# Patient Record
Sex: Male | Born: 1935 | Race: White | Hispanic: No | Marital: Married | Smoking: Never smoker
Health system: Southern US, Community
[De-identification: ages and names within clinical notes are randomized; demographics above are authoritative.]

## PROBLEM LIST (undated history)

## (undated) DIAGNOSIS — I2510 Atherosclerotic heart disease of native coronary artery without angina pectoris: Secondary | ICD-10-CM

## (undated) DIAGNOSIS — N529 Male erectile dysfunction, unspecified: Secondary | ICD-10-CM

## (undated) DIAGNOSIS — M1990 Unspecified osteoarthritis, unspecified site: Secondary | ICD-10-CM

## (undated) DIAGNOSIS — K5790 Diverticulosis of intestine, part unspecified, without perforation or abscess without bleeding: Secondary | ICD-10-CM

## (undated) DIAGNOSIS — E079 Disorder of thyroid, unspecified: Secondary | ICD-10-CM

## (undated) DIAGNOSIS — I10 Essential (primary) hypertension: Secondary | ICD-10-CM

## (undated) DIAGNOSIS — K449 Diaphragmatic hernia without obstruction or gangrene: Secondary | ICD-10-CM

## (undated) DIAGNOSIS — K649 Unspecified hemorrhoids: Secondary | ICD-10-CM

## (undated) DIAGNOSIS — L719 Rosacea, unspecified: Secondary | ICD-10-CM

## (undated) DIAGNOSIS — B351 Tinea unguium: Secondary | ICD-10-CM

## (undated) HISTORY — DX: Essential (primary) hypertension: I10

## (undated) HISTORY — DX: Tinea unguium: B35.1

## (undated) HISTORY — DX: Rosacea, unspecified: L71.9

## (undated) HISTORY — DX: Male erectile dysfunction, unspecified: N52.9

## (undated) HISTORY — DX: Diaphragmatic hernia without obstruction or gangrene: K44.9

## (undated) HISTORY — DX: Disorder of thyroid, unspecified: E07.9

## (undated) HISTORY — DX: Unspecified hemorrhoids: K64.9

## (undated) HISTORY — DX: Unspecified osteoarthritis, unspecified site: M19.90

## (undated) HISTORY — DX: Diverticulosis of intestine, part unspecified, without perforation or abscess without bleeding: K57.90

---

## 2011-08-24 ENCOUNTER — Other Ambulatory Visit (HOSPITAL_COMMUNITY): Payer: Self-pay | Admitting: *Deleted

## 2011-08-24 DIAGNOSIS — R942 Abnormal results of pulmonary function studies: Secondary | ICD-10-CM

## 2011-08-31 ENCOUNTER — Encounter (HOSPITAL_COMMUNITY): Payer: Self-pay

## 2014-05-10 ENCOUNTER — Encounter: Payer: Self-pay | Admitting: *Deleted

## 2014-07-09 DIAGNOSIS — L570 Actinic keratosis: Secondary | ICD-10-CM | POA: Diagnosis not present

## 2014-07-09 DIAGNOSIS — D049 Carcinoma in situ of skin, unspecified: Secondary | ICD-10-CM | POA: Diagnosis not present

## 2014-07-09 DIAGNOSIS — D239 Other benign neoplasm of skin, unspecified: Secondary | ICD-10-CM | POA: Diagnosis not present

## 2014-07-14 DIAGNOSIS — R12 Heartburn: Secondary | ICD-10-CM | POA: Diagnosis not present

## 2014-07-14 DIAGNOSIS — J45909 Unspecified asthma, uncomplicated: Secondary | ICD-10-CM | POA: Diagnosis not present

## 2014-07-14 DIAGNOSIS — R05 Cough: Secondary | ICD-10-CM | POA: Diagnosis not present

## 2014-08-04 DIAGNOSIS — D485 Neoplasm of uncertain behavior of skin: Secondary | ICD-10-CM | POA: Diagnosis not present

## 2014-09-29 DIAGNOSIS — Z0001 Encounter for general adult medical examination with abnormal findings: Secondary | ICD-10-CM | POA: Diagnosis not present

## 2014-09-29 DIAGNOSIS — J310 Chronic rhinitis: Secondary | ICD-10-CM | POA: Diagnosis not present

## 2014-09-29 DIAGNOSIS — R12 Heartburn: Secondary | ICD-10-CM | POA: Diagnosis not present

## 2014-09-29 DIAGNOSIS — R05 Cough: Secondary | ICD-10-CM | POA: Diagnosis not present

## 2014-09-29 DIAGNOSIS — I10 Essential (primary) hypertension: Secondary | ICD-10-CM | POA: Diagnosis not present

## 2014-09-29 DIAGNOSIS — B351 Tinea unguium: Secondary | ICD-10-CM | POA: Diagnosis not present

## 2014-09-29 DIAGNOSIS — E785 Hyperlipidemia, unspecified: Secondary | ICD-10-CM | POA: Diagnosis not present

## 2014-09-29 DIAGNOSIS — J45909 Unspecified asthma, uncomplicated: Secondary | ICD-10-CM | POA: Diagnosis not present

## 2014-10-20 DIAGNOSIS — E291 Testicular hypofunction: Secondary | ICD-10-CM | POA: Diagnosis not present

## 2014-11-24 ENCOUNTER — Other Ambulatory Visit: Payer: Self-pay | Admitting: Internal Medicine

## 2014-11-24 ENCOUNTER — Ambulatory Visit
Admission: RE | Admit: 2014-11-24 | Discharge: 2014-11-24 | Disposition: A | Discharge status: Home / Self Care | Payer: Commercial Managed Care - HMO | Source: Ambulatory Visit | Attending: Internal Medicine | Admitting: Internal Medicine

## 2014-11-24 DIAGNOSIS — R05 Cough: Secondary | ICD-10-CM | POA: Diagnosis not present

## 2014-11-24 DIAGNOSIS — R059 Cough, unspecified: Secondary | ICD-10-CM

## 2014-12-10 DIAGNOSIS — R05 Cough: Secondary | ICD-10-CM | POA: Diagnosis not present

## 2015-01-14 ENCOUNTER — Telehealth: Payer: Self-pay | Admitting: Internal Medicine

## 2015-01-14 NOTE — Telephone Encounter (Signed)
error 

## 2015-01-19 ENCOUNTER — Telehealth: Payer: Self-pay | Admitting: Internal Medicine

## 2015-01-19 ENCOUNTER — Encounter: Payer: Self-pay | Admitting: Internal Medicine

## 2015-01-19 ENCOUNTER — Ambulatory Visit (INDEPENDENT_AMBULATORY_CARE_PROVIDER_SITE_OTHER): Payer: Commercial Managed Care - HMO | Admitting: Internal Medicine

## 2015-01-19 VITALS — BP 122/84 | HR 60 | Ht 73.0 in | Wt 195.4 lb

## 2015-01-19 DIAGNOSIS — R05 Cough: Secondary | ICD-10-CM

## 2015-01-19 DIAGNOSIS — I10 Essential (primary) hypertension: Secondary | ICD-10-CM

## 2015-01-19 DIAGNOSIS — R058 Other specified cough: Secondary | ICD-10-CM

## 2015-01-19 MED ORDER — MOMETASONE FURO-FORMOTEROL FUM 100-5 MCG/ACT IN AERO: INHALATION_SPRAY | RESPIRATORY_TRACT | Status: DC

## 2015-01-19 MED ORDER — RANITIDINE HCL 150 MG PO TABS: ORAL_TABLET | ORAL | Status: DC

## 2015-01-19 NOTE — Progress Notes (Signed)
   Subjective:    Patient ID: Gary Riggs, male    DOB: 10/16/1930,    MRN: [id number]  HPI  7 yowm never smoker with indolent onset daily cough x 2012  referred by Dr Mertha Finders 01/19/2015 for pulmonary eval    01/19/2015 1st [facility] office visit/ Gary Riggs   Chief Complaint  Patient presents with  . Pulmonary Consult    Referred by Dr Inda Merlin. Pt c/o non-productive cough x 4 months. Pt states also having acid reflux and does not know if cough is related. Denies SOB, chest pain, tightness or wheezing    cough is daily worse with certain foods/ better with menthol lozenges  Cough is dry and worse around 3 am pretty much every noct  So far no resp to gerd rx/ zyrtec or symb 160    No obvious other patterns in day to day or daytime variabilty or assoc sob or cp or chest tightness, subjective wheeze overt sinus or hb symptoms. No unusual exp hx or h/o childhood pna/ asthma or knowledge of premature birth.  Sleeping ok without nocturnal  or early am exacerbation  of respiratory  c/o's or need for noct saba. Also denies any obvious fluctuation of symptoms with weather or environmental changes or other aggravating or alleviating factors except as outlined above   Current Medications, Allergies, Complete Past Medical History, Past Surgical History, Family History, and Social History were reviewed in [employer] record.                Review of Systems  Constitutional: Negative for fever, chills, activity change, appetite change and unexpected weight change.  HENT: Negative for congestion, dental problem, postnasal drip, rhinorrhea, sneezing, sore throat, trouble swallowing and voice change.   Eyes: Negative for visual disturbance.  Respiratory: Positive for cough. Negative for choking and shortness of breath.   Cardiovascular: Negative for chest pain and leg swelling.  Gastrointestinal: Negative for nausea, vomiting and abdominal pain.  Genitourinary:  Negative for difficulty urinating.  Musculoskeletal: Negative for arthralgias.  Skin: Negative for rash.  Psychiatric/Behavioral: Negative for behavioral problems and confusion.       Objective:   Physical Exam   amb talkative wm nad - extremely circumferential/ tangential responses to point of never answering the question asked  Wt Readings from Last 3 Encounters:  01/19/15 195 lb 6.4 oz (88.633 kg)    Vital signs reviewed   HEENT: nl dentition, turbinates, and orophanx. Nl external ear canals without cough reflex   NECK :  without JVD/Nodes/TM/ nl carotid upstrokes bilaterally   LUNGS: no acc muscle use, clear to A and P bilaterally without cough on insp or exp maneuvers   CV:  RRR  no s3 or murmur or increase in P2, no edema   ABD:  soft and nontender with nl excursion in the supine position. No bruits or organomegaly, bowel sounds nl  MS:  warm without deformities, calf tenderness, cyanosis or clubbing  SKIN: warm and dry without lesions    NEURO:  alert, approp, no deficits     I personally reviewed images and agree with radiology impression as follows:  CXR:  11/24/14 No active cardiopulmonary disease.           Assessment & Plan:

## 2015-01-19 NOTE — Patient Instructions (Addendum)
Resume pantoprazole 40 mg x 30-60 min before first meal of the day  And zantac 150 mg x 2 at bedtime until return   GERD (REFLUX)  is an extremely common cause of respiratory symptoms just like yours , many times with no obvious heartburn at all.    It can be treated with medication, but also with lifestyle changes including elevation of the head of your bed (ideally with 6 inch  bed blocks),  Smoking cessation, avoidance of late meals, excessive alcohol, and avoid fatty foods, chocolate, peppermint, colas, red wine, and acidic juices such as orange juice.  NO MINT OR MENTHOL PRODUCTS SO NO COUGH DROPS  USE SUGARLESS CANDY INSTEAD (Jolley ranchers or Stover's or Life Savers) or even ice chips will also do - the key is to swallow to prevent all throat clearing. NO OIL BASED VITAMINS - use powdered substitutes.  Stop symbicort and start dulera 100 Take 2 puffs first thing in am and then another 2 puffs about 12 hours later.   If all better > followup with Dr Inda Merlin if not return to see me for step 2

## 2015-01-20 ENCOUNTER — Encounter: Payer: Self-pay | Admitting: Internal Medicine

## 2015-01-20 DIAGNOSIS — R05 Cough: Secondary | ICD-10-CM | POA: Insufficient documentation

## 2015-01-20 DIAGNOSIS — R058 Other specified cough: Secondary | ICD-10-CM | POA: Insufficient documentation

## 2015-01-20 DIAGNOSIS — I10 Essential (primary) hypertension: Secondary | ICD-10-CM | POA: Insufficient documentation

## 2015-01-20 NOTE — Assessment & Plan Note (Signed)
For reasons that may related to vascular permability and nitric oxide pathways but not elevated  bradykinin levels (as seen with  ACEi use) losartan in the generic form has been reported now from mulitple sources  to cause a similar pattern of non-specific  upper airway symptoms as seen with acei.   This has not been reported with exposure to the other ARB's to date, so it seems reasonable if cough not gone on recs made today to try either generic diovan or avapro if ARB needed or use an alternative class altogether.    See:  Lelon Frohlich Allergy Asthma Immunol  2008: 101: p [phone]

## 2015-01-20 NOTE — Assessment & Plan Note (Addendum)
.The most common causes of chronic cough in immunocompetent adults include the following: upper airway cough syndrome (UACS), previously referred to as postnasal drip syndrome (PNDS), which is caused by variety of rhinosinus conditions; (2) asthma; (3) GERD; (4) chronic bronchitis from cigarette smoking or other inhaled environmental irritants; (5) nonasthmatic eosinophilic bronchitis; and (6) bronchiectasis.   These conditions, singly or in combination, have accounted for up to 94% of the causes of chronic cough in prospective studies.   Other conditions have constituted no >6% of the causes in prospective studies These have included bronchogenic carcinoma, chronic interstitial pneumonia, sarcoidosis, left ventricular failure, ACEI-induced cough, and aspiration from a condition associated with pharyngeal dysfunction.    Chronic cough is often simultaneously caused by more than one condition. A single cause has been found from 38 to 82% of the time, multiple causes from 18 to 62%. Multiply caused cough has been the result of three diseases up to 42% of the time.       Based on hx and exam, this is most likely:  Classic Upper airway cough syndrome, so named because it's frequently impossible to sort out how much is  CR/sinusitis with freq throat clearing (which can be related to primary GERD)   vs  causing  secondary (" extra esophageal")  GERD from wide swings in gastric pressure that occur with throat clearing, often  promoting self use of mint and menthol lozenges that reduce the lower esophageal sphincter tone and exacerbate the problem further in a cyclical fashion.   These are the same pts (now being labeled as having "irritable larynx syndrome" by some cough centers) who not infrequently have a history of having failed to tolerate ace inhibitors ( and now reported on losartan, see hbp) ,  dry powder inhalers or biphosphonates or report having atypical reflux symptoms that don't respond to standard  doses of PPI , and are easily confused as having aecopd or asthma flares by even experienced allergists/ pulmonologists.   The first step is to maximize acid suppression/GERD diet (which should exclude all mint and menthol products)  and reducing the ICS to (since I don't have sample of symbicort 80) dulera 1000 2bid if not stopping it altogether eventually as I doubt this is cough variant asthma but very hard to be sure here   I had an extended discussion with the patient reviewing all relevant studies completed to date and  lasting 35 min  1) Explained: The standardized cough guidelines published in Chest by Lissa Morales in 2006 are still the best available and consist of a multiple step process (up to 12!) , not a single office visit,  and are intended  to address this problem logically,  with an alogrithm dependent on response to empiric treatment at  each progressive step  to determine a specific diagnosis with  minimal addtional testing needed. Therefore if adherence is an issue or can't be accurately verified,  it's very unlikely the standard evaluation and treatment will be successful here.    Furthermore, response to therapy (other than acute cough suppression, which should only be used short term with avoidance of narcotic containing cough syrups if possible), can be a gradual process for which the patient may perceive immediate benefit.  Unlike going to an eye doctor where the best perscription is almost always the first one and is immediately effective, this is almost never the case in the management of chronic cough syndromes. Therefore the patient needs to commit up front to consistently adhere  to recommendations  for up to 6 weeks of therapy directed at the likely underlying problem(s) before the response can be reasonably evaluated.    2) The proper method of use, as well as anticipated side effects, of a metered-dose inhaler are discussed and demonstrated to the patient. Improved  effectiveness after extensive coaching during this visit to a level of approximately  75%   3) Each maintenance medication was reviewed in detail including most importantly the difference between maintenance and prns and under what circumstances the prns are to be triggered using an action plan format that is not reflected in the computer generated alphabetically organized AVS.    Please see instructions for details which were reviewed in writing and the patient given a copy highlighting the part that I personally wrote and discussed at today's ov.   See instructions for specific recommendations which were reviewed directly with the patient who was given a copy with highlighter outlining the key components.

## 2015-01-20 NOTE — Telephone Encounter (Signed)
Called and spoke to pt. Pt stated he was needing the protonix rx called into the pharmacy but then realized he had two additional refills on the medication and a new rx is no longer is needed. Pt denied needing any further assistance. Nothing further needed at this time. Will sign off.

## 2015-02-04 DIAGNOSIS — I10 Essential (primary) hypertension: Secondary | ICD-10-CM | POA: Diagnosis not present

## 2015-02-04 DIAGNOSIS — R05 Cough: Secondary | ICD-10-CM | POA: Diagnosis not present

## 2015-07-10 DIAGNOSIS — B351 Tinea unguium: Secondary | ICD-10-CM | POA: Diagnosis not present

## 2015-10-09 DIAGNOSIS — E039 Hypothyroidism, unspecified: Secondary | ICD-10-CM | POA: Diagnosis not present

## 2015-10-09 DIAGNOSIS — I10 Essential (primary) hypertension: Secondary | ICD-10-CM | POA: Diagnosis not present

## 2015-10-09 DIAGNOSIS — Z0001 Encounter for general adult medical examination with abnormal findings: Secondary | ICD-10-CM | POA: Diagnosis not present

## 2015-10-09 DIAGNOSIS — Z79899 Other long term (current) drug therapy: Secondary | ICD-10-CM | POA: Diagnosis not present

## 2015-10-09 DIAGNOSIS — E559 Vitamin D deficiency, unspecified: Secondary | ICD-10-CM | POA: Diagnosis not present

## 2015-10-09 DIAGNOSIS — R12 Heartburn: Secondary | ICD-10-CM | POA: Diagnosis not present

## 2015-10-09 DIAGNOSIS — B351 Tinea unguium: Secondary | ICD-10-CM | POA: Diagnosis not present

## 2015-10-09 DIAGNOSIS — L820 Inflamed seborrheic keratosis: Secondary | ICD-10-CM | POA: Diagnosis not present

## 2015-10-09 DIAGNOSIS — Z1389 Encounter for screening for other disorder: Secondary | ICD-10-CM | POA: Diagnosis not present

## 2015-10-09 DIAGNOSIS — R05 Cough: Secondary | ICD-10-CM | POA: Diagnosis not present

## 2015-10-09 DIAGNOSIS — E785 Hyperlipidemia, unspecified: Secondary | ICD-10-CM | POA: Diagnosis not present

## 2015-12-07 DIAGNOSIS — K219 Gastro-esophageal reflux disease without esophagitis: Secondary | ICD-10-CM | POA: Diagnosis not present

## 2016-02-10 DIAGNOSIS — H2513 Age-related nuclear cataract, bilateral: Secondary | ICD-10-CM | POA: Diagnosis not present

## 2016-02-15 DIAGNOSIS — R21 Rash and other nonspecific skin eruption: Secondary | ICD-10-CM | POA: Diagnosis not present

## 2016-02-15 DIAGNOSIS — J45909 Unspecified asthma, uncomplicated: Secondary | ICD-10-CM | POA: Diagnosis not present

## 2016-02-15 DIAGNOSIS — R05 Cough: Secondary | ICD-10-CM | POA: Diagnosis not present

## 2016-02-15 DIAGNOSIS — R062 Wheezing: Secondary | ICD-10-CM | POA: Diagnosis not present

## 2016-02-16 ENCOUNTER — Other Ambulatory Visit: Payer: Self-pay | Admitting: Internal Medicine

## 2016-02-16 ENCOUNTER — Ambulatory Visit
Admission: RE | Admit: 2016-02-16 | Discharge: 2016-02-16 | Disposition: A | Discharge status: Home / Self Care | Payer: Commercial Managed Care - HMO | Source: Ambulatory Visit | Attending: Internal Medicine | Admitting: Internal Medicine

## 2016-02-16 DIAGNOSIS — R062 Wheezing: Secondary | ICD-10-CM

## 2016-02-16 DIAGNOSIS — J9811 Atelectasis: Secondary | ICD-10-CM | POA: Diagnosis not present

## 2016-02-19 DIAGNOSIS — R05 Cough: Secondary | ICD-10-CM | POA: Diagnosis not present

## 2016-02-19 DIAGNOSIS — J45909 Unspecified asthma, uncomplicated: Secondary | ICD-10-CM | POA: Diagnosis not present

## 2016-02-19 DIAGNOSIS — J9811 Atelectasis: Secondary | ICD-10-CM | POA: Diagnosis not present

## 2016-02-24 DIAGNOSIS — R21 Rash and other nonspecific skin eruption: Secondary | ICD-10-CM | POA: Diagnosis not present

## 2016-02-24 DIAGNOSIS — D225 Melanocytic nevi of trunk: Secondary | ICD-10-CM | POA: Diagnosis not present

## 2016-02-24 DIAGNOSIS — L821 Other seborrheic keratosis: Secondary | ICD-10-CM | POA: Diagnosis not present

## 2016-02-24 DIAGNOSIS — L570 Actinic keratosis: Secondary | ICD-10-CM | POA: Diagnosis not present

## 2016-10-03 DIAGNOSIS — R05 Cough: Secondary | ICD-10-CM | POA: Diagnosis not present

## 2016-11-14 DIAGNOSIS — E039 Hypothyroidism, unspecified: Secondary | ICD-10-CM | POA: Diagnosis not present

## 2016-11-14 DIAGNOSIS — B351 Tinea unguium: Secondary | ICD-10-CM | POA: Diagnosis not present

## 2016-11-14 DIAGNOSIS — I10 Essential (primary) hypertension: Secondary | ICD-10-CM | POA: Diagnosis not present

## 2016-11-14 DIAGNOSIS — E785 Hyperlipidemia, unspecified: Secondary | ICD-10-CM | POA: Diagnosis not present

## 2016-11-14 DIAGNOSIS — Z0001 Encounter for general adult medical examination with abnormal findings: Secondary | ICD-10-CM | POA: Diagnosis not present

## 2016-11-14 DIAGNOSIS — G459 Transient cerebral ischemic attack, unspecified: Secondary | ICD-10-CM | POA: Diagnosis not present

## 2016-11-14 DIAGNOSIS — Z79899 Other long term (current) drug therapy: Secondary | ICD-10-CM | POA: Diagnosis not present

## 2016-11-14 DIAGNOSIS — R12 Heartburn: Secondary | ICD-10-CM | POA: Diagnosis not present

## 2016-11-14 DIAGNOSIS — R21 Rash and other nonspecific skin eruption: Secondary | ICD-10-CM | POA: Diagnosis not present

## 2016-11-14 DIAGNOSIS — J45909 Unspecified asthma, uncomplicated: Secondary | ICD-10-CM | POA: Diagnosis not present

## 2016-11-14 DIAGNOSIS — Z1389 Encounter for screening for other disorder: Secondary | ICD-10-CM | POA: Diagnosis not present

## 2017-03-27 DIAGNOSIS — H524 Presbyopia: Secondary | ICD-10-CM | POA: Diagnosis not present

## 2017-03-27 DIAGNOSIS — H2513 Age-related nuclear cataract, bilateral: Secondary | ICD-10-CM | POA: Diagnosis not present

## 2017-03-27 DIAGNOSIS — H5203 Hypermetropia, bilateral: Secondary | ICD-10-CM | POA: Diagnosis not present

## 2017-04-26 DIAGNOSIS — J209 Acute bronchitis, unspecified: Secondary | ICD-10-CM | POA: Diagnosis not present

## 2017-04-26 DIAGNOSIS — J45909 Unspecified asthma, uncomplicated: Secondary | ICD-10-CM | POA: Diagnosis not present

## 2017-11-13 DIAGNOSIS — R0602 Shortness of breath: Secondary | ICD-10-CM | POA: Diagnosis not present

## 2017-11-22 ENCOUNTER — Other Ambulatory Visit: Payer: Self-pay | Admitting: Internal Medicine

## 2017-11-22 ENCOUNTER — Ambulatory Visit
Admission: RE | Admit: 2017-11-22 | Discharge: 2017-11-22 | Disposition: A | Discharge status: Home / Self Care | Payer: Medicare HMO | Source: Ambulatory Visit | Attending: Internal Medicine | Admitting: Internal Medicine

## 2017-11-22 DIAGNOSIS — R053 Chronic cough: Secondary | ICD-10-CM

## 2017-11-22 DIAGNOSIS — E785 Hyperlipidemia, unspecified: Secondary | ICD-10-CM | POA: Diagnosis not present

## 2017-11-22 DIAGNOSIS — J45909 Unspecified asthma, uncomplicated: Secondary | ICD-10-CM | POA: Diagnosis not present

## 2017-11-22 DIAGNOSIS — Z1389 Encounter for screening for other disorder: Secondary | ICD-10-CM | POA: Diagnosis not present

## 2017-11-22 DIAGNOSIS — R05 Cough: Secondary | ICD-10-CM | POA: Diagnosis not present

## 2017-11-22 DIAGNOSIS — R0602 Shortness of breath: Secondary | ICD-10-CM | POA: Diagnosis not present

## 2017-11-22 DIAGNOSIS — E039 Hypothyroidism, unspecified: Secondary | ICD-10-CM | POA: Diagnosis not present

## 2017-11-22 DIAGNOSIS — Z0000 Encounter for general adult medical examination without abnormal findings: Secondary | ICD-10-CM | POA: Diagnosis not present

## 2017-11-22 DIAGNOSIS — L603 Nail dystrophy: Secondary | ICD-10-CM | POA: Diagnosis not present

## 2017-11-22 DIAGNOSIS — I10 Essential (primary) hypertension: Secondary | ICD-10-CM | POA: Diagnosis not present

## 2017-11-22 DIAGNOSIS — E559 Vitamin D deficiency, unspecified: Secondary | ICD-10-CM | POA: Diagnosis not present

## 2017-12-05 ENCOUNTER — Encounter: Payer: Self-pay | Admitting: Podiatry

## 2017-12-05 ENCOUNTER — Ambulatory Visit (INDEPENDENT_AMBULATORY_CARE_PROVIDER_SITE_OTHER): Payer: Medicare HMO | Admitting: Podiatry

## 2017-12-05 VITALS — BP 155/88 | HR 65 | Resp 16

## 2017-12-05 DIAGNOSIS — L603 Nail dystrophy: Secondary | ICD-10-CM | POA: Diagnosis not present

## 2017-12-05 DIAGNOSIS — B351 Tinea unguium: Secondary | ICD-10-CM | POA: Diagnosis not present

## 2017-12-05 NOTE — Progress Notes (Signed)
  Subjective:  Patient ID: Gary Riggs, male    DOB: 04/23/1931,  MRN: [id number] HPI Chief Complaint  Patient presents with  . Nail Problem    Hallux nails bilateral (R>L) - thick, discolored, brittle nails x years, "anything to do to make them look normal again?"  . New Patient (Initial Visit)    84 y.o. male presents with the above complaint.   ROS: Denies fever chills nausea vomiting muscle aches pains calf pain back pain chest pain shortness of breath.  Past Medical History:  Diagnosis Date  . Diverticulosis   . DJD (degenerative joint disease)   . ED (erectile dysfunction)   . Hemorrhoids   . Hiatal hernia   . Hypertension   . Onychomycosis   . Rosacea   . Thyroid disease    No past surgical history on file.  Current Outpatient Medications:  .  Ascorbic Acid (VITAMIN C) 1000 MG tablet, Take 1,000 mg by mouth daily., Disp: , Rfl:  .  aspirin 81 MG chewable tablet, Chew by mouth daily., Disp: , Rfl:  .  Cholecalciferol (VITAMIN D-3) 1000 UNITS CAPS, Take by mouth., Disp: , Rfl:  .  levothyroxine (SYNTHROID, LEVOTHROID) 150 MCG tablet, Take 150 mcg by mouth daily before breakfast., Disp: , Rfl:  .  losartan-hydrochlorothiazide (HYZAAR) 100-12.5 MG per tablet, Take 1 tablet by mouth daily., Disp: , Rfl:  .  Multiple Vitamin (MULTIVITAMIN) capsule, Take 1 capsule by mouth daily., Disp: , Rfl:  .  simvastatin (ZOCOR) 40 MG tablet, Take 40 mg by mouth daily., Disp: , Rfl:  .  vitamin B-12 (CYANOCOBALAMIN) 100 MCG tablet, Take 100 mcg by mouth daily., Disp: , Rfl:   Allergies  Allergen Reactions  . Aleve [Naproxen Sodium]     angioedema  . Synthroid [Levothyroxine Sodium]     Not tolerated name brand  . Tussionex Pennkinetic Er [Hydrocod Polst-Cpm Polst Er]     swelling   Review of Systems Objective:   Vitals:   12/05/17 0907  BP: (!) 155/88  Pulse: 65  Resp: 16    General: Well developed, nourished, in no acute distress, alert and oriented x3    Dermatological: Skin is warm, dry and supple bilateral. Nails x 10 are well maintained; remaining integument appears unremarkable at this time. There are no open sores, no preulcerative lesions, no rash or signs of infection present.  Toenails appear to be thicker yellow dystrophic brittle and painful.  Vascular: Dorsalis Pedis artery and Posterior Tibial artery pedal pulses are 2/4 bilateral with immedate capillary fill time. Pedal hair growth present. No varicosities and no lower extremity edema present bilateral.   Neruologic: Grossly intact via light touch bilateral. Vibratory intact via tuning fork bilateral. Protective threshold with Semmes Wienstein monofilament intact to all pedal sites bilateral. Patellar and Achilles deep tendon reflexes 2+ bilateral. No Babinski or clonus noted bilateral.   Musculoskeletal: No gross boney pedal deformities bilateral. No pain, crepitus, or limitation noted with foot and ankle range of motion bilateral. Muscular strength 5/5 in all groups tested bilateral.  Gait: Unassisted, Nonantalgic.    Radiographs:  None taken  Assessment & Plan:   Assessment: Nail dystrophy bilateral.  Plan: Samples of his toenails and skin were taken today for pathologic evaluation.  I will follow-up with him in 1 month     Max T. [place], [place]

## 2018-01-02 ENCOUNTER — Encounter: Payer: Self-pay | Admitting: Podiatry

## 2018-01-02 ENCOUNTER — Ambulatory Visit: Payer: Medicare HMO | Admitting: Podiatry

## 2018-01-02 DIAGNOSIS — Z79899 Other long term (current) drug therapy: Secondary | ICD-10-CM

## 2018-01-02 DIAGNOSIS — L603 Nail dystrophy: Secondary | ICD-10-CM | POA: Diagnosis not present

## 2018-01-02 LAB — HEPATIC FUNCTION PANEL
AG RATIO: 1.7 (calc) (ref 1.0–2.5)
ALKALINE PHOSPHATASE (APISO): 62 U/L (ref 40–115)
ALT: 17 U/L (ref 9–46)
AST: 20 U/L (ref 10–35)
Albumin: 4.3 g/dL (ref 3.6–5.1)
BILIRUBIN TOTAL: 0.4 mg/dL (ref 0.2–1.2)
Bilirubin, Direct: 0.1 mg/dL (ref 0.0–0.2)
GLOBULIN: 2.5 g/dL (ref 1.9–3.7)
Indirect Bilirubin: 0.3 mg/dL (calc) (ref 0.2–1.2)
Total Protein: 6.8 g/dL (ref 6.1–8.1)

## 2018-01-02 MED ORDER — TERBINAFINE HCL 250 MG PO TABS: 250.0000 mg | ORAL_TABLET | Freq: Every day | ORAL | 0 refills | Status: DC

## 2018-01-02 NOTE — Patient Instructions (Signed)

## 2018-01-02 NOTE — Progress Notes (Signed)
He presents today for follow-up of his pathology results regarding onychomycosis.  Objective: Pathology report does demonstrate onychomycosis.  Assessment: Onychomycosis.  Plan: At this point he would like to consider oral therapy.  We did discuss laser therapy.  He would like to consider little oral therapy at this time consisting of Lamisil therapy 250 mg tablets 1 p.o. daily.  He has no history of liver problems nor is taking any medication that would disturb liver function.  At this point we did request a liver profile dispensed that to him today.  Also dispensed 30 tablets of Lamisil.  Follow-up with him in 1 month for another set of liver function test.

## 2018-01-02 NOTE — Addendum Note (Signed)
Addended by: Clovis Riley E on: 01/02/2018 01:14 PM   Modules accepted: Orders

## 2018-01-03 ENCOUNTER — Telehealth: Payer: Self-pay | Admitting: *Deleted

## 2018-01-03 MED ORDER — TERBINAFINE HCL 250 MG PO TABS: 250.0000 mg | ORAL_TABLET | Freq: Every day | ORAL | 0 refills | Status: DC

## 2018-01-03 NOTE — Telephone Encounter (Signed)
-----   Message from Garrel Ridgel, [place] sent at 01/03/2018  6:59 AM EDT ----- Blood work looks great may continue medication.

## 2018-01-03 NOTE — Telephone Encounter (Signed)
Left message informing pt of Dr. Hyatt's review of results and orders. 

## 2018-01-05 NOTE — Telephone Encounter (Signed)
Left message informing him I had received his message that he was having problems with [facility], and I had received a pharmacy order for from [facility], if he wanted me to complete the form call and inform.

## 2018-01-05 NOTE — Telephone Encounter (Signed)
Faxed required form to [place] for terbinafine.

## 2018-01-05 NOTE — Telephone Encounter (Signed)
Pt states he is having a situation with  [facility] and his prescription.

## 2018-01-30 ENCOUNTER — Encounter: Payer: Self-pay | Admitting: Podiatry

## 2018-01-30 ENCOUNTER — Ambulatory Visit: Payer: Medicare HMO | Admitting: Podiatry

## 2018-01-30 DIAGNOSIS — L603 Nail dystrophy: Secondary | ICD-10-CM | POA: Diagnosis not present

## 2018-01-30 DIAGNOSIS — Z79899 Other long term (current) drug therapy: Secondary | ICD-10-CM

## 2018-01-30 MED ORDER — TERBINAFINE HCL 250 MG PO TABS: 250.0000 mg | ORAL_TABLET | Freq: Every day | ORAL | 0 refills | Status: DC

## 2018-01-31 NOTE — Progress Notes (Signed)
He presents today for follow-up of his nail fungus and he has completed almost 30 days of Lamisil therapy with no side effects.  He states that he was doing well without complications.  Objective: No change in physical exam.  Assessment onychomycosis long-term therapy with Lamisil.  Plan: Wrote another prescription for Lamisil 250 mg tablets 1 p.o. daily x90.  And also went ahead and wrote a requisition for liver profile should this come back abnormal I will notify him immediately.

## 2018-02-02 DIAGNOSIS — Z79899 Other long term (current) drug therapy: Secondary | ICD-10-CM | POA: Diagnosis not present

## 2018-02-02 LAB — HEPATIC FUNCTION PANEL
AG Ratio: 1.5 (calc) (ref 1.0–2.5)
ALKALINE PHOSPHATASE (APISO): 50 U/L (ref 40–115)
ALT: 18 U/L (ref 9–46)
AST: 24 U/L (ref 10–35)
Albumin: 4.1 g/dL (ref 3.6–5.1)
BILIRUBIN INDIRECT: 0.3 mg/dL (ref 0.2–1.2)
Bilirubin, Direct: 0.1 mg/dL (ref 0.0–0.2)
Globulin: 2.7 g/dL (calc) (ref 1.9–3.7)
TOTAL PROTEIN: 6.8 g/dL (ref 6.1–8.1)
Total Bilirubin: 0.4 mg/dL (ref 0.2–1.2)

## 2018-02-07 ENCOUNTER — Telehealth: Payer: Self-pay | Admitting: *Deleted

## 2018-02-07 NOTE — Telephone Encounter (Signed)
-----   Message from Garrel Ridgel, [place] sent at 02/07/2018  7:28 AM EDT ----- Blood work looks perfect and should continue medication.

## 2018-02-07 NOTE — Telephone Encounter (Signed)
Left message informing pt of Dr. Hyatt's review of results and orders. 

## 2018-03-20 ENCOUNTER — Ambulatory Visit: Payer: Medicare HMO | Admitting: Podiatry

## 2018-03-20 ENCOUNTER — Encounter: Payer: Self-pay | Admitting: Podiatry

## 2018-03-20 DIAGNOSIS — L603 Nail dystrophy: Secondary | ICD-10-CM | POA: Diagnosis not present

## 2018-03-20 MED ORDER — TERBINAFINE HCL 250 MG PO TABS: 250.0000 mg | ORAL_TABLET | Freq: Every day | ORAL | 0 refills | Status: DC

## 2018-03-21 NOTE — Progress Notes (Signed)
He presents today for follow-up of his nail fungus patient has not completed the prescription of 90 days has really only completed 60 days or there about.  He states that I really cannot see any improvement yet of the toenails he does state however his fingernails have started to clear up.  He denies any problems taking the medication.  Objective: Vital signs are stable alert and oriented x3 toenails appear to be clearing minimally proximally however due to his picking up his toenails I imagine these will be very slow to grow out.  Assessment: Onychomycosis.  Plan: Continue long-term therapy another 60 days of Lamisil follow-up with him in about 3 to 4 months

## 2018-06-12 ENCOUNTER — Ambulatory Visit: Payer: Medicare HMO | Admitting: Podiatry

## 2018-06-12 DIAGNOSIS — L603 Nail dystrophy: Secondary | ICD-10-CM

## 2018-06-13 NOTE — Progress Notes (Signed)
He presents today for follow-up of his Lamisil therapy for onychomycosis.  He states that he really does not see any improvement he just must be too damaged to improve.  Objective: Vital signs stable he is alert oriented x3 nails remain thick discolored do not appear to be growing at all and have not improved at all over the past 3 months or so of treatment.  Assessment: Nail dystrophy hallux bilateral.  Plan: Debridement of nails today 1 through 5 bilaterally.  Continue to debris the nails for him on a regular basis.

## 2018-07-16 DIAGNOSIS — G459 Transient cerebral ischemic attack, unspecified: Secondary | ICD-10-CM | POA: Diagnosis not present

## 2018-07-16 DIAGNOSIS — E039 Hypothyroidism, unspecified: Secondary | ICD-10-CM | POA: Diagnosis not present

## 2018-07-16 DIAGNOSIS — E785 Hyperlipidemia, unspecified: Secondary | ICD-10-CM | POA: Diagnosis not present

## 2018-07-16 DIAGNOSIS — J45909 Unspecified asthma, uncomplicated: Secondary | ICD-10-CM | POA: Diagnosis not present

## 2018-07-16 DIAGNOSIS — I10 Essential (primary) hypertension: Secondary | ICD-10-CM | POA: Diagnosis not present

## 2018-07-18 DIAGNOSIS — R140 Abdominal distension (gaseous): Secondary | ICD-10-CM | POA: Diagnosis not present

## 2018-07-18 DIAGNOSIS — J45909 Unspecified asthma, uncomplicated: Secondary | ICD-10-CM | POA: Diagnosis not present

## 2018-07-18 DIAGNOSIS — E785 Hyperlipidemia, unspecified: Secondary | ICD-10-CM | POA: Diagnosis not present

## 2018-07-18 DIAGNOSIS — I10 Essential (primary) hypertension: Secondary | ICD-10-CM | POA: Diagnosis not present

## 2018-07-18 DIAGNOSIS — E559 Vitamin D deficiency, unspecified: Secondary | ICD-10-CM | POA: Diagnosis not present

## 2018-07-18 DIAGNOSIS — K219 Gastro-esophageal reflux disease without esophagitis: Secondary | ICD-10-CM | POA: Diagnosis not present

## 2018-07-18 DIAGNOSIS — E039 Hypothyroidism, unspecified: Secondary | ICD-10-CM | POA: Diagnosis not present

## 2018-10-11 DIAGNOSIS — G459 Transient cerebral ischemic attack, unspecified: Secondary | ICD-10-CM | POA: Diagnosis not present

## 2018-10-11 DIAGNOSIS — J45909 Unspecified asthma, uncomplicated: Secondary | ICD-10-CM | POA: Diagnosis not present

## 2018-10-11 DIAGNOSIS — E785 Hyperlipidemia, unspecified: Secondary | ICD-10-CM | POA: Diagnosis not present

## 2018-10-11 DIAGNOSIS — I10 Essential (primary) hypertension: Secondary | ICD-10-CM | POA: Diagnosis not present

## 2018-10-11 DIAGNOSIS — E039 Hypothyroidism, unspecified: Secondary | ICD-10-CM | POA: Diagnosis not present

## 2018-12-10 DIAGNOSIS — Z0001 Encounter for general adult medical examination with abnormal findings: Secondary | ICD-10-CM | POA: Diagnosis not present

## 2018-12-10 DIAGNOSIS — G459 Transient cerebral ischemic attack, unspecified: Secondary | ICD-10-CM | POA: Diagnosis not present

## 2018-12-10 DIAGNOSIS — E785 Hyperlipidemia, unspecified: Secondary | ICD-10-CM | POA: Diagnosis not present

## 2018-12-10 DIAGNOSIS — J45909 Unspecified asthma, uncomplicated: Secondary | ICD-10-CM | POA: Diagnosis not present

## 2018-12-10 DIAGNOSIS — E039 Hypothyroidism, unspecified: Secondary | ICD-10-CM | POA: Diagnosis not present

## 2018-12-10 DIAGNOSIS — E559 Vitamin D deficiency, unspecified: Secondary | ICD-10-CM | POA: Diagnosis not present

## 2018-12-10 DIAGNOSIS — Z1389 Encounter for screening for other disorder: Secondary | ICD-10-CM | POA: Diagnosis not present

## 2018-12-10 DIAGNOSIS — I10 Essential (primary) hypertension: Secondary | ICD-10-CM | POA: Diagnosis not present

## 2019-01-29 DIAGNOSIS — G459 Transient cerebral ischemic attack, unspecified: Secondary | ICD-10-CM | POA: Diagnosis not present

## 2019-01-29 DIAGNOSIS — J45909 Unspecified asthma, uncomplicated: Secondary | ICD-10-CM | POA: Diagnosis not present

## 2019-01-29 DIAGNOSIS — E785 Hyperlipidemia, unspecified: Secondary | ICD-10-CM | POA: Diagnosis not present

## 2019-01-29 DIAGNOSIS — I10 Essential (primary) hypertension: Secondary | ICD-10-CM | POA: Diagnosis not present

## 2019-01-29 DIAGNOSIS — E039 Hypothyroidism, unspecified: Secondary | ICD-10-CM | POA: Diagnosis not present

## 2019-03-05 DIAGNOSIS — G459 Transient cerebral ischemic attack, unspecified: Secondary | ICD-10-CM | POA: Diagnosis not present

## 2019-03-05 DIAGNOSIS — I10 Essential (primary) hypertension: Secondary | ICD-10-CM | POA: Diagnosis not present

## 2019-03-05 DIAGNOSIS — E039 Hypothyroidism, unspecified: Secondary | ICD-10-CM | POA: Diagnosis not present

## 2019-03-05 DIAGNOSIS — J45909 Unspecified asthma, uncomplicated: Secondary | ICD-10-CM | POA: Diagnosis not present

## 2019-03-05 DIAGNOSIS — E785 Hyperlipidemia, unspecified: Secondary | ICD-10-CM | POA: Diagnosis not present

## 2019-03-26 IMAGING — CR DG CHEST 2V
2 series · 2 of 2 positions shown · non-contrast
Comparison: Chest x-ray of February 16, 2016

CLINICAL DATA: One month of productive cough. History of
hypertension.

EXAM:
CHEST - 2 VIEW

[w chest pa]
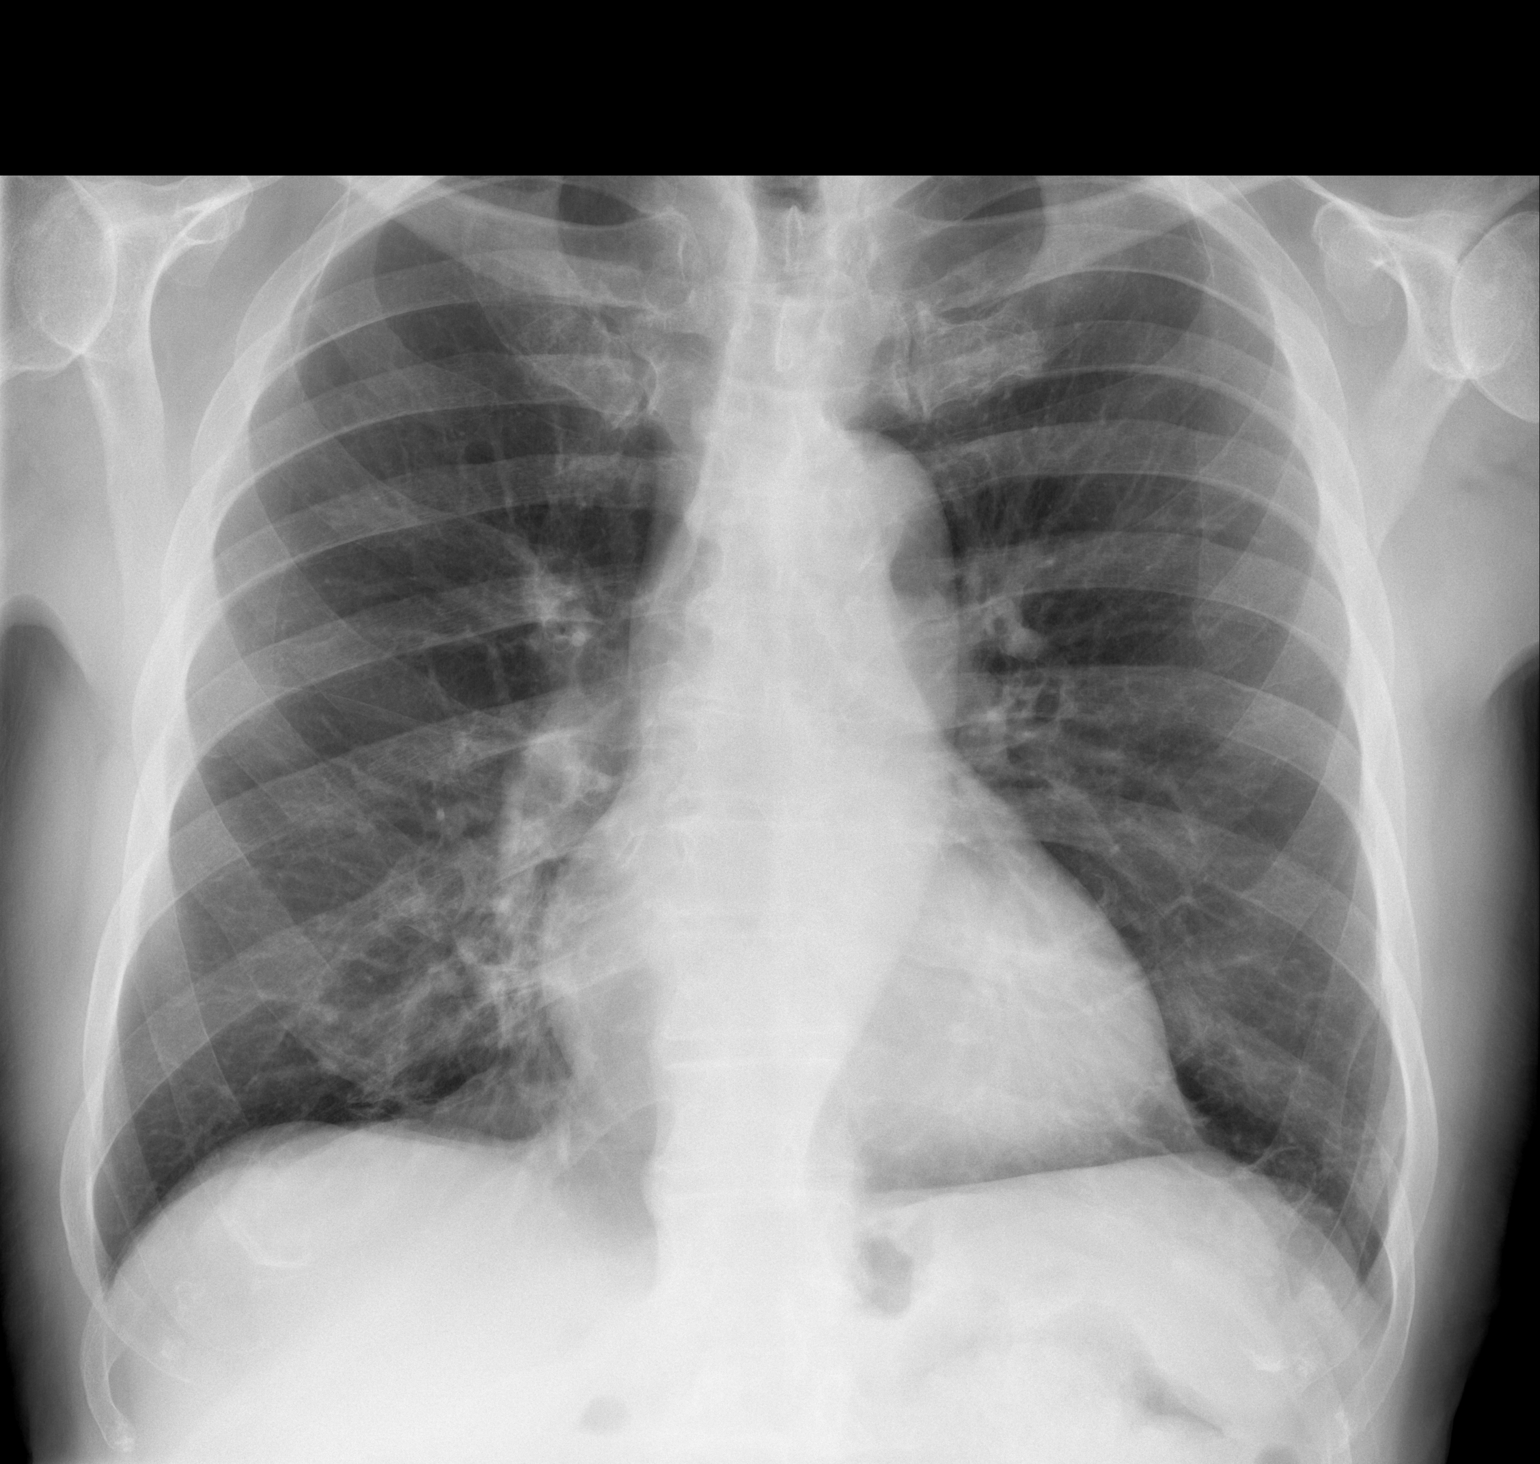

[w chest lat]
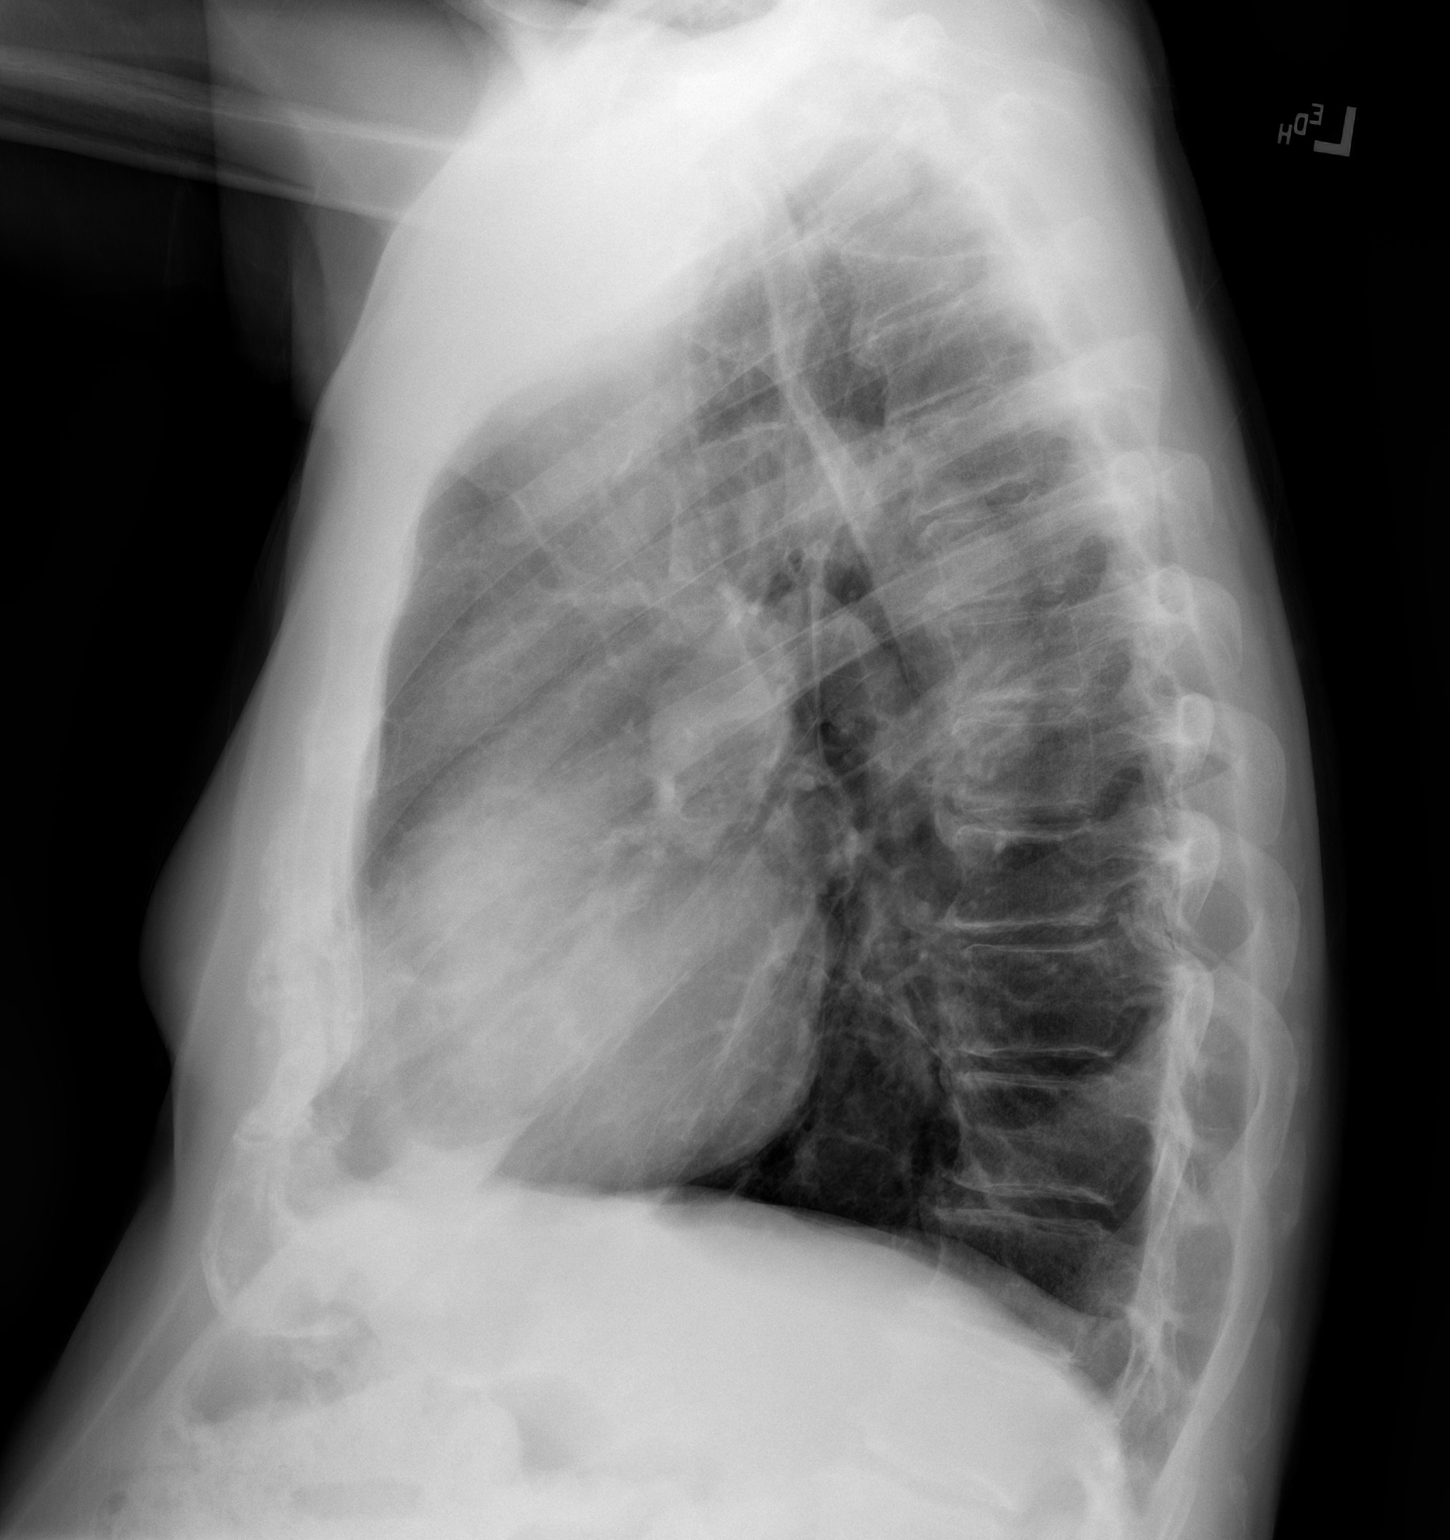

[2 of 2 positions shown; findings below may reference images not displayed]

FINDINGS: The lungs are mildly hyperinflated with hemidiaphragm flattening..
The lung markings are coarse in the right infrahilar region but
appear stable. There is no air bronchogram. There is no pleural
effusion. The heart and pulmonary vascularity are normal. There is
calcification in the wall of the aortic arch. There is tortuosity of
the descending thoracic aorta. The bony thorax exhibits no acute
abnormality.
IMPRESSION: Chronic bronchitic-reactive airway changes. Stable increased
interstitial markings in the right infrahilar region may reflect
underlying bronchiectasis. No alveolar pneumonia nor CHF.

Thoracic aortic atherosclerosis.

## 2019-06-04 DIAGNOSIS — G459 Transient cerebral ischemic attack, unspecified: Secondary | ICD-10-CM | POA: Diagnosis not present

## 2019-06-04 DIAGNOSIS — E785 Hyperlipidemia, unspecified: Secondary | ICD-10-CM | POA: Diagnosis not present

## 2019-06-04 DIAGNOSIS — E039 Hypothyroidism, unspecified: Secondary | ICD-10-CM | POA: Diagnosis not present

## 2019-06-04 DIAGNOSIS — J45909 Unspecified asthma, uncomplicated: Secondary | ICD-10-CM | POA: Diagnosis not present

## 2019-06-04 DIAGNOSIS — I10 Essential (primary) hypertension: Secondary | ICD-10-CM | POA: Diagnosis not present

## 2019-07-01 DIAGNOSIS — J45909 Unspecified asthma, uncomplicated: Secondary | ICD-10-CM | POA: Diagnosis not present

## 2019-07-01 DIAGNOSIS — G459 Transient cerebral ischemic attack, unspecified: Secondary | ICD-10-CM | POA: Diagnosis not present

## 2019-07-01 DIAGNOSIS — E785 Hyperlipidemia, unspecified: Secondary | ICD-10-CM | POA: Diagnosis not present

## 2019-07-01 DIAGNOSIS — E039 Hypothyroidism, unspecified: Secondary | ICD-10-CM | POA: Diagnosis not present

## 2019-07-01 DIAGNOSIS — I10 Essential (primary) hypertension: Secondary | ICD-10-CM | POA: Diagnosis not present

## 2019-07-29 DIAGNOSIS — R0789 Other chest pain: Secondary | ICD-10-CM | POA: Diagnosis not present

## 2019-08-01 DIAGNOSIS — I10 Essential (primary) hypertension: Secondary | ICD-10-CM | POA: Diagnosis not present

## 2019-08-01 DIAGNOSIS — E039 Hypothyroidism, unspecified: Secondary | ICD-10-CM | POA: Diagnosis not present

## 2019-08-01 DIAGNOSIS — G459 Transient cerebral ischemic attack, unspecified: Secondary | ICD-10-CM | POA: Diagnosis not present

## 2019-08-01 DIAGNOSIS — E785 Hyperlipidemia, unspecified: Secondary | ICD-10-CM | POA: Diagnosis not present

## 2019-08-01 DIAGNOSIS — J45909 Unspecified asthma, uncomplicated: Secondary | ICD-10-CM | POA: Diagnosis not present

## 2019-09-11 DIAGNOSIS — I10 Essential (primary) hypertension: Secondary | ICD-10-CM | POA: Diagnosis not present

## 2019-09-11 DIAGNOSIS — E039 Hypothyroidism, unspecified: Secondary | ICD-10-CM | POA: Diagnosis not present

## 2019-09-11 DIAGNOSIS — E785 Hyperlipidemia, unspecified: Secondary | ICD-10-CM | POA: Diagnosis not present

## 2019-09-11 DIAGNOSIS — J45909 Unspecified asthma, uncomplicated: Secondary | ICD-10-CM | POA: Diagnosis not present

## 2019-09-11 DIAGNOSIS — G459 Transient cerebral ischemic attack, unspecified: Secondary | ICD-10-CM | POA: Diagnosis not present

## 2019-10-25 DIAGNOSIS — G459 Transient cerebral ischemic attack, unspecified: Secondary | ICD-10-CM | POA: Diagnosis not present

## 2019-10-25 DIAGNOSIS — E785 Hyperlipidemia, unspecified: Secondary | ICD-10-CM | POA: Diagnosis not present

## 2019-10-25 DIAGNOSIS — E039 Hypothyroidism, unspecified: Secondary | ICD-10-CM | POA: Diagnosis not present

## 2019-10-25 DIAGNOSIS — J45909 Unspecified asthma, uncomplicated: Secondary | ICD-10-CM | POA: Diagnosis not present

## 2019-10-25 DIAGNOSIS — I10 Essential (primary) hypertension: Secondary | ICD-10-CM | POA: Diagnosis not present

## 2019-12-31 DIAGNOSIS — I10 Essential (primary) hypertension: Secondary | ICD-10-CM | POA: Diagnosis not present

## 2019-12-31 DIAGNOSIS — K219 Gastro-esophageal reflux disease without esophagitis: Secondary | ICD-10-CM | POA: Diagnosis not present

## 2019-12-31 DIAGNOSIS — E039 Hypothyroidism, unspecified: Secondary | ICD-10-CM | POA: Diagnosis not present

## 2019-12-31 DIAGNOSIS — Z79899 Other long term (current) drug therapy: Secondary | ICD-10-CM | POA: Diagnosis not present

## 2019-12-31 DIAGNOSIS — E559 Vitamin D deficiency, unspecified: Secondary | ICD-10-CM | POA: Diagnosis not present

## 2019-12-31 DIAGNOSIS — J45909 Unspecified asthma, uncomplicated: Secondary | ICD-10-CM | POA: Diagnosis not present

## 2019-12-31 DIAGNOSIS — L603 Nail dystrophy: Secondary | ICD-10-CM | POA: Diagnosis not present

## 2019-12-31 DIAGNOSIS — G459 Transient cerebral ischemic attack, unspecified: Secondary | ICD-10-CM | POA: Diagnosis not present

## 2019-12-31 DIAGNOSIS — E785 Hyperlipidemia, unspecified: Secondary | ICD-10-CM | POA: Diagnosis not present

## 2020-01-06 ENCOUNTER — Ambulatory Visit: Payer: Medicare HMO | Admitting: Podiatry

## 2020-01-09 ENCOUNTER — Ambulatory Visit: Payer: Medicare HMO | Admitting: Podiatry

## 2020-01-15 ENCOUNTER — Other Ambulatory Visit: Payer: Self-pay

## 2020-01-15 ENCOUNTER — Ambulatory Visit: Payer: Medicare HMO | Admitting: Podiatry

## 2020-01-15 ENCOUNTER — Encounter: Payer: Self-pay | Admitting: Podiatry

## 2020-01-15 DIAGNOSIS — M79674 Pain in right toe(s): Secondary | ICD-10-CM

## 2020-01-15 DIAGNOSIS — B351 Tinea unguium: Secondary | ICD-10-CM

## 2020-01-15 DIAGNOSIS — M79675 Pain in left toe(s): Secondary | ICD-10-CM

## 2020-01-21 NOTE — Progress Notes (Signed)
Subjective:   Patient ID: Gary Riggs, male   DOB: 86 y.o.   MRN: [id number]   HPI Patient presents with nail disease 1-5 both feet that are thick and he cannot take care of and he wants to nose or anything that can fix this   ROS      Objective:  Physical Exam  Neurovascular status intact with patient noted to have thick yellow brittle toenails 1-5 both feet with a history of having take Lamisil which only gave him slight improvement but no significant change     Assessment:  Chronic mycotic nails 1-5 both feet that are painful with history of trying antifungal therapy     Plan:  H&P reviewed the fact he is already tried antifungal therapy and I do not recommend continuing to take this as he did not show improvement when he took it the 1st time.  Today I debrided all nails 1-5 bilateral carefully taking the corners out with no iatrogenic bleeding and recommended that he have this done on a routine basis as a better solution to his long-term problems

## 2020-02-21 DIAGNOSIS — J45909 Unspecified asthma, uncomplicated: Secondary | ICD-10-CM | POA: Diagnosis not present

## 2020-02-21 DIAGNOSIS — E039 Hypothyroidism, unspecified: Secondary | ICD-10-CM | POA: Diagnosis not present

## 2020-02-21 DIAGNOSIS — E785 Hyperlipidemia, unspecified: Secondary | ICD-10-CM | POA: Diagnosis not present

## 2020-02-21 DIAGNOSIS — G459 Transient cerebral ischemic attack, unspecified: Secondary | ICD-10-CM | POA: Diagnosis not present

## 2020-02-21 DIAGNOSIS — I10 Essential (primary) hypertension: Secondary | ICD-10-CM | POA: Diagnosis not present

## 2020-02-21 DIAGNOSIS — N400 Enlarged prostate without lower urinary tract symptoms: Secondary | ICD-10-CM | POA: Diagnosis not present

## 2020-03-02 DIAGNOSIS — R079 Chest pain, unspecified: Secondary | ICD-10-CM | POA: Diagnosis not present

## 2020-03-02 DIAGNOSIS — N400 Enlarged prostate without lower urinary tract symptoms: Secondary | ICD-10-CM | POA: Diagnosis not present

## 2020-03-02 DIAGNOSIS — R55 Syncope and collapse: Secondary | ICD-10-CM | POA: Diagnosis not present

## 2020-03-02 DIAGNOSIS — I10 Essential (primary) hypertension: Secondary | ICD-10-CM | POA: Diagnosis not present

## 2020-03-04 ENCOUNTER — Telehealth: Payer: Self-pay | Admitting: General Practice

## 2020-03-04 ENCOUNTER — Other Ambulatory Visit (HOSPITAL_COMMUNITY): Payer: Self-pay | Admitting: Internal Medicine

## 2020-03-04 DIAGNOSIS — I10 Essential (primary) hypertension: Secondary | ICD-10-CM

## 2020-03-04 NOTE — Telephone Encounter (Signed)
Dora from [facility] internal medicine is calling stating she faxed over a request for the patient to be schedule to have a treadmill stress test yesterday to the fax number [phone]. She is wanting to know if it has been received and states the patient reached out to her advising her he is wanting to have it done today, but she made him aware that is not possible. Sydell Axon states the patient is going out of town and is wanting to know if it can be scheduled for 03/18/20. Please advise.

## 2020-03-13 ENCOUNTER — Telehealth (HOSPITAL_COMMUNITY): Payer: Self-pay | Admitting: *Deleted

## 2020-03-13 NOTE — Telephone Encounter (Signed)
Close encounter 

## 2020-03-16 ENCOUNTER — Other Ambulatory Visit (HOSPITAL_COMMUNITY)
Admission: RE | Admit: 2020-03-16 | Discharge: 2020-03-16 | Disposition: A | Discharge status: Home / Self Care | Payer: Medicare HMO | Source: Ambulatory Visit | Attending: Cardiology | Admitting: Cardiology

## 2020-03-16 DIAGNOSIS — Z20822 Contact with and (suspected) exposure to covid-19: Secondary | ICD-10-CM | POA: Insufficient documentation

## 2020-03-16 DIAGNOSIS — Z01812 Encounter for preprocedural laboratory examination: Secondary | ICD-10-CM | POA: Diagnosis not present

## 2020-03-16 LAB — SARS CORONAVIRUS 2 (TAT 6-24 HRS): SARS Coronavirus 2: NEGATIVE

## 2020-03-17 ENCOUNTER — Telehealth (HOSPITAL_COMMUNITY): Payer: Self-pay | Admitting: *Deleted

## 2020-03-17 NOTE — Telephone Encounter (Signed)
Close encounter 

## 2020-03-18 ENCOUNTER — Ambulatory Visit (HOSPITAL_COMMUNITY)
Admission: RE | Admit: 2020-03-18 | Discharge: 2020-03-18 | Disposition: A | Discharge status: Home / Self Care | Payer: Medicare HMO | Source: Ambulatory Visit | Attending: Cardiology | Admitting: Cardiology

## 2020-03-18 ENCOUNTER — Other Ambulatory Visit: Payer: Self-pay

## 2020-03-18 DIAGNOSIS — I10 Essential (primary) hypertension: Secondary | ICD-10-CM

## 2020-03-18 LAB — EXERCISE TOLERANCE TEST
Estimated workload: 4.6 METS
Exercise duration (min): 3 min
Exercise duration (sec): 0 s
MPHR: 133 {beats}/min
Peak HR: 113 {beats}/min
Percent HR: 84 %
Rest HR: 67 {beats}/min

## 2020-03-18 NOTE — Significant Event (Signed)
Cardiology Doctor of the Day Note:  Patient came in for exercise treadmill stress test today. This showed significant ST depressions and frequent PVCs, occasionally in triplets. He denied any symptoms.  I spoke with him for some time after the visit. He endorses that he has had one syncopal event in church and one pre-syncopal event in the shower. He relates both of these to dehydration. He also endorses one episode of exertional angina while climbing the ramp at the [employer].   He feels that he is in good general health and the vitamins/water he drinks keeps him in good shape. I expressed my concern, especially for the syncope in church without warning, that there may be a cardiac issue underlying this.  He feels well. He has no concerns. I discussed that I am concerned that there may be a cardiac etiology behind his symptoms. I instructed him on red flag warning signs that need immediate medical attention, and I instructed him to do minimal exertion. He again notes that he feels well. I discussed further evaluation and workup, and he was reluctant to pursue this today.   I called Dr. Inda Merlin' office, as Dr. Inda Merlin had ordered the test. He is out of the office, but I spoke to Dr. Lysle Rubens, who was on call today. I reiterated the findings above. I recommended an urgent cardiology appt to discuss further workup. He is in agreement with this.  We will see him urgently for consultation, and I discussed this plan with the patient as well. Instructed when to call 911. Suspect he may need cath and possible monitor given the stress test results.  Gary Dresser, MD, PhD [facility]  [address], [place], [place] [address] [phone]

## 2020-03-19 ENCOUNTER — Telehealth: Payer: Self-pay | Admitting: Cardiology

## 2020-03-19 NOTE — Telephone Encounter (Signed)
Spoke with patient regarding appointment with Dr. Harrell Gave scheduled Tuesday 03/24/20 at 3:40pm--- arrival time is 3:20 pm----patient voiced his understanding

## 2020-03-24 ENCOUNTER — Encounter: Payer: Self-pay | Admitting: Cardiology

## 2020-03-24 ENCOUNTER — Other Ambulatory Visit: Payer: Self-pay

## 2020-03-24 ENCOUNTER — Ambulatory Visit (INDEPENDENT_AMBULATORY_CARE_PROVIDER_SITE_OTHER): Payer: Medicare HMO | Admitting: Cardiology

## 2020-03-24 VITALS — BP 138/80 | HR 59 | Ht 72.0 in | Wt 193.0 lb

## 2020-03-24 DIAGNOSIS — Z01810 Encounter for preprocedural cardiovascular examination: Secondary | ICD-10-CM

## 2020-03-24 DIAGNOSIS — E7800 Pure hypercholesterolemia, unspecified: Secondary | ICD-10-CM | POA: Diagnosis not present

## 2020-03-24 DIAGNOSIS — R55 Syncope and collapse: Secondary | ICD-10-CM

## 2020-03-24 DIAGNOSIS — I10 Essential (primary) hypertension: Secondary | ICD-10-CM

## 2020-03-24 DIAGNOSIS — R079 Chest pain, unspecified: Secondary | ICD-10-CM

## 2020-03-24 DIAGNOSIS — Z7189 Other specified counseling: Secondary | ICD-10-CM | POA: Diagnosis not present

## 2020-03-24 DIAGNOSIS — R9439 Abnormal result of other cardiovascular function study: Secondary | ICD-10-CM | POA: Diagnosis not present

## 2020-03-24 DIAGNOSIS — Z01812 Encounter for preprocedural laboratory examination: Secondary | ICD-10-CM

## 2020-03-24 NOTE — H&P (View-Only) (Signed)
Cardiology Office Note:    Date:  03/24/2020   ID:  Gary Riggs, DOB 26-Aug-1930, MRN [id number]  PCP:  Josetta Huddle, MD  Cardiologist:  Buford Dresser, MD  Referring MD: Josetta Huddle, MD   CC: urgent new patient evaluation for abnormal stress test  History of Present Illness:    Gary Riggs is a 86 y.o. male with a hx of hypertension, hypothyroidism s/p radioactive iodine for Graves disease, BPH who is seen as a new consult at the request of Josetta Huddle, MD for the evaluation and management of abnormal stress test.  Note reviewed from Dr. Inda Merlin from 03/02/20. Noted syncope the day prior in church. Also noted chest tightness the week prior at the [facility] football game. Referred for stress test.  I spoke to Gary Riggs after his stress test 10/13. Stress test was significantly abnormal. He wanted to discuss with Dr. Inda Merlin before committing to next steps for evaluation in cardiology. He has had no symptoms or issues since we spoke.  Has been generally healthy, taking a lot of supplements to stay healthy. For his heart, takes CoQ10 and resveratrol. Has only ever been in the hospital as child for having his tonsils out.   Denies chest pain, shortness of breath at rest or with normal exertion. No PND, orthopnea, LE edema or unexpected weight gain. Has not had syncope since his event at church.   History from my discussion with his from 03/18/20: "He endorses that he has had one syncopal event in church and one pre-syncopal event in the shower. He relates both of these to dehydration. He also endorses one episode of exertional angina while climbing the ramp at the [facility] stadium.   He feels that he is in good general health and the vitamins/water he drinks keeps him in good shape. I expressed my concern, especially for the syncope in church without warning, that there may be a cardiac issue underlying this."  We discussed results of his stress test and recommendations for further  evaluation, see below. Son-in-law is a Tax adviser. Daughter was an Therapist, sports. He would like to share his results and medical records with them.  Past Medical History:  Diagnosis Date  . Diverticulosis   . DJD (degenerative joint disease)   . ED (erectile dysfunction)   . Hemorrhoids   . Hiatal hernia   . Hypertension   . Onychomycosis   . Rosacea   . Thyroid disease     History reviewed. No pertinent surgical history.  Current Medications: Current Outpatient Medications on File Prior to Visit  Medication Sig  . Ascorbic Acid (VITAMIN C) 1000 MG tablet Take 1,000 mg by mouth daily.  Marland Kitchen aspirin 81 MG chewable tablet Chew by mouth daily.  . Cholecalciferol (VITAMIN D-3) 1000 UNITS CAPS Take by mouth.  . levothyroxine (SYNTHROID) 150 MCG tablet   . losartan-hydrochlorothiazide (HYZAAR) 100-12.5 MG per tablet Take 1 tablet by mouth daily.  . Multiple Vitamin (MULTIVITAMIN) capsule Take 1 capsule by mouth daily.  . simvastatin (ZOCOR) 40 MG tablet Take 40 mg by mouth daily.  Marland Kitchen terbinafine (LAMISIL) 250 MG tablet Take 1 tablet (250 mg total) by mouth daily.  . vitamin B-12 (CYANOCOBALAMIN) 100 MCG tablet Take 100 mcg by mouth daily.   No current facility-administered medications on file prior to visit.     Allergies:   Aleve [naproxen sodium], Synthroid [levothyroxine sodium], and Tussionex pennkinetic er [hydrocod polst-cpm polst er]   Social History   Tobacco Use  . Smoking status:  Never Smoker  . Smokeless tobacco: Never Used  Substance Use Topics  . Alcohol use: No  . Drug use: No    Family History: Father had Alzheimer's, mother deceased of unknown causes. Sister with diabetes.  ROS:   Please see the history of present illness.  Additional pertinent ROS: Constitutional: Negative for chills, fever, night sweats, unintentional weight loss  HENT: Negative for ear pain and hearing loss.   Eyes: Negative for loss of vision and eye pain.  Respiratory: Negative for cough, sputum,  wheezing.   Cardiovascular: See HPI. Gastrointestinal: Negative for abdominal pain, melena, and hematochezia.  Genitourinary: Negative for dysuria and hematuria.  Musculoskeletal: Negative for falls and myalgias.  Skin: Negative for itching and rash.  Neurological: Negative for focal weakness, focal sensory changes Endo/Heme/Allergies: Does not bruise/bleed easily.     EKGs/Labs/Other Studies Reviewed:    The following studies were reviewed today: ETT 04/02/2020  Poor exercise capacity, achieved 4.6 METS  Horizontal ST segment depression was noted during stress in the III, aVF, II, V4, V5 and V6 leads.  EKG changes concerning for ischemia. Recommend referral to cardiology for further evaluation  I personally reviewed the strips as DOD that day; they are not all saved in the system. Had significant ventricular ectopy, including PVC triplet.  EKG:  EKG is personally reviewed.  The ekg ordered today demonstrates sinus bradycardia at 59 bpm with LPFB and inferior ST-T wave abnormalities  Recent Labs: No results found for requested labs within last 8760 hours.  Recent Lipid Panel No results found for: CHOL, TRIG, HDL, CHOLHDL, VLDL, LDLCALC, LDLDIRECT  Physical Exam:    VS:  BP 138/80   Pulse (!) 59   Ht 6' (1.829 m)   Wt 193 lb (87.5 kg)   SpO2 98%   BMI 26.18 kg/m     Wt Readings from Last 3 Encounters:  03/24/20 193 lb (87.5 kg)  01/19/15 195 lb 6.4 oz (88.6 kg)    GEN: Well nourished, well developed in no acute distress HEENT: Normal, moist mucous membranes NECK: No JVD CARDIAC: regular rhythm, normal S1 and S2, no rubs or gallops. 1/6 systolic ejection murmur VASCULAR: Radial and DP pulses 2+ bilaterally. No carotid bruits RESPIRATORY:  Clear to auscultation without rales, wheezing or rhonchi  ABDOMEN: Soft, non-tender, non-distended MUSCULOSKELETAL:  Ambulates independently SKIN: Warm and dry, no edema NEUROLOGIC:  Alert and oriented x 3. No focal neuro deficits  noted. PSYCHIATRIC:  Normal affect    ASSESSMENT:    1. Abnormal stress test   2. Syncope and collapse   3. Pre-procedure lab exam   4. Exertional chest pain   5. Hypertension, unspecified type   6. Cardiac risk counseling   7. Counseling on health promotion and disease prevention    PLAN:    Syncope  Exertional chest pain Abnormal stress test -We reviewed his test and symptoms at length today. We discussed options for further evaluation extensively. After shared decision making, he will proceed with cath -Risks and benefits of cardiac catheterization have been discussed with the patient.  These include bleeding, infection, kidney damage, stroke, heart attack, death.  The patient understands these risks and is willing to proceed. -labs per [facility] reviewed:  Hgb 13.3, Cr 1.04, K 4.6 on 03/02/20 -will repeat BMET, CBC today prior to cath -I am very concerned about his symptoms and again expressed this today. He has multiple risk factors, symptoms, and a high risk stress test. Counseled on red flag warning signs that need  immediate medical attention -would get echo at follow up  Hypertension: near goal today -continue losartan-HCTZ in general, but hold for day of cath  Hypercholesterolemia: -labs per [facility] 12/31/19: Tchol 268, HDL 46, LDL 95, TG 147 -unclear if these were on simvastatin -LDL highly concerning for LDL mutation, though he is 73 and to date has not had prior CV issues until recently  Cardiac risk counseling and prevention recommendations: -recommend heart healthy/Mediterranean diet, with whole grains, fruits, vegetable, fish, lean meats, nuts, and olive oil. Limit salt. -recommend moderate walking, 3-5 times/week for 30-50 minutes each session. Aim for at least 150 minutes.week. Goal should be pace of 3 miles/hours, or walking 1.5 miles in 30 minutes -recommend avoidance of tobacco products. Avoid excess alcohol.  Plan for follow up: 2 weeks, post cath  High complexity  visit, with high risk cardiac features that needed extended explanation and counseling. Needs urgent cath, scheduled for within a week.  Buford Dresser, MD, PhD [place]  [facility]    Medication Adjustments/Labs and Tests Ordered: Current medicines are reviewed at length with the patient today.  Concerns regarding medicines are outlined above.  Orders Placed This Encounter  Procedures  . CBC  . Basic metabolic panel  . EKG 12-Lead   No orders of the defined types were placed in this encounter.   Patient Instructions  Medication Instructions:  Your Physician recommend you continue on your current medication as directed.    *If you need a refill on your cardiac medications before your next appointment, please call your pharmacy*   Lab Work: Your physician recommends that you return for lab work on Thursday 10/21 ( CBC, BMP).  If you have labs (blood work) drawn today and your tests are completely normal, you will receive your results only by: Marland Kitchen MyChart Message (if you have MyChart) OR . A paper copy in the mail If you have any lab test that is abnormal or we need to change your treatment, we will call you to review the results.   Testing/Procedures: Your physician has requested that you have a cardiac catheterization. Cardiac catheterization is used to diagnose and/or treat various heart conditions. Doctors may recommend this procedure for a number of different reasons. The most common reason is to evaluate chest pain. Chest pain can be a symptom of coronary artery disease (CAD), and cardiac catheterization can show whether plaque is narrowing or blocking your heart's arteries. This procedure is also used to evaluate the valves, as well as measure the blood flow and oxygen levels in different parts of your heart. For further information please visit [email or web address]. Please follow instruction sheet, as given. [place] will need to have the  coronavirus test completed prior to your procedure. An appointment has been made at 10:20 am on Thursday 10/21//21. This is a Drive Up Visit at [address], [place], [place] [address]. Please tell them that you are there for procedure testing. Stay in your car and someone will be with you shortly. Please make sure to have all other labs completed before this test because you will need to stay quarantined until your procedure.  Follow-Up: At [facility], you and your health needs are our priority.  As part of our continuing mission to provide you with exceptional heart care, we have created designated Provider Care Teams.  These Care Teams include your primary Cardiologist (physician) and Advanced Practice Providers (APPs -  Physician Assistants and Nurse Practitioners) who all work together to provide  you with the care you need, when you need it.  We recommend signing up for the patient portal called "MyChart".  Sign up information is provided on this After Visit Summary.  MyChart is used to connect with patients for Virtual Visits (Telemedicine).  Patients are able to view lab/test results, encounter notes, upcoming appointments, etc.  Non-urgent messages can be sent to your provider as well.   To learn more about what you can do with MyChart, go to [email or web address].    Your next appointment:   2 week(s)  The format for your next appointment:   In Person  Provider:   Buford Dresser, MD     [address] [place] [address] Dept: [phone] Loc: [place]  03/24/2020  You are scheduled for a Cardiac Catheterization on Monday, October 25 with Dr. Harrell Gave End.  1. Please arrive at the [facility] (Main Entrance A) at [facility]: [place], [place] [address] at 5:30 AM (This time is two hours before your procedure to  ensure your preparation). Free valet parking service is available.   Special note: Every effort is made to have your procedure done on time. Please understand that emergencies sometimes delay scheduled procedures.  2. Diet: Do not eat solid foods after midnight.  The patient may have clear liquids until 5am upon the day of the procedure.  3. Labs: You will need to have blood drawn on Thursday, October 21 at [place]  Open: [place] (Lunch 12:30 - 1:30)   Phone: [phone]. You do not need to be fasting.  4. Medication instructions in preparation for your procedure:   Contrast Allergy: No  Hold Losartan-Hydrochlorothiazide morning of procedure  On the morning of your procedure, take your Aspirin and any morning medicines NOT listed above.  You may use sips of water.  5. Plan for one night stay--bring personal belongings. 6. Bring a current list of your medications and current insurance cards. 7. You MUST have a responsible person to drive you home. 8. Someone MUST be with you the first 24 hours after you arrive home or your discharge will be delayed. 9. Please wear clothes that are easy to get on and off and wear slip-on shoes.  Thank you for allowing [place] to care for you!   -- [facility]     Coronary Angiogram With Stent Coronary angiogram with stent placement is a procedure to widen or open a narrow blood vessel of the heart (coronary artery). Arteries may become blocked by cholesterol buildup (plaques) in the lining of the artery wall. When a coronary artery becomes partially blocked, blood flow to that area decreases. This may lead to chest pain or a heart attack (myocardial infarction). A stent is a small piece of metal that looks like mesh or spring. Stent placement may be done as treatment after a heart attack, or to prevent a heart attack if a blocked artery is found by a coronary angiogram. Let your  health care provider know about:  Any allergies you have, including allergies to medicines or contrast dye.  All medicines you are taking, including vitamins, herbs, eye drops, creams, and over-the-counter medicines.  Any problems you or family members have had with anesthetic medicines.  Any blood disorders you have.  Any surgeries you have had.  Any medical conditions you have, including kidney  problems or kidney failure.  Whether you are pregnant or may be pregnant.  Whether you are breastfeeding. What are the risks? Generally, this is a safe procedure. However, serious problems may occur, including:  Damage to nearby structures or organs, such as the heart, blood vessels, or kidneys.  A return of blockage.  Bleeding, infection, or bruising at the insertion site.  A collection of blood under the skin (hematoma) at the insertion site.  A blood clot in another part of the body.  Allergic reaction to medicines or dyes.  Bleeding into the abdomen (retroperitoneal bleeding).  Stroke (rare).  Heart attack (rare). What happens before the procedure? Staying hydrated Follow instructions from your health care provider about hydration, which may include:  Up to 2 hours before the procedure - you may continue to drink clear liquids, such as water, clear fruit juice, black coffee, and plain tea.  Eating and drinking restrictions Follow instructions from your health care provider about eating and drinking, which may include:  8 hours before the procedure - stop eating heavy meals or foods, such as meat, fried foods, or fatty foods.  6 hours before the procedure - stop eating light meals or foods, such as toast or cereal.  2 hours before the procedure - stop drinking clear liquids. Medicines Ask your health care provider about:  Changing or stopping your regular medicines. This is especially important if you are taking diabetes medicines or blood thinners.  Taking  medicines such as aspirin and ibuprofen. These medicines can thin your blood. Do not take these medicines unless your health care provider tells you to take them. ? Generally, aspirin is recommended before a thin tube, called a catheter, is passed through a blood vessel and inserted into the heart (cardiac catheterization).  Taking over-the-counter medicines, vitamins, herbs, and supplements. General instructions  Do not use any products that contain nicotine or tobacco for at least 4 weeks before the procedure. These products include cigarettes, e-cigarettes, and chewing tobacco. If you need help quitting, ask your health care provider.  Plan to have someone take you home from the hospital or clinic.  If you will be going home right after the procedure, plan to have someone with you for 24 hours.  You may have tests and imaging procedures.  Ask your health care provider: ? How your insertion site will be marked. Ask which artery will be used for the procedure. ? What steps will be taken to help prevent infection. These may include:  Removing hair at the insertion site.  Washing skin with a germ-killing soap.  Taking antibiotic medicine. What happens during the procedure?   An IV will be inserted into one of your veins.  Electrodes may be placed on your chest to monitor your heart rate during the procedure.  You will be given one or more of the following: ? A medicine to help you relax (sedative). ? A medicine to numb the area (local anesthetic) for catheter insertion.  A small incision will be made for catheter insertion.  The catheter will be inserted into an artery using a guide wire. The location may be in your groin, your wrist, or the fold of your arm (near your elbow).  An X-ray procedure (fluoroscopy) will be used to help guide the catheter to the opening of the heart arteries.  A dye will be injected into the catheter. X-rays will be taken. The dye helps to show where  any narrowing or blockages are located in the arteries.  Tell your health care provider if you have chest pain or trouble breathing.  A tiny wire will be guided to the blocked spot, and a balloon will be inflated to make the artery wider.  The stent will be expanded to crush the plaques into the wall of the vessel. The stent will hold the area open and improve the blood flow. Most stents have a drug coating to reduce the risk of the stent narrowing over time.  The artery may be made wider using a drill, laser, or other tools that remove plaques.  The catheter will be removed when the blood flow improves. The stent will stay where it was placed, and the lining of the artery will grow over it.  A bandage (dressing) will be placed on the insertion site. Pressure will be applied to stop bleeding.  The IV will be removed. This procedure may vary among health care providers and hospitals. What happens after the procedure?  Your blood pressure, heart rate, breathing rate, and blood oxygen level will be monitored until you leave the hospital or clinic.  If the procedure is done through the leg, you will lie flat in bed for a few hours or for as long as told by your health care provider. You will be instructed not to bend or cross your legs.  The insertion site and the pulse in your foot or wrist will be checked often.  You may have more blood tests, X-rays, and a test that records the electrical activity of your heart (electrocardiogram, or ECG).  Do not drive for 24 hours if you were given a sedative during your procedure. Summary  Coronary angiogram with stent placement is a procedure to widen or open a narrowed coronary artery. This is done to treat heart problems.  Before the procedure, let your health care provider know about all the medical conditions and surgeries you have or have had.  This is a safe procedure. However, some problems may occur, including damage to nearby structures  or organs, bleeding, blood clots, or allergies.  Follow your health care provider's instructions about eating, drinking, medicines, and other lifestyle changes, such as quitting tobacco use before the procedure. This information is not intended to replace advice given to you by your health care provider. Make sure you discuss any questions you have with your health care provider. Document Revised: 12/12/2018 Document Reviewed: 12/12/2018 Elsevier Patient Education  [place].  Coronary Angiogram A coronary angiogram is an X-ray procedure that is used to examine the arteries in the heart. Contrast dye is injected through a long, thin tube (catheter) into these arteries. Then X-rays are taken to show any blockage in these arteries. You may have this procedure if you:  Are having chest pain, or other symptoms of angina, and you are at risk for heart disease.  Have an abnormal stress test or test of your heart's electrical activity (electrocardiogram, or ECG).  Have chest pain and heart failure.  Are having irregular heart rhythms. A coronary angiogram or heart catheterization can show if you have valve disease or a disease of the aorta. This procedure can also be used to check the overall function of your heart muscle. Let your health care provider know about:  Any allergies you have, including allergies to medicines or contrast dye.  All medicines you are taking, including vitamins, herbs, eye drops, creams, and over-the-counter medicines.  Any problems you or family members have had with anesthetic medicines.  Any blood disorders you  have.  Any surgeries you have had.  Any history of kidney problems or kidney failure.  Any medical conditions you have.  Whether you are pregnant or may be pregnant.  Whether you are breastfeeding. What are the risks? Generally, this is a safe procedure. However, problems may occur, including:  Infection.  Allergic reaction to medicines  or dyes that are used.  Bleeding from the insertion site or other places.  Damage to nearby structures, such as blood vessels, or damage to kidneys from contrast dye.  Irregular heart rhythms.  Stroke (rare).  Heart attack (rare). What happens before the procedure? Staying hydrated Follow instructions from your health care provider about hydration, which may include:  Up to 2 hours before the procedure - you may continue to drink clear liquids, such as water, clear fruit juice, black coffee, and plain tea.  Eating and drinking restrictions Follow instructions from your health care provider about eating and drinking, which may include:  8 hours before the procedure - stop eating heavy meals or foods, such as meat, fried foods, or fatty foods.  6 hours before the procedure - stop eating light meals or foods, such as toast or cereal.  6 hours before the procedure - stop drinking milk or drinks that contain milk.  2 hours before the procedure - stop drinking clear liquids. Medicines Ask your health care provider about:  Changing or stopping your regular medicines. This is especially important if you are taking diabetes medicines or blood thinners.  Taking medicines such as aspirin and ibuprofen. These medicines can thin your blood. Do not take these medicines unless your health care provider tells you to take them. Aspirin may be recommended before coronary angiograms even if you do not normally take it.  Taking over-the-counter medicines, vitamins, herbs, and supplements. General instructions  Do not use any products that contain nicotine or tobacco for at least 4 weeks before the procedure. These products include cigarettes, e-cigarettes, and chewing tobacco. If you need help quitting, ask your health care provider.  You may have an exam or testing.  Plan to have someone take you home from the hospital or clinic.  If you will be going home right after the procedure, plan to  have someone with you for 24 hours.  Ask your health care provider: ? How your insertion site will be marked. ? What steps will be taken to help prevent infection. These may include:  Removing hair at the insertion site.  Washing skin with a germ-killing soap.  Taking antibiotic medicine. What happens during the procedure?   You will lie on your back on an X-ray table.  An IV will be inserted into one of your veins.  Electrodes will be placed on your chest.  You will be given one or more of the following: ? A medicine to help you relax (sedative). ? A medicine to numb the catheter insertion area (local anesthetic).  You will be connected to a continuous ECG monitor.  The catheter will be inserted into an artery in one of these areas: ? Your groin area in your upper thigh. ? Your wrist. ? The fold of your arm, near your elbow.  An X-ray procedure (fluoroscopy) will be used to help guide the catheter to the opening of the blood vessel to be used.  A dye will be injected into the catheter and X-rays will be taken. The dye will help to show any narrowing or blockages in the heart arteries.  Tell your health  care provider if you have chest pain or trouble breathing.  If blockages are found, another procedure may be done to open the artery.  The catheter will be removed after the fluoroscopy is complete.  A bandage (dressing) will be placed over the insertion site. Pressure will be applied to stop bleeding.  The IV will be removed. The procedure may vary among health care providers and hospitals. What happens after the procedure?  Your blood pressure, heart rate, breathing rate, and blood oxygen level will be monitored until you leave the hospital or clinic.  You will need to lie still for a few hours, or for as long as told by your health care provider. ? If the procedure is done through the groin, you will be told not to bend or cross your legs.  The insertion site and  the pulse in your foot or wrist will be checked often.  More blood tests, X-rays, and an ECG may be done.  Do not drive for 24 hours if you were given a sedative during your procedure. Summary  A coronary angiogram is an X-ray procedure that is used to examine the arteries in the heart.  Contrast dye is injected through a long, thin tube (catheter) into each artery.  Tell your health care provider about any allergies you have, including allergies to contrast dye.  After the procedure, you will need to lie still for a few hours and drink plenty of fluids. This information is not intended to replace advice given to you by your health care provider. Make sure you discuss any questions you have with your health care provider. Document Revised: 12/13/2018 Document Reviewed: 12/13/2018 Elsevier Patient Education  2020 [employer].    Signed, Buford Dresser, MD PhD 03/24/2020 6:08 PM    [place]

## 2020-03-24 NOTE — Patient Instructions (Addendum)
Medication Instructions:  Your Physician recommend you continue on your current medication as directed.    *If you need a refill on your cardiac medications before your next appointment, please call your pharmacy*   Lab Work: Your physician recommends that you return for lab work on Thursday 10/21 ( CBC, BMP).  If you have labs (blood work) drawn today and your tests are completely normal, you will receive your results only by: Marland Kitchen MyChart Message (if you have MyChart) OR . A paper copy in the mail If you have any lab test that is abnormal or we need to change your treatment, we will call you to review the results.   Testing/Procedures: Your physician has requested that you have a cardiac catheterization. Cardiac catheterization is used to diagnose and/or treat various heart conditions. Doctors may recommend this procedure for a number of different reasons. The most common reason is to evaluate chest pain. Chest pain can be a symptom of coronary artery disease (CAD), and cardiac catheterization can show whether plaque is narrowing or blocking your heart's arteries. This procedure is also used to evaluate the valves, as well as measure the blood flow and oxygen levels in different parts of your heart. For further information please visit [email or web address]. Please follow instruction sheet, as given. [place] will need to have the coronavirus test completed prior to your procedure. An appointment has been made at 10:20 am on Thursday 10/21//21. This is a Drive Up Visit at [address], [place], [place] [address]. Please tell them that you are there for procedure testing. Stay in your car and someone will be with you shortly. Please make sure to have all other labs completed before this test because you will need to stay quarantined until your procedure.  Follow-Up: At [facility], you and your health needs are our priority.  As part of our continuing mission to provide you  with exceptional heart care, we have created designated Provider Care Teams.  These Care Teams include your primary Cardiologist (physician) and Advanced Practice Providers (APPs -  Physician Assistants and Nurse Practitioners) who all work together to provide you with the care you need, when you need it.  We recommend signing up for the patient portal called "MyChart".  Sign up information is provided on this After Visit Summary.  MyChart is used to connect with patients for Virtual Visits (Telemedicine).  Patients are able to view lab/test results, encounter notes, upcoming appointments, etc.  Non-urgent messages can be sent to your provider as well.   To learn more about what you can do with MyChart, go to [email or web address].    Your next appointment:   2 week(s)  The format for your next appointment:   In Person  Provider:   Buford Dresser, MD     [address] [place] [address] Dept: [phone] Loc: [place]  03/24/2020  You are scheduled for a Cardiac Catheterization on Monday, October 25 with Dr. Harrell Gave End.  1. Please arrive at the [facility] (Main Entrance A) at [facility]: [place], [place] [address] at 5:30 AM (This time is two hours before your procedure to ensure your preparation). Free valet parking service is available.   Special note: Every effort is made to have your procedure done on time. Please understand that emergencies sometimes delay scheduled procedures.  2. Diet: Do not eat solid foods  after midnight.  The patient may have clear liquids until 5am upon the day of the procedure.  3. Labs: You will need to have blood drawn on Thursday, October 21 at [facility]  Open: [place] (Lunch 12:30 - 1:30)   Phone: [phone]. You do not need to be fasting.  4.  Medication instructions in preparation for your procedure:   Contrast Allergy: No  Hold Losartan-Hydrochlorothiazide morning of procedure  On the morning of your procedure, take your Aspirin and any morning medicines NOT listed above.  You may use sips of water.  5. Plan for one night stay--bring personal belongings. 6. Bring a current list of your medications and current insurance cards. 7. You MUST have a responsible person to drive you home. 8. Someone MUST be with you the first 24 hours after you arrive home or your discharge will be delayed. 9. Please wear clothes that are easy to get on and off and wear slip-on shoes.  Thank you for allowing [place] to care for you!   -- [facility]     Coronary Angiogram With Stent Coronary angiogram with stent placement is a procedure to widen or open a narrow blood vessel of the heart (coronary artery). Arteries may become blocked by cholesterol buildup (plaques) in the lining of the artery wall. When a coronary artery becomes partially blocked, blood flow to that area decreases. This may lead to chest pain or a heart attack (myocardial infarction). A stent is a small piece of metal that looks like mesh or spring. Stent placement may be done as treatment after a heart attack, or to prevent a heart attack if a blocked artery is found by a coronary angiogram. Let your health care provider know about:  Any allergies you have, including allergies to medicines or contrast dye.  All medicines you are taking, including vitamins, herbs, eye drops, creams, and over-the-counter medicines.  Any problems you or family members have had with anesthetic medicines.  Any blood disorders you have.  Any surgeries you have had.  Any medical conditions you have, including kidney problems or kidney failure.  Whether you are pregnant or may be pregnant.  Whether you are breastfeeding. What are the risks? Generally, this is a  safe procedure. However, serious problems may occur, including:  Damage to nearby structures or organs, such as the heart, blood vessels, or kidneys.  A return of blockage.  Bleeding, infection, or bruising at the insertion site.  A collection of blood under the skin (hematoma) at the insertion site.  A blood clot in another part of the body.  Allergic reaction to medicines or dyes.  Bleeding into the abdomen (retroperitoneal bleeding).  Stroke (rare).  Heart attack (rare). What happens before the procedure? Staying hydrated Follow instructions from your health care provider about hydration, which may include:  Up to 2 hours before the procedure - you may continue to drink clear liquids, such as water, clear fruit juice, black coffee, and plain tea.  Eating and drinking restrictions Follow instructions from your health care provider about eating and drinking, which may include:  8 hours before the procedure - stop eating heavy meals or foods, such as meat, fried foods, or fatty foods.  6 hours before the procedure - stop eating light meals or foods, such as toast or cereal.  2 hours before the procedure - stop drinking clear liquids. Medicines Ask your health care provider about:  Changing or stopping  your regular medicines. This is especially important if you are taking diabetes medicines or blood thinners.  Taking medicines such as aspirin and ibuprofen. These medicines can thin your blood. Do not take these medicines unless your health care provider tells you to take them. ? Generally, aspirin is recommended before a thin tube, called a catheter, is passed through a blood vessel and inserted into the heart (cardiac catheterization).  Taking over-the-counter medicines, vitamins, herbs, and supplements. General instructions  Do not use any products that contain nicotine or tobacco for at least 4 weeks before the procedure. These products include cigarettes, e-cigarettes,  and chewing tobacco. If you need help quitting, ask your health care provider.  Plan to have someone take you home from the hospital or clinic.  If you will be going home right after the procedure, plan to have someone with you for 24 hours.  You may have tests and imaging procedures.  Ask your health care provider: ? How your insertion site will be marked. Ask which artery will be used for the procedure. ? What steps will be taken to help prevent infection. These may include:  Removing hair at the insertion site.  Washing skin with a germ-killing soap.  Taking antibiotic medicine. What happens during the procedure?   An IV will be inserted into one of your veins.  Electrodes may be placed on your chest to monitor your heart rate during the procedure.  You will be given one or more of the following: ? A medicine to help you relax (sedative). ? A medicine to numb the area (local anesthetic) for catheter insertion.  A small incision will be made for catheter insertion.  The catheter will be inserted into an artery using a guide wire. The location may be in your groin, your wrist, or the fold of your arm (near your elbow).  An X-ray procedure (fluoroscopy) will be used to help guide the catheter to the opening of the heart arteries.  A dye will be injected into the catheter. X-rays will be taken. The dye helps to show where any narrowing or blockages are located in the arteries.  Tell your health care provider if you have chest pain or trouble breathing.  A tiny wire will be guided to the blocked spot, and a balloon will be inflated to make the artery wider.  The stent will be expanded to crush the plaques into the wall of the vessel. The stent will hold the area open and improve the blood flow. Most stents have a drug coating to reduce the risk of the stent narrowing over time.  The artery may be made wider using a drill, laser, or other tools that remove plaques.  The  catheter will be removed when the blood flow improves. The stent will stay where it was placed, and the lining of the artery will grow over it.  A bandage (dressing) will be placed on the insertion site. Pressure will be applied to stop bleeding.  The IV will be removed. This procedure may vary among health care providers and hospitals. What happens after the procedure?  Your blood pressure, heart rate, breathing rate, and blood oxygen level will be monitored until you leave the hospital or clinic.  If the procedure is done through the leg, you will lie flat in bed for a few hours or for as long as told by your health care provider. You will be instructed not to bend or cross your legs.  The insertion site and  the pulse in your foot or wrist will be checked often.  You may have more blood tests, X-rays, and a test that records the electrical activity of your heart (electrocardiogram, or ECG).  Do not drive for 24 hours if you were given a sedative during your procedure. Summary  Coronary angiogram with stent placement is a procedure to widen or open a narrowed coronary artery. This is done to treat heart problems.  Before the procedure, let your health care provider know about all the medical conditions and surgeries you have or have had.  This is a safe procedure. However, some problems may occur, including damage to nearby structures or organs, bleeding, blood clots, or allergies.  Follow your health care provider's instructions about eating, drinking, medicines, and other lifestyle changes, such as quitting tobacco use before the procedure. This information is not intended to replace advice given to you by your health care provider. Make sure you discuss any questions you have with your health care provider. Document Revised: 12/12/2018 Document Reviewed: 12/12/2018 Elsevier Patient Education  [place].  Coronary Angiogram A coronary angiogram is an X-ray procedure that is  used to examine the arteries in the heart. Contrast dye is injected through a long, thin tube (catheter) into these arteries. Then X-rays are taken to show any blockage in these arteries. You may have this procedure if you:  Are having chest pain, or other symptoms of angina, and you are at risk for heart disease.  Have an abnormal stress test or test of your heart's electrical activity (electrocardiogram, or ECG).  Have chest pain and heart failure.  Are having irregular heart rhythms. A coronary angiogram or heart catheterization can show if you have valve disease or a disease of the aorta. This procedure can also be used to check the overall function of your heart muscle. Let your health care provider know about:  Any allergies you have, including allergies to medicines or contrast dye.  All medicines you are taking, including vitamins, herbs, eye drops, creams, and over-the-counter medicines.  Any problems you or family members have had with anesthetic medicines.  Any blood disorders you have.  Any surgeries you have had.  Any history of kidney problems or kidney failure.  Any medical conditions you have.  Whether you are pregnant or may be pregnant.  Whether you are breastfeeding. What are the risks? Generally, this is a safe procedure. However, problems may occur, including:  Infection.  Allergic reaction to medicines or dyes that are used.  Bleeding from the insertion site or other places.  Damage to nearby structures, such as blood vessels, or damage to kidneys from contrast dye.  Irregular heart rhythms.  Stroke (rare).  Heart attack (rare). What happens before the procedure? Staying hydrated Follow instructions from your health care provider about hydration, which may include:  Up to 2 hours before the procedure - you may continue to drink clear liquids, such as water, clear fruit juice, black coffee, and plain tea.  Eating and drinking  restrictions Follow instructions from your health care provider about eating and drinking, which may include:  8 hours before the procedure - stop eating heavy meals or foods, such as meat, fried foods, or fatty foods.  6 hours before the procedure - stop eating light meals or foods, such as toast or cereal.  6 hours before the procedure - stop drinking milk or drinks that contain milk.  2 hours before the procedure - stop drinking clear liquids. Medicines Ask  your health care provider about:  Changing or stopping your regular medicines. This is especially important if you are taking diabetes medicines or blood thinners.  Taking medicines such as aspirin and ibuprofen. These medicines can thin your blood. Do not take these medicines unless your health care provider tells you to take them. Aspirin may be recommended before coronary angiograms even if you do not normally take it.  Taking over-the-counter medicines, vitamins, herbs, and supplements. General instructions  Do not use any products that contain nicotine or tobacco for at least 4 weeks before the procedure. These products include cigarettes, e-cigarettes, and chewing tobacco. If you need help quitting, ask your health care provider.  You may have an exam or testing.  Plan to have someone take you home from the hospital or clinic.  If you will be going home right after the procedure, plan to have someone with you for 24 hours.  Ask your health care provider: ? How your insertion site will be marked. ? What steps will be taken to help prevent infection. These may include:  Removing hair at the insertion site.  Washing skin with a germ-killing soap.  Taking antibiotic medicine. What happens during the procedure?   You will lie on your back on an X-ray table.  An IV will be inserted into one of your veins.  Electrodes will be placed on your chest.  You will be given one or more of the following: ? A medicine to  help you relax (sedative). ? A medicine to numb the catheter insertion area (local anesthetic).  You will be connected to a continuous ECG monitor.  The catheter will be inserted into an artery in one of these areas: ? Your groin area in your upper thigh. ? Your wrist. ? The fold of your arm, near your elbow.  An X-ray procedure (fluoroscopy) will be used to help guide the catheter to the opening of the blood vessel to be used.  A dye will be injected into the catheter and X-rays will be taken. The dye will help to show any narrowing or blockages in the heart arteries.  Tell your health care provider if you have chest pain or trouble breathing.  If blockages are found, another procedure may be done to open the artery.  The catheter will be removed after the fluoroscopy is complete.  A bandage (dressing) will be placed over the insertion site. Pressure will be applied to stop bleeding.  The IV will be removed. The procedure may vary among health care providers and hospitals. What happens after the procedure?  Your blood pressure, heart rate, breathing rate, and blood oxygen level will be monitored until you leave the hospital or clinic.  You will need to lie still for a few hours, or for as long as told by your health care provider. ? If the procedure is done through the groin, you will be told not to bend or cross your legs.  The insertion site and the pulse in your foot or wrist will be checked often.  More blood tests, X-rays, and an ECG may be done.  Do not drive for 24 hours if you were given a sedative during your procedure. Summary  A coronary angiogram is an X-ray procedure that is used to examine the arteries in the heart.  Contrast dye is injected through a long, thin tube (catheter) into each artery.  Tell your health care provider about any allergies you have, including allergies to contrast dye.  After  the procedure, you will need to lie still for a few hours  and drink plenty of fluids. This information is not intended to replace advice given to you by your health care provider. Make sure you discuss any questions you have with your health care provider. Document Revised: 12/13/2018 Document Reviewed: 12/13/2018 Elsevier Patient Education  [place].

## 2020-03-24 NOTE — Progress Notes (Signed)
Cardiology Office Note:    Date:  03/24/2020   ID:  Gary Riggs, DOB 06/03/1931, MRN [id number]  PCP:  Josetta Huddle, MD  Cardiologist:  Buford Dresser, MD  Referring MD: Josetta Huddle, MD   CC: urgent new patient evaluation for abnormal stress test  History of Present Illness:    Gary Riggs is a 86 y.o. male with a hx of hypertension, hypothyroidism s/p radioactive iodine for Graves disease, BPH who is seen as a new consult at the request of Josetta Huddle, MD for the evaluation and management of abnormal stress test.  Note reviewed from Dr. Inda Merlin from 03/02/20. Noted syncope the day prior in church. Also noted chest tightness the week prior at the [facility] football game. Referred for stress test.  I spoke to Gary Riggs after his stress test 10/13. Stress test was significantly abnormal. He wanted to discuss with Dr. Inda Merlin before committing to next steps for evaluation in cardiology. He has had no symptoms or issues since we spoke.  Has been generally healthy, taking a lot of supplements to stay healthy. For his heart, takes CoQ10 and resveratrol. Has only ever been in the hospital as child for having his tonsils out.   Denies chest pain, shortness of breath at rest or with normal exertion. No PND, orthopnea, LE edema or unexpected weight gain. Has not had syncope since his event at church.   History from my discussion with his from 03/18/20: "He endorses that he has had one syncopal event in church and one pre-syncopal event in the shower. He relates both of these to dehydration. He also endorses one episode of exertional angina while climbing the ramp at the [facility] stadium.   He feels that he is in good general health and the vitamins/water he drinks keeps him in good shape. I expressed my concern, especially for the syncope in church without warning, that there may be a cardiac issue underlying this."  We discussed results of his stress test and recommendations for further  evaluation, see below. Son-in-law is a Tax adviser. Daughter was an Therapist, sports. He would like to share his results and medical records with them.  Past Medical History:  Diagnosis Date  . Diverticulosis   . DJD (degenerative joint disease)   . ED (erectile dysfunction)   . Hemorrhoids   . Hiatal hernia   . Hypertension   . Onychomycosis   . Rosacea   . Thyroid disease     History reviewed. No pertinent surgical history.  Current Medications: Current Outpatient Medications on File Prior to Visit  Medication Sig  . Ascorbic Acid (VITAMIN C) 1000 MG tablet Take 1,000 mg by mouth daily.  Marland Kitchen aspirin 81 MG chewable tablet Chew by mouth daily.  . Cholecalciferol (VITAMIN D-3) 1000 UNITS CAPS Take by mouth.  . levothyroxine (SYNTHROID) 150 MCG tablet   . losartan-hydrochlorothiazide (HYZAAR) 100-12.5 MG per tablet Take 1 tablet by mouth daily.  . Multiple Vitamin (MULTIVITAMIN) capsule Take 1 capsule by mouth daily.  . simvastatin (ZOCOR) 40 MG tablet Take 40 mg by mouth daily.  Marland Kitchen terbinafine (LAMISIL) 250 MG tablet Take 1 tablet (250 mg total) by mouth daily.  . vitamin B-12 (CYANOCOBALAMIN) 100 MCG tablet Take 100 mcg by mouth daily.   No current facility-administered medications on file prior to visit.     Allergies:   Aleve [naproxen sodium], Synthroid [levothyroxine sodium], and Tussionex pennkinetic er [hydrocod polst-cpm polst er]   Social History   Tobacco Use  . Smoking status:  Never Smoker  . Smokeless tobacco: Never Used  Substance Use Topics  . Alcohol use: No  . Drug use: No    Family History: Father had Alzheimer's, mother deceased of unknown causes. Sister with diabetes.  ROS:   Please see the history of present illness.  Additional pertinent ROS: Constitutional: Negative for chills, fever, night sweats, unintentional weight loss  HENT: Negative for ear pain and hearing loss.   Eyes: Negative for loss of vision and eye pain.  Respiratory: Negative for cough, sputum,  wheezing.   Cardiovascular: See HPI. Gastrointestinal: Negative for abdominal pain, melena, and hematochezia.  Genitourinary: Negative for dysuria and hematuria.  Musculoskeletal: Negative for falls and myalgias.  Skin: Negative for itching and rash.  Neurological: Negative for focal weakness, focal sensory changes Endo/Heme/Allergies: Does not bruise/bleed easily.     EKGs/Labs/Other Studies Reviewed:    The following studies were reviewed today: ETT 03/21/20  Poor exercise capacity, achieved 4.6 METS  Horizontal ST segment depression was noted during stress in the III, aVF, II, V4, V5 and V6 leads.  EKG changes concerning for ischemia. Recommend referral to cardiology for further evaluation  I personally reviewed the strips as DOD that day; they are not all saved in the system. Had significant ventricular ectopy, including PVC triplet.  EKG:  EKG is personally reviewed.  The ekg ordered today demonstrates sinus bradycardia at 59 bpm with LPFB and inferior ST-T wave abnormalities  Recent Labs: No results found for requested labs within last 8760 hours.  Recent Lipid Panel No results found for: CHOL, TRIG, HDL, CHOLHDL, VLDL, LDLCALC, LDLDIRECT  Physical Exam:    VS:  BP 138/80   Pulse (!) 59   Ht 6' (1.829 m)   Wt 193 lb (87.5 kg)   SpO2 98%   BMI 26.18 kg/m     Wt Readings from Last 3 Encounters:  03/24/20 193 lb (87.5 kg)  01/19/15 195 lb 6.4 oz (88.6 kg)    GEN: Well nourished, well developed in no acute distress HEENT: Normal, moist mucous membranes NECK: No JVD CARDIAC: regular rhythm, normal S1 and S2, no rubs or gallops. 1/6 systolic ejection murmur VASCULAR: Radial and DP pulses 2+ bilaterally. No carotid bruits RESPIRATORY:  Clear to auscultation without rales, wheezing or rhonchi  ABDOMEN: Soft, non-tender, non-distended MUSCULOSKELETAL:  Ambulates independently SKIN: Warm and dry, no edema NEUROLOGIC:  Alert and oriented x 3. No focal neuro deficits  noted. PSYCHIATRIC:  Normal affect    ASSESSMENT:    1. Abnormal stress test   2. Syncope and collapse   3. Pre-procedure lab exam   4. Exertional chest pain   5. Hypertension, unspecified type   6. Cardiac risk counseling   7. Counseling on health promotion and disease prevention    PLAN:    Syncope  Exertional chest pain Abnormal stress test -We reviewed his test and symptoms at length today. We discussed options for further evaluation extensively. After shared decision making, he will proceed with cath -Risks and benefits of cardiac catheterization have been discussed with the patient.  These include bleeding, infection, kidney damage, stroke, heart attack, death.  The patient understands these risks and is willing to proceed. -labs per [facility] reviewed:  Hgb 13.3, Cr 1.04, K 4.6 on 03/02/20 -will repeat BMET, CBC today prior to cath -I am very concerned about his symptoms and again expressed this today. He has multiple risk factors, symptoms, and a high risk stress test. Counseled on red flag warning signs that need  immediate medical attention -would get echo at follow up  Hypertension: near goal today -continue losartan-HCTZ in general, but hold for day of cath  Hypercholesterolemia: -labs per [facility] 12/31/19: Tchol 268, HDL 46, LDL 95, TG 147 -unclear if these were on simvastatin -LDL highly concerning for LDL mutation, though he is 21 and to date has not had prior CV issues until recently  Cardiac risk counseling and prevention recommendations: -recommend heart healthy/Mediterranean diet, with whole grains, fruits, vegetable, fish, lean meats, nuts, and olive oil. Limit salt. -recommend moderate walking, 3-5 times/week for 30-50 minutes each session. Aim for at least 150 minutes.week. Goal should be pace of 3 miles/hours, or walking 1.5 miles in 30 minutes -recommend avoidance of tobacco products. Avoid excess alcohol.  Plan for follow up: 2 weeks, post cath  High complexity  visit, with high risk cardiac features that needed extended explanation and counseling. Needs urgent cath, scheduled for within a week.  Buford Dresser, MD, PhD [place]  [facility]    Medication Adjustments/Labs and Tests Ordered: Current medicines are reviewed at length with the patient today.  Concerns regarding medicines are outlined above.  Orders Placed This Encounter  Procedures  . CBC  . Basic metabolic panel  . EKG 12-Lead   No orders of the defined types were placed in this encounter.   Patient Instructions  Medication Instructions:  Your Physician recommend you continue on your current medication as directed.    *If you need a refill on your cardiac medications before your next appointment, please call your pharmacy*   Lab Work: Your physician recommends that you return for lab work on Thursday 10/21 ( CBC, BMP).  If you have labs (blood work) drawn today and your tests are completely normal, you will receive your results only by: Marland Kitchen MyChart Message (if you have MyChart) OR . A paper copy in the mail If you have any lab test that is abnormal or we need to change your treatment, we will call you to review the results.   Testing/Procedures: Your physician has requested that you have a cardiac catheterization. Cardiac catheterization is used to diagnose and/or treat various heart conditions. Doctors may recommend this procedure for a number of different reasons. The most common reason is to evaluate chest pain. Chest pain can be a symptom of coronary artery disease (CAD), and cardiac catheterization can show whether plaque is narrowing or blocking your heart's arteries. This procedure is also used to evaluate the valves, as well as measure the blood flow and oxygen levels in different parts of your heart. For further information please visit [email or web address]. Please follow instruction sheet, as given. [place] will need to have the  coronavirus test completed prior to your procedure. An appointment has been made at 10:20 am on Thursday 10/21//21. This is a Drive Up Visit at [address], [place], [address] [address]. Please tell them that you are there for procedure testing. Stay in your car and someone will be with you shortly. Please make sure to have all other labs completed before this test because you will need to stay quarantined until your procedure.  Follow-Up: At [facility], you and your health needs are our priority.  As part of our continuing mission to provide you with exceptional heart care, we have created designated Provider Care Teams.  These Care Teams include your primary Cardiologist (physician) and Advanced Practice Providers (APPs -  Physician Assistants and Nurse Practitioners) who all work together to provide  you with the care you need, when you need it.  We recommend signing up for the patient portal called "MyChart".  Sign up information is provided on this After Visit Summary.  MyChart is used to connect with patients for Virtual Visits (Telemedicine).  Patients are able to view lab/test results, encounter notes, upcoming appointments, etc.  Non-urgent messages can be sent to your provider as well.   To learn more about what you can do with MyChart, go to [email or web address].    Your next appointment:   2 week(s)  The format for your next appointment:   In Person  Provider:   Buford Dresser, MD     [place] [address] [address] Dept: [phone] Loc: [place]  03/24/2020  You are scheduled for a Cardiac Catheterization on Monday, October 25 with Dr. Harrell Gave End.  1. Please arrive at the [facility] (Main Entrance A) at [facility]: [place], [place] [address] at 5:30 AM (This time is two hours before your procedure to  ensure your preparation). Free valet parking service is available.   Special note: Every effort is made to have your procedure done on time. Please understand that emergencies sometimes delay scheduled procedures.  2. Diet: Do not eat solid foods after midnight.  The patient may have clear liquids until 5am upon the day of the procedure.  3. Labs: You will need to have blood drawn on Thursday, October 21 at [place]  Open: [place] (Lunch 12:30 - 1:30)   Phone: [phone]. You do not need to be fasting.  4. Medication instructions in preparation for your procedure:   Contrast Allergy: No  Hold Losartan-Hydrochlorothiazide morning of procedure  On the morning of your procedure, take your Aspirin and any morning medicines NOT listed above.  You may use sips of water.  5. Plan for one night stay--bring personal belongings. 6. Bring a current list of your medications and current insurance cards. 7. You MUST have a responsible person to drive you home. 8. Someone MUST be with you the first 24 hours after you arrive home or your discharge will be delayed. 9. Please wear clothes that are easy to get on and off and wear slip-on shoes.  Thank you for allowing [place] to care for you!   -- [place] [facility]     Coronary Angiogram With Stent Coronary angiogram with stent placement is a procedure to widen or open a narrow blood vessel of the heart (coronary artery). Arteries may become blocked by cholesterol buildup (plaques) in the lining of the artery wall. When a coronary artery becomes partially blocked, blood flow to that area decreases. This may lead to chest pain or a heart attack (myocardial infarction). A stent is a small piece of metal that looks like mesh or spring. Stent placement may be done as treatment after a heart attack, or to prevent a heart attack if a blocked artery is found by a coronary angiogram. Let your  health care provider know about:  Any allergies you have, including allergies to medicines or contrast dye.  All medicines you are taking, including vitamins, herbs, eye drops, creams, and over-the-counter medicines.  Any problems you or family members have had with anesthetic medicines.  Any blood disorders you have.  Any surgeries you have had.  Any medical conditions you have, including kidney  problems or kidney failure.  Whether you are pregnant or may be pregnant.  Whether you are breastfeeding. What are the risks? Generally, this is a safe procedure. However, serious problems may occur, including:  Damage to nearby structures or organs, such as the heart, blood vessels, or kidneys.  A return of blockage.  Bleeding, infection, or bruising at the insertion site.  A collection of blood under the skin (hematoma) at the insertion site.  A blood clot in another part of the body.  Allergic reaction to medicines or dyes.  Bleeding into the abdomen (retroperitoneal bleeding).  Stroke (rare).  Heart attack (rare). What happens before the procedure? Staying hydrated Follow instructions from your health care provider about hydration, which may include:  Up to 2 hours before the procedure - you may continue to drink clear liquids, such as water, clear fruit juice, black coffee, and plain tea.  Eating and drinking restrictions Follow instructions from your health care provider about eating and drinking, which may include:  8 hours before the procedure - stop eating heavy meals or foods, such as meat, fried foods, or fatty foods.  6 hours before the procedure - stop eating light meals or foods, such as toast or cereal.  2 hours before the procedure - stop drinking clear liquids. Medicines Ask your health care provider about:  Changing or stopping your regular medicines. This is especially important if you are taking diabetes medicines or blood thinners.  Taking  medicines such as aspirin and ibuprofen. These medicines can thin your blood. Do not take these medicines unless your health care provider tells you to take them. ? Generally, aspirin is recommended before a thin tube, called a catheter, is passed through a blood vessel and inserted into the heart (cardiac catheterization).  Taking over-the-counter medicines, vitamins, herbs, and supplements. General instructions  Do not use any products that contain nicotine or tobacco for at least 4 weeks before the procedure. These products include cigarettes, e-cigarettes, and chewing tobacco. If you need help quitting, ask your health care provider.  Plan to have someone take you home from the hospital or clinic.  If you will be going home right after the procedure, plan to have someone with you for 24 hours.  You may have tests and imaging procedures.  Ask your health care provider: ? How your insertion site will be marked. Ask which artery will be used for the procedure. ? What steps will be taken to help prevent infection. These may include:  Removing hair at the insertion site.  Washing skin with a germ-killing soap.  Taking antibiotic medicine. What happens during the procedure?   An IV will be inserted into one of your veins.  Electrodes may be placed on your chest to monitor your heart rate during the procedure.  You will be given one or more of the following: ? A medicine to help you relax (sedative). ? A medicine to numb the area (local anesthetic) for catheter insertion.  A small incision will be made for catheter insertion.  The catheter will be inserted into an artery using a guide wire. The location may be in your groin, your wrist, or the fold of your arm (near your elbow).  An X-ray procedure (fluoroscopy) will be used to help guide the catheter to the opening of the heart arteries.  A dye will be injected into the catheter. X-rays will be taken. The dye helps to show where  any narrowing or blockages are located in the arteries.  Tell your health care provider if you have chest pain or trouble breathing.  A tiny wire will be guided to the blocked spot, and a balloon will be inflated to make the artery wider.  The stent will be expanded to crush the plaques into the wall of the vessel. The stent will hold the area open and improve the blood flow. Most stents have a drug coating to reduce the risk of the stent narrowing over time.  The artery may be made wider using a drill, laser, or other tools that remove plaques.  The catheter will be removed when the blood flow improves. The stent will stay where it was placed, and the lining of the artery will grow over it.  A bandage (dressing) will be placed on the insertion site. Pressure will be applied to stop bleeding.  The IV will be removed. This procedure may vary among health care providers and hospitals. What happens after the procedure?  Your blood pressure, heart rate, breathing rate, and blood oxygen level will be monitored until you leave the hospital or clinic.  If the procedure is done through the leg, you will lie flat in bed for a few hours or for as long as told by your health care provider. You will be instructed not to bend or cross your legs.  The insertion site and the pulse in your foot or wrist will be checked often.  You may have more blood tests, X-rays, and a test that records the electrical activity of your heart (electrocardiogram, or ECG).  Do not drive for 24 hours if you were given a sedative during your procedure. Summary  Coronary angiogram with stent placement is a procedure to widen or open a narrowed coronary artery. This is done to treat heart problems.  Before the procedure, let your health care provider know about all the medical conditions and surgeries you have or have had.  This is a safe procedure. However, some problems may occur, including damage to nearby structures  or organs, bleeding, blood clots, or allergies.  Follow your health care provider's instructions about eating, drinking, medicines, and other lifestyle changes, such as quitting tobacco use before the procedure. This information is not intended to replace advice given to you by your health care provider. Make sure you discuss any questions you have with your health care provider. Document Revised: 12/12/2018 Document Reviewed: 12/12/2018 Elsevier Patient Education  [place].  Coronary Angiogram A coronary angiogram is an X-ray procedure that is used to examine the arteries in the heart. Contrast dye is injected through a long, thin tube (catheter) into these arteries. Then X-rays are taken to show any blockage in these arteries. You may have this procedure if you:  Are having chest pain, or other symptoms of angina, and you are at risk for heart disease.  Have an abnormal stress test or test of your heart's electrical activity (electrocardiogram, or ECG).  Have chest pain and heart failure.  Are having irregular heart rhythms. A coronary angiogram or heart catheterization can show if you have valve disease or a disease of the aorta. This procedure can also be used to check the overall function of your heart muscle. Let your health care provider know about:  Any allergies you have, including allergies to medicines or contrast dye.  All medicines you are taking, including vitamins, herbs, eye drops, creams, and over-the-counter medicines.  Any problems you or family members have had with anesthetic medicines.  Any blood disorders you  have.  Any surgeries you have had.  Any history of kidney problems or kidney failure.  Any medical conditions you have.  Whether you are pregnant or may be pregnant.  Whether you are breastfeeding. What are the risks? Generally, this is a safe procedure. However, problems may occur, including:  Infection.  Allergic reaction to medicines  or dyes that are used.  Bleeding from the insertion site or other places.  Damage to nearby structures, such as blood vessels, or damage to kidneys from contrast dye.  Irregular heart rhythms.  Stroke (rare).  Heart attack (rare). What happens before the procedure? Staying hydrated Follow instructions from your health care provider about hydration, which may include:  Up to 2 hours before the procedure - you may continue to drink clear liquids, such as water, clear fruit juice, black coffee, and plain tea.  Eating and drinking restrictions Follow instructions from your health care provider about eating and drinking, which may include:  8 hours before the procedure - stop eating heavy meals or foods, such as meat, fried foods, or fatty foods.  6 hours before the procedure - stop eating light meals or foods, such as toast or cereal.  6 hours before the procedure - stop drinking milk or drinks that contain milk.  2 hours before the procedure - stop drinking clear liquids. Medicines Ask your health care provider about:  Changing or stopping your regular medicines. This is especially important if you are taking diabetes medicines or blood thinners.  Taking medicines such as aspirin and ibuprofen. These medicines can thin your blood. Do not take these medicines unless your health care provider tells you to take them. Aspirin may be recommended before coronary angiograms even if you do not normally take it.  Taking over-the-counter medicines, vitamins, herbs, and supplements. General instructions  Do not use any products that contain nicotine or tobacco for at least 4 weeks before the procedure. These products include cigarettes, e-cigarettes, and chewing tobacco. If you need help quitting, ask your health care provider.  You may have an exam or testing.  Plan to have someone take you home from the hospital or clinic.  If you will be going home right after the procedure, plan to  have someone with you for 24 hours.  Ask your health care provider: ? How your insertion site will be marked. ? What steps will be taken to help prevent infection. These may include:  Removing hair at the insertion site.  Washing skin with a germ-killing soap.  Taking antibiotic medicine. What happens during the procedure?   You will lie on your back on an X-ray table.  An IV will be inserted into one of your veins.  Electrodes will be placed on your chest.  You will be given one or more of the following: ? A medicine to help you relax (sedative). ? A medicine to numb the catheter insertion area (local anesthetic).  You will be connected to a continuous ECG monitor.  The catheter will be inserted into an artery in one of these areas: ? Your groin area in your upper thigh. ? Your wrist. ? The fold of your arm, near your elbow.  An X-ray procedure (fluoroscopy) will be used to help guide the catheter to the opening of the blood vessel to be used.  A dye will be injected into the catheter and X-rays will be taken. The dye will help to show any narrowing or blockages in the heart arteries.  Tell your health  care provider if you have chest pain or trouble breathing.  If blockages are found, another procedure may be done to open the artery.  The catheter will be removed after the fluoroscopy is complete.  A bandage (dressing) will be placed over the insertion site. Pressure will be applied to stop bleeding.  The IV will be removed. The procedure may vary among health care providers and hospitals. What happens after the procedure?  Your blood pressure, heart rate, breathing rate, and blood oxygen level will be monitored until you leave the hospital or clinic.  You will need to lie still for a few hours, or for as long as told by your health care provider. ? If the procedure is done through the groin, you will be told not to bend or cross your legs.  The insertion site and  the pulse in your foot or wrist will be checked often.  More blood tests, X-rays, and an ECG may be done.  Do not drive for 24 hours if you were given a sedative during your procedure. Summary  A coronary angiogram is an X-ray procedure that is used to examine the arteries in the heart.  Contrast dye is injected through a long, thin tube (catheter) into each artery.  Tell your health care provider about any allergies you have, including allergies to contrast dye.  After the procedure, you will need to lie still for a few hours and drink plenty of fluids. This information is not intended to replace advice given to you by your health care provider. Make sure you discuss any questions you have with your health care provider. Document Revised: 12/13/2018 Document Reviewed: 12/13/2018 Elsevier Patient Education  2020 [employer].    Signed, Buford Dresser, MD PhD 03/24/2020 6:08 PM    [place]

## 2020-03-25 ENCOUNTER — Telehealth: Payer: Medicare HMO | Admitting: Cardiology

## 2020-03-25 ENCOUNTER — Telehealth: Payer: Self-pay | Admitting: Cardiology

## 2020-03-25 NOTE — Telephone Encounter (Signed)
-----   Message from Meryl Crutch, RN sent at 03/24/2020  5:43 PM EDT ----- Please schedule pt for a 2 week f/u with Dr. Harrell Gave.  Thanks

## 2020-03-25 NOTE — Telephone Encounter (Signed)
LMTCB see note below.

## 2020-03-25 NOTE — Telephone Encounter (Signed)
Patient's daughter calling to get clarification on lab work that needs to be done prior to heart cath.

## 2020-03-25 NOTE — Telephone Encounter (Signed)
Patients daughter calling to clarify when she should bring patient in to have blood work drawn prior to heart cath. Advised patients daughter that patient should return to office tomorrow 10/21 for blood work and an appointment is not required. Also advised patients daughter that patient has a Covid test scheduled for 10/21 at 10:20am. Patients daughter stated to let her know if patient needs to have anything else done prior to procedure. Advised patients daughter I would forward message to Dr. Judeth Cornfield nurse. Verbalized understanding.

## 2020-03-25 NOTE — Telephone Encounter (Signed)
LMTCB. See note below.

## 2020-03-26 ENCOUNTER — Other Ambulatory Visit (HOSPITAL_COMMUNITY)
Admission: RE | Admit: 2020-03-26 | Discharge: 2020-03-26 | Disposition: A | Discharge status: Home / Self Care | Payer: Medicare HMO | Source: Ambulatory Visit | Attending: Internal Medicine | Admitting: Internal Medicine

## 2020-03-26 ENCOUNTER — Telehealth: Payer: Self-pay | Admitting: *Deleted

## 2020-03-26 DIAGNOSIS — R9439 Abnormal result of other cardiovascular function study: Secondary | ICD-10-CM | POA: Diagnosis not present

## 2020-03-26 DIAGNOSIS — Z01812 Encounter for preprocedural laboratory examination: Secondary | ICD-10-CM | POA: Insufficient documentation

## 2020-03-26 DIAGNOSIS — Z20822 Contact with and (suspected) exposure to covid-19: Secondary | ICD-10-CM | POA: Diagnosis not present

## 2020-03-26 LAB — BASIC METABOLIC PANEL
BUN/Creatinine Ratio: 19 (ref 10–24)
BUN: 18 mg/dL (ref 8–27)
CO2: 26 mmol/L (ref 20–29)
Calcium: 9.5 mg/dL (ref 8.6–10.2)
Chloride: 97 mmol/L (ref 96–106)
Creatinine, Ser: 0.97 mg/dL (ref 0.76–1.27)
GFR calc Af Amer: 81 mL/min/{1.73_m2} (ref >59)
GFR calc non Af Amer: 70 mL/min/{1.73_m2} (ref >59)
Glucose: 94 mg/dL (ref 65–99)
Potassium: 4.8 mmol/L (ref 3.5–5.2)
Sodium: 132 mmol/L — ABNORMAL LOW (ref 134–144)

## 2020-03-26 LAB — CBC
Hematocrit: 38.6 % (ref 37.5–51.0)
Hemoglobin: 12.8 g/dL — ABNORMAL LOW (ref 13.0–17.7)
MCH: 31.7 pg (ref 26.6–33.0)
MCHC: 33.2 g/dL (ref 31.5–35.7)
MCV: 96 fL (ref 79–97)
Platelets: 291 10*3/uL (ref 150–450)
RBC: 4.04 x10E6/uL — ABNORMAL LOW (ref 4.14–5.80)
RDW: 11.6 % (ref 11.6–15.4)
WBC: 6.3 10*3/uL (ref 3.4–10.8)

## 2020-03-26 LAB — SARS CORONAVIRUS 2 (TAT 6-24 HRS): SARS Coronavirus 2: NEGATIVE

## 2020-03-26 NOTE — Telephone Encounter (Addendum)
Pt contacted pre-catheterization scheduled at [facility] for: Monday March 30, 2020 7:30 AM Verified arrival time and place: [facility]) at: 5:30 AM   No solid food after midnight prior to cath, clear liquids until 5 AM day of procedure.  Hold: Losartan-HCTZ-AM of procedure  Except hold medications AM meds can be  taken pre-cath with sips of water including: ASA 81 mg   Confirmed patient has responsible adult to drive home post procedure and be with patient first 24 hours after arriving home: yes  You are allowed ONE visitor in the waiting room during the time you are at the hospital for your procedure. Both you and your visitor must wear a mask once you enter the hospital.       COVID-19 Pre-Screening Questions:   In the past 14 days have you had a new cough, new headache, new nasal congestion, fever (100.4 or greater) unexplained body aches, new sore throat, or sudden loss of taste or sense of smell? no  In the past 14 days have you been around anyone with known Covid 19? No  Have you been vaccinated for COVID-19? Yes, see immunization history  Reviewed procedure/mask/visitor instructions, COVID-19 questions with patient.

## 2020-03-30 ENCOUNTER — Inpatient Hospital Stay (HOSPITAL_COMMUNITY)
Admission: RE | Admit: 2020-03-30 | Discharge: 2020-04-01 | DRG: 246 | Disposition: A | Discharge status: Home / Self Care | Payer: Medicare HMO | Source: Ambulatory Visit | Attending: Internal Medicine | Admitting: Internal Medicine

## 2020-03-30 ENCOUNTER — Other Ambulatory Visit: Payer: Self-pay

## 2020-03-30 ENCOUNTER — Encounter (HOSPITAL_COMMUNITY)
Admission: RE | Disposition: A | Discharge status: Home / Self Care | Payer: Self-pay | Source: Ambulatory Visit | Attending: Internal Medicine

## 2020-03-30 ENCOUNTER — Inpatient Hospital Stay (HOSPITAL_COMMUNITY): Payer: Medicare HMO

## 2020-03-30 DIAGNOSIS — I2510 Atherosclerotic heart disease of native coronary artery without angina pectoris: Secondary | ICD-10-CM

## 2020-03-30 DIAGNOSIS — Z820 Family history of epilepsy and other diseases of the nervous system: Secondary | ICD-10-CM

## 2020-03-30 DIAGNOSIS — Z7982 Long term (current) use of aspirin: Secondary | ICD-10-CM | POA: Diagnosis not present

## 2020-03-30 DIAGNOSIS — I2584 Coronary atherosclerosis due to calcified coronary lesion: Secondary | ICD-10-CM | POA: Diagnosis not present

## 2020-03-30 DIAGNOSIS — N529 Male erectile dysfunction, unspecified: Secondary | ICD-10-CM | POA: Diagnosis present

## 2020-03-30 DIAGNOSIS — E860 Dehydration: Secondary | ICD-10-CM | POA: Diagnosis present

## 2020-03-30 DIAGNOSIS — I25118 Atherosclerotic heart disease of native coronary artery with other forms of angina pectoris: Principal | ICD-10-CM | POA: Diagnosis present

## 2020-03-30 DIAGNOSIS — I361 Nonrheumatic tricuspid (valve) insufficiency: Secondary | ICD-10-CM | POA: Diagnosis not present

## 2020-03-30 DIAGNOSIS — Z79899 Other long term (current) drug therapy: Secondary | ICD-10-CM | POA: Diagnosis not present

## 2020-03-30 DIAGNOSIS — E785 Hyperlipidemia, unspecified: Secondary | ICD-10-CM | POA: Diagnosis present

## 2020-03-30 DIAGNOSIS — Z79890 Hormone replacement therapy (postmenopausal): Secondary | ICD-10-CM

## 2020-03-30 DIAGNOSIS — I340 Nonrheumatic mitral (valve) insufficiency: Secondary | ICD-10-CM

## 2020-03-30 DIAGNOSIS — R9439 Abnormal result of other cardiovascular function study: Secondary | ICD-10-CM | POA: Diagnosis present

## 2020-03-30 DIAGNOSIS — R079 Chest pain, unspecified: Principal | ICD-10-CM | POA: Diagnosis present

## 2020-03-30 DIAGNOSIS — Z833 Family history of diabetes mellitus: Secondary | ICD-10-CM

## 2020-03-30 DIAGNOSIS — R55 Syncope and collapse: Secondary | ICD-10-CM | POA: Diagnosis present

## 2020-03-30 DIAGNOSIS — I10 Essential (primary) hypertension: Secondary | ICD-10-CM | POA: Diagnosis present

## 2020-03-30 DIAGNOSIS — Z888 Allergy status to other drugs, medicaments and biological substances status: Secondary | ICD-10-CM | POA: Diagnosis not present

## 2020-03-30 DIAGNOSIS — K649 Unspecified hemorrhoids: Secondary | ICD-10-CM | POA: Diagnosis not present

## 2020-03-30 DIAGNOSIS — L719 Rosacea, unspecified: Secondary | ICD-10-CM | POA: Diagnosis present

## 2020-03-30 DIAGNOSIS — N400 Enlarged prostate without lower urinary tract symptoms: Secondary | ICD-10-CM | POA: Diagnosis present

## 2020-03-30 DIAGNOSIS — Z20822 Contact with and (suspected) exposure to covid-19: Secondary | ICD-10-CM | POA: Diagnosis present

## 2020-03-30 DIAGNOSIS — E890 Postprocedural hypothyroidism: Secondary | ICD-10-CM | POA: Diagnosis present

## 2020-03-30 DIAGNOSIS — I25110 Atherosclerotic heart disease of native coronary artery with unstable angina pectoris: Secondary | ICD-10-CM | POA: Diagnosis not present

## 2020-03-30 DIAGNOSIS — Z955 Presence of coronary angioplasty implant and graft: Secondary | ICD-10-CM

## 2020-03-30 DIAGNOSIS — I25119 Atherosclerotic heart disease of native coronary artery with unspecified angina pectoris: Secondary | ICD-10-CM

## 2020-03-30 HISTORY — PX: LEFT HEART CATH AND CORONARY ANGIOGRAPHY: CATH118249

## 2020-03-30 HISTORY — DX: Atherosclerotic heart disease of native coronary artery without angina pectoris: I25.10

## 2020-03-30 LAB — CBC
HCT: 39.3 % (ref 39.0–52.0)
Hemoglobin: 12.8 g/dL — ABNORMAL LOW (ref 13.0–17.0)
MCH: 32.2 pg (ref 26.0–34.0)
MCHC: 32.6 g/dL (ref 30.0–36.0)
MCV: 99 fL (ref 80.0–100.0)
Platelets: 260 10*3/uL (ref 150–400)
RBC: 3.97 MIL/uL — ABNORMAL LOW (ref 4.22–5.81)
RDW: 12.5 % (ref 11.5–15.5)
WBC: 9.9 10*3/uL (ref 4.0–10.5)
nRBC: 0 % (ref 0.0–0.2)

## 2020-03-30 LAB — CREATININE, SERUM
Creatinine, Ser: 0.98 mg/dL (ref 0.61–1.24)
GFR, Estimated: >60 mL/min (ref >60)

## 2020-03-30 LAB — ECHOCARDIOGRAM COMPLETE
AR max vel: 1.85 cm2
AV Area VTI: 1.78 cm2
AV Area mean vel: 1.75 cm2
AV Mean grad: 8.3 mmHg
AV Peak grad: 14.6 mmHg
Ao pk vel: 1.91 m/s
Area-P 1/2: 2.76 cm2
Height: 72 in
S' Lateral: 3.3 cm
Weight: 3040 oz

## 2020-03-30 SURGERY — LEFT HEART CATH AND CORONARY ANGIOGRAPHY
Anesthesia: LOCAL

## 2020-03-30 MED ORDER — HEPARIN SODIUM (PORCINE) 1000 UNIT/ML IJ SOLN
INTRAMUSCULAR | Status: AC
  Filled 2020-03-30: qty 1

## 2020-03-30 MED ORDER — SODIUM CHLORIDE 0.9% FLUSH: 3.0000 mL | INTRAVENOUS | Status: DC | PRN

## 2020-03-30 MED ORDER — MIDAZOLAM HCL 2 MG/2ML IJ SOLN
INTRAMUSCULAR | Status: DC | PRN
  Administered 2020-03-30: 0.5 mg via INTRAVENOUS

## 2020-03-30 MED ORDER — MIDAZOLAM HCL 2 MG/2ML IJ SOLN
INTRAMUSCULAR | Status: AC
  Filled 2020-03-30: qty 2

## 2020-03-30 MED ORDER — VITAMIN D 25 MCG (1000 UNIT) PO TABS
1000.0000 [IU] | ORAL_TABLET | Freq: Every day | ORAL | Status: DC
  Administered 2020-03-30 – 2020-04-01 (×3): 1000 [IU] via ORAL
  Filled 2020-03-30 (×3): qty 1

## 2020-03-30 MED ORDER — NITROGLYCERIN 0.4 MG SL SUBL: 0.4000 mg | SUBLINGUAL_TABLET | SUBLINGUAL | Status: DC | PRN

## 2020-03-30 MED ORDER — ASPIRIN 81 MG PO CHEW: 81.0000 mg | CHEWABLE_TABLET | Freq: Every day | ORAL | Status: DC

## 2020-03-30 MED ORDER — FENTANYL CITRATE (PF) 100 MCG/2ML IJ SOLN
INTRAMUSCULAR | Status: DC | PRN
  Administered 2020-03-30: 12.5 ug via INTRAVENOUS

## 2020-03-30 MED ORDER — LABETALOL HCL 5 MG/ML IV SOLN: 10.0000 mg | INTRAVENOUS | Status: AC | PRN

## 2020-03-30 MED ORDER — HYDROCHLOROTHIAZIDE 12.5 MG PO CAPS
12.5000 mg | ORAL_CAPSULE | Freq: Every day | ORAL | Status: DC
  Administered 2020-03-30 – 2020-04-01 (×3): 12.5 mg via ORAL
  Filled 2020-03-30 (×3): qty 1

## 2020-03-30 MED ORDER — ASPIRIN 81 MG PO CHEW: 81.0000 mg | CHEWABLE_TABLET | ORAL | Status: DC

## 2020-03-30 MED ORDER — SIMVASTATIN 20 MG PO TABS
40.0000 mg | ORAL_TABLET | Freq: Every day | ORAL | Status: DC
  Administered 2020-03-30 – 2020-04-01 (×3): 40 mg via ORAL
  Filled 2020-03-30 (×3): qty 2

## 2020-03-30 MED ORDER — CLOPIDOGREL BISULFATE 75 MG PO TABS
600.0000 mg | ORAL_TABLET | Freq: Once | ORAL | Status: AC
  Administered 2020-03-30: 600 mg via ORAL
  Filled 2020-03-30: qty 8

## 2020-03-30 MED ORDER — VERAPAMIL HCL 2.5 MG/ML IV SOLN
INTRAVENOUS | Status: AC
  Filled 2020-03-30: qty 2

## 2020-03-30 MED ORDER — SODIUM CHLORIDE 0.9% FLUSH: 3.0000 mL | Freq: Two times a day (BID) | INTRAVENOUS | Status: DC

## 2020-03-30 MED ORDER — LOSARTAN POTASSIUM-HCTZ 100-12.5 MG PO TABS: 1.0000 | ORAL_TABLET | Freq: Every day | ORAL | Status: DC

## 2020-03-30 MED ORDER — MELATONIN 3 MG PO TABS
3.0000 mg | ORAL_TABLET | Freq: Every evening | ORAL | Status: DC | PRN
  Administered 2020-03-30 – 2020-03-31 (×2): 3 mg via ORAL
  Filled 2020-03-30 (×2): qty 1

## 2020-03-30 MED ORDER — SODIUM CHLORIDE 0.9 % IV SOLN: INTRAVENOUS | Status: AC

## 2020-03-30 MED ORDER — LOSARTAN POTASSIUM 50 MG PO TABS
100.0000 mg | ORAL_TABLET | Freq: Every day | ORAL | Status: DC
  Administered 2020-03-30 – 2020-04-01 (×3): 100 mg via ORAL
  Filled 2020-03-30 (×3): qty 2

## 2020-03-30 MED ORDER — HEPARIN (PORCINE) IN NACL 1000-0.9 UT/500ML-% IV SOLN
INTRAVENOUS | Status: DC | PRN
  Administered 2020-03-30 (×2): 500 mL

## 2020-03-30 MED ORDER — CLOPIDOGREL BISULFATE 75 MG PO TABS
75.0000 mg | ORAL_TABLET | ORAL | Status: AC
  Administered 2020-03-31: 75 mg via ORAL
  Filled 2020-03-30: qty 1

## 2020-03-30 MED ORDER — FENTANYL CITRATE (PF) 100 MCG/2ML IJ SOLN
INTRAMUSCULAR | Status: AC
  Filled 2020-03-30: qty 2

## 2020-03-30 MED ORDER — SODIUM CHLORIDE 0.9 % WEIGHT BASED INFUSION
1.0000 mL/kg/h | INTRAVENOUS | Status: DC
  Administered 2020-03-30: 1 mL/kg/h via INTRAVENOUS

## 2020-03-30 MED ORDER — TAMSULOSIN HCL 0.4 MG PO CAPS
0.4000 mg | ORAL_CAPSULE | Freq: Every day | ORAL | Status: DC
  Administered 2020-03-30 – 2020-03-31 (×2): 0.4 mg via ORAL
  Filled 2020-03-30 (×2): qty 1

## 2020-03-30 MED ORDER — SODIUM CHLORIDE 0.9 % IV SOLN: 250.0000 mL | INTRAVENOUS | Status: DC | PRN

## 2020-03-30 MED ORDER — ACETAMINOPHEN 325 MG PO TABS: 650.0000 mg | ORAL_TABLET | ORAL | Status: DC | PRN

## 2020-03-30 MED ORDER — LIDOCAINE HCL (PF) 1 % IJ SOLN
INTRAMUSCULAR | Status: AC
  Filled 2020-03-30: qty 30

## 2020-03-30 MED ORDER — HYDRALAZINE HCL 20 MG/ML IJ SOLN
10.0000 mg | INTRAMUSCULAR | Status: AC | PRN
  Administered 2020-03-30: 10 mg via INTRAVENOUS

## 2020-03-30 MED ORDER — HEPARIN (PORCINE) IN NACL 1000-0.9 UT/500ML-% IV SOLN
INTRAVENOUS | Status: AC
  Filled 2020-03-30: qty 1000

## 2020-03-30 MED ORDER — HYDRALAZINE HCL 20 MG/ML IJ SOLN
INTRAMUSCULAR | Status: AC
  Filled 2020-03-30: qty 1

## 2020-03-30 MED ORDER — ENOXAPARIN SODIUM 40 MG/0.4ML ~~LOC~~ SOLN
40.0000 mg | SUBCUTANEOUS | Status: DC
  Administered 2020-04-01: 40 mg via SUBCUTANEOUS
  Filled 2020-03-30 (×2): qty 0.4

## 2020-03-30 MED ORDER — VERAPAMIL HCL 2.5 MG/ML IV SOLN
INTRAVENOUS | Status: DC | PRN
  Administered 2020-03-30: 10 mL via INTRA_ARTERIAL

## 2020-03-30 MED ORDER — ASPIRIN 81 MG PO CHEW
81.0000 mg | CHEWABLE_TABLET | ORAL | Status: AC
  Administered 2020-03-31: 81 mg via ORAL
  Filled 2020-03-30: qty 1

## 2020-03-30 MED ORDER — LEVOTHYROXINE SODIUM 25 MCG PO TABS
137.0000 ug | ORAL_TABLET | Freq: Every day | ORAL | Status: DC
  Administered 2020-03-31 – 2020-04-01 (×2): 137 ug via ORAL
  Filled 2020-03-30 (×2): qty 1

## 2020-03-30 MED ORDER — SODIUM CHLORIDE 0.9 % WEIGHT BASED INFUSION
3.0000 mL/kg/h | INTRAVENOUS | Status: DC
  Administered 2020-03-30: 3 mL/kg/h via INTRAVENOUS

## 2020-03-30 MED ORDER — IOHEXOL 350 MG/ML SOLN
INTRAVENOUS | Status: DC | PRN
  Administered 2020-03-30: 65 mL

## 2020-03-30 MED ORDER — ONDANSETRON HCL 4 MG/2ML IJ SOLN: 4.0000 mg | Freq: Four times a day (QID) | INTRAMUSCULAR | Status: DC | PRN

## 2020-03-30 MED ORDER — LIDOCAINE HCL (PF) 1 % IJ SOLN
INTRAMUSCULAR | Status: DC | PRN
  Administered 2020-03-30: 2 mL via INTRADERMAL

## 2020-03-30 MED ORDER — SODIUM CHLORIDE 0.9% FLUSH
3.0000 mL | Freq: Two times a day (BID) | INTRAVENOUS | Status: DC
  Administered 2020-03-30 – 2020-04-01 (×3): 3 mL via INTRAVENOUS

## 2020-03-30 MED ORDER — HEPARIN SODIUM (PORCINE) 1000 UNIT/ML IJ SOLN
INTRAMUSCULAR | Status: DC | PRN
  Administered 2020-03-30: 4500 [IU] via INTRAVENOUS

## 2020-03-30 MED ORDER — SODIUM CHLORIDE 0.9% FLUSH
3.0000 mL | Freq: Two times a day (BID) | INTRAVENOUS | Status: DC
  Administered 2020-03-30 – 2020-03-31 (×2): 3 mL via INTRAVENOUS

## 2020-03-30 SURGICAL SUPPLY — 11 items
CATH 5FR JL3.5 JR4 ANG PIG MP (CATHETERS) ×1 IMPLANT
CATH INFINITI 5FR AL1 (CATHETERS) ×1 IMPLANT
DEVICE RAD COMP TR BAND LRG (VASCULAR PRODUCTS) ×1 IMPLANT
GLIDESHEATH SLEND SS 6F .021 (SHEATH) ×1 IMPLANT
GUIDEWIRE INQWIRE 1.5J.035X260 (WIRE) IMPLANT
INQWIRE 1.5J .035X260CM (WIRE) ×2
KIT HEART LEFT (KITS) ×2 IMPLANT
PACK CARDIAC CATHETERIZATION (CUSTOM PROCEDURE TRAY) ×2 IMPLANT
TRANSDUCER W/STOPCOCK (MISCELLANEOUS) ×2 IMPLANT
TUBING CIL FLEX 10 FLL-RA (TUBING) ×2 IMPLANT
WIRE HI TORQ VERSACORE-J 145CM (WIRE) ×1 IMPLANT

## 2020-03-30 NOTE — Progress Notes (Signed)
  Echocardiogram 2D Echocardiogram has been performed.  Gary Riggs 03/30/2020, 5:20 PM

## 2020-03-30 NOTE — Progress Notes (Signed)
Progress Note  Patient Name: Gary Riggs Date of Encounter: 03/30/2020  [facility] Cardiologist: Buford Dresser, MD   Subjective   Patient feels well and is eager to have coronary revascularization.  He spoke with cardiac surgery today and is adamant that he would not like to undergo open heart surgery if at all possible.  He feels well at this time without chest pain or shortness of breath.  Inpatient Medications    Scheduled Meds: . [START ON 03/31/2020] aspirin  81 mg Oral Daily  . cholecalciferol  1,000 Units Oral Daily  . [START ON 03/31/2020] enoxaparin (LOVENOX) injection  40 mg Subcutaneous Q24H  . losartan  100 mg Oral Daily   And  . hydrochlorothiazide  12.5 mg Oral Daily  . [START ON 03/31/2020] levothyroxine  137 mcg Oral QAC breakfast  . simvastatin  40 mg Oral Daily  . sodium chloride flush  3 mL Intravenous Q12H  . sodium chloride flush  3 mL Intravenous Q12H  . tamsulosin  0.4 mg Oral QHS   Continuous Infusions: . sodium chloride     PRN Meds: sodium chloride, acetaminophen, nitroGLYCERIN, ondansetron (ZOFRAN) IV, sodium chloride flush   Vital Signs    Vitals:   03/30/20 1130 03/30/20 1230 03/30/20 1330 03/30/20 1452  BP: (!) 109/40 (!) 157/62 (!) 151/56 (!) 142/73  Pulse: (!) 59 71 66 64  Resp: 17 12 18 17   Temp:    98.3 F (36.8 C)  TempSrc:    Oral  SpO2: 99% 96% 96% 99%  Weight:      Height:        Intake/Output Summary (Last 24 hours) at 03/30/2020 1947 Last data filed at 03/30/2020 1500 Gross per 24 hour  Intake 723.75 ml  Output 800 ml  Net -76.25 ml   Last 3 Weights 03/30/2020 03/24/2020 01/19/2015  Weight (lbs) 190 lb 193 lb 195 lb 6.4 oz  Weight (kg) 86.183 kg 87.544 kg 88.633 kg       Physical Exam   GEN: No acute distress.    Labs    High Sensitivity Troponin:  No results for input(s): TROPONINIHS in the last 720 hours.    Chemistry Recent Labs  Lab 03/26/20 1136 03/30/20 1459  NA 132*  --   K 4.8   --   CL 97  --   CO2 26  --   GLUCOSE 94  --   BUN 18  --   CREATININE 0.97 0.98  CALCIUM 9.5  --   GFRNONAA 70 >60  GFRAA 81  --      Hematology Recent Labs  Lab 03/26/20 1136 03/30/20 1459  WBC 6.3 9.9  RBC 4.04* 3.97*  HGB 12.8* 12.8*  HCT 38.6 39.3  MCV 96 99.0  MCH 31.7 32.2  MCHC 33.2 32.6  RDW 11.6 12.5  PLT 291 260    BNPNo results for input(s): BNP, PROBNP in the last 168 hours.   DDimer No results for input(s): DDIMER in the last 168 hours.   Radiology    CARDIAC CATHETERIZATION  Result Date: 03/30/2020 Conclusions: 1. Severe three-vessel coronary artery disease, including 80-95% proximal/mid LAD, ostial and mid LCx, and mid RCA stenoses.  The proximal/mid LAD and ostial LCx lesions are heavily calcified. 2. Normal left ventricular systolic function and filling pressure. 3. Possible mild aortic stenosis. Recommendations: 1. Given unexplained syncope and recent high-risk stress test, we will admit Mr. Legrand for cardiac surgery consultation for CABG.  If he is not a  reasonable candidate for surgery or Mr. Ybarra declines CABG, multivessel PCI could be considered before discharge (this would likely require atherectomy of the LAD and ostial LCx). 2. Obtain echocardiogram. 3. Aggressive secondary prevention. Nelva Bush, MD [facility]  ECHOCARDIOGRAM COMPLETE  Result Date: 03/30/2020    ECHOCARDIOGRAM REPORT   Patient Name:   Gary Riggs Date of Exam: 03/30/2020 Medical Rec #:  [id number]        Height:       72.0 in Accession #:    [id number]       Weight:       190.0 lb Date of Birth:  06/13/1930        BSA:          2.085 m Patient Age:    86 years         BP:           142/73 mmHg Patient Gender: M                HR:           64 bpm. Exam Location:  Inpatient Procedure: 2D Echo, Cardiac Doppler and Color Doppler Indications:    Syncope  History:        Patient has no prior history of Echocardiogram examinations.                 Risk  Factors:Hypertension and Dyslipidemia.  Sonographer:    Clayton Lefort RDCS (AE) Referring Phys: [phone] Eugenie Harewood IMPRESSIONS  1. Left ventricular ejection fraction, by estimation, is 55 to 60%. The left ventricle has normal function. The left ventricle has no regional wall motion abnormalities. There is severe concentric left ventricular hypertrophy. Left ventricular diastolic  parameters are consistent with Grade II diastolic dysfunction (pseudonormalization).  2. Right ventricular systolic function is normal. The right ventricular size is normal. There is mildly elevated pulmonary artery systolic pressure.  3. Left atrial size was moderately dilated.  4. Right atrial size was mildly dilated.  5. Eccentric MR with horizontal splay; likely underestimation of regurgitation. The mitral valve is grossly normal. Mild mitral valve regurgitation.  6. The aortic valve is tricuspid. There is moderate calcification of the aortic valve. Aortic valve regurgitation is not visualized. Mild aortic valve sclerosis is present, with no evidence of aortic valve stenosis. Aortic valve area, by VTI measures 1.78 cm. Aortic valve mean gradient measures 8.3 mmHg. Aortic valve Vmax measures 1.91 m/s.  7. There is borderline dilatation of the ascending aorta, measuring 39 mm.  8. The inferior vena cava is normal in size with greater than 50% respiratory variability, suggesting right atrial pressure of 3 mmHg. Comparison(s): No prior Echocardiogram. FINDINGS  Left Ventricle: LVMI 154 g/m2; RWT 0.6. Left ventricular ejection fraction, by estimation, is 55 to 60%. The left ventricle has normal function. The left ventricle has no regional wall motion abnormalities. The left ventricular internal cavity size was normal in size. There is severe concentric left ventricular hypertrophy. Left ventricular diastolic parameters are consistent with Grade II diastolic dysfunction (pseudonormalization). Right Ventricle: The right ventricular size is  normal. No increase in right ventricular wall thickness. Right ventricular systolic function is normal. There is mildly elevated pulmonary artery systolic pressure. The tricuspid regurgitant velocity is 2.92  m/s, and with an assumed right atrial pressure of 3 mmHg, the estimated right ventricular systolic pressure is 59.5 mmHg. Left Atrium: Left atrial size was moderately dilated. Right Atrium: Right atrial size was mildly dilated. Pericardium: There is no  evidence of pericardial effusion. Mitral Valve: Eccentric MR with horizontal splay; likely underestimation of regurgitation. The mitral valve is grossly normal. There is moderate thickening of the mitral valve leaflet(s). Mild mitral valve regurgitation. MV peak gradient, 4.5 mmHg. The mean mitral valve gradient is 2.0 mmHg. Tricuspid Valve: The tricuspid valve is grossly normal. Tricuspid valve regurgitation is mild. Aortic Valve: The aortic valve is tricuspid. There is moderate calcification of the aortic valve. Aortic valve regurgitation is not visualized. Mild aortic valve sclerosis is present, with no evidence of aortic valve stenosis. Aortic valve mean gradient measures 8.3 mmHg. Aortic valve peak gradient measures 14.6 mmHg. Aortic valve area, by VTI measures 1.78 cm. Pulmonic Valve: The pulmonic valve was not well visualized. Pulmonic valve regurgitation is not visualized. Aorta: The aortic root is normal in size and structure. There is borderline dilatation of the ascending aorta, measuring 39 mm. Venous: A systolic blunting flow pattern is recorded from the right lower pulmonary vein. The inferior vena cava is normal in size with greater than 50% respiratory variability, suggesting right atrial pressure of 3 mmHg. IAS/Shunts: The atrial septum is grossly normal.  LEFT VENTRICLE PLAX 2D LVIDd:         5.00 cm  Diastology LVIDs:         3.30 cm  LV e' medial:    5.00 cm/s LV PW:         1.50 cm  LV E/e' medial:  14.2 LV IVS:        1.50 cm  LV e'  lateral:   7.29 cm/s LVOT diam:     2.40 cm  LV E/e' lateral: 9.7 LV SV:         81 LV SV Index:   39 LVOT Area:     4.52 cm  RIGHT VENTRICLE             IVC RV Basal diam:  3.10 cm     IVC diam: 1.00 cm RV S prime:     11.30 cm/s TAPSE (M-mode): 2.5 cm LEFT ATRIUM             Index       RIGHT ATRIUM           Index LA diam:        4.50 cm 2.16 cm/m  RA Area:     21.20 cm LA Vol (A2C):   95.8 ml 45.96 ml/m RA Volume:   59.00 ml  28.30 ml/m LA Vol (A4C):   71.1 ml 34.11 ml/m LA Biplane Vol: 83.2 ml 39.91 ml/m  AORTIC VALVE AV Area (Vmax):    1.85 cm AV Area (Vmean):   1.75 cm AV Area (VTI):     1.78 cm AV Vmax:           191.33 cm/s AV Vmean:          134.000 cm/s AV VTI:            0.452 m AV Peak Grad:      14.6 mmHg AV Mean Grad:      8.3 mmHg LVOT Vmax:         78.30 cm/s LVOT Vmean:        51.700 cm/s LVOT VTI:          0.178 m LVOT/AV VTI ratio: 0.39  AORTA Ao Root diam: 3.60 cm Ao Asc diam:  3.90 cm MITRAL VALVE  TRICUSPID VALVE MV Area (PHT): 2.76 cm     TR Peak grad:   34.1 mmHg MV Peak grad:  4.5 mmHg     TR Vmax:        292.00 cm/s MV Mean grad:  2.0 mmHg MV Vmax:       1.06 m/s     SHUNTS MV Vmean:      59.5 cm/s    Systemic VTI:  0.18 m MV Decel Time: 275 msec     Systemic Diam: 2.40 cm MV E velocity: 71.00 cm/s MV A velocity: 102.00 cm/s MV E/A ratio:  0.70 Rudean Haskell MD Electronically signed by Rudean Haskell MD Signature Date/Time: 03/30/2020/7:34:34 PM    Final     Patient Profile     86 y.o. male with history of hypertension, hyperlipidemia, and hypothyroidism, admitted after cardiac catheterization showed severe three-vessel coronary artery disease in the setting of recent syncope, exertional chest pain, and high risk stress test.  Assessment & Plan    Coronary artery disease: Patient is anatomy reviewed again with interventional cardiology team and is felt to be most suitable for surgical revascularization by CABG.  However, Mr. Santosuosso remains  opposed to surgery if at all possible.  We have discussed percutaneous revascularization options, which would be complex given his three-vessel CAD and widespread coronary artery calcification.  Despite understanding that PCI may offer incomplete revascularization, Mr. Graffam would like to move forward with this as soon as possible.  In reviewing the films with Dr. Irish Lack, we agree that PCI with atherectomy to the LAD and RCA would be the best initial treatment strategy.  We favor medical management of the LCx disease due to its complex nature involving the ostium as well as several side branches.  I have reviewed the risks, indications, and alternatives to cardiac catheterization with angioplasty, atherectomy, and stent placement with the patient and his daughter. Risks include but are not limited to bleeding, infection, vascular injury, stroke, myocardial infection, arrhythmia, kidney injury, radiation-related injury in the case of prolonged fluoroscopy use, emergency cardiac surgery, and death. The patient understands the risks of serious complication is 1-2 in 1610 with diagnostic cardiac cath and 1-2% with angioplasty/stenting.  Mr. Rueda would like to proceed.  I will load him with clopidogrel 600 mg x 1 this evening, to be followed by 75 mg daily thereafter.  I will switch him to clear liquid diet after midnight and n.p.o. after 10 AM tomorrow in anticipation of PCI with Dr. Irish Lack in the early afternoon.  For questions or updates, please contact [place] Please consult [email or web address] for contact info under     Signed, Nelva Bush, MD  03/30/2020, 7:47 PM

## 2020-03-30 NOTE — Interval H&P Note (Signed)
History and Physical Interval Note:  03/30/2020 7:28 AM  Nicholas Lose  has presented today for surgery, with the diagnosis of chest pain, syncope, and abnormal stress test.  The various methods of treatment have been discussed with the patient and family. After consideration of risks, benefits and other options for treatment, the patient has consented to  Procedure(s): LEFT HEART CATH AND CORONARY ANGIOGRAPHY (N/A) as a surgical intervention.  The patient's history has been reviewed, patient examined, no change in status, stable for surgery.  I have reviewed the patient's chart and labs.  Questions were answered to the patient's satisfaction.    Cath Lab Visit (complete for each Cath Lab visit)  Clinical Evaluation Leading to the Procedure:   ACS: No.  Non-ACS:    Anginal Classification: CCS III  Anti-ischemic medical therapy: No Therapy  Non-Invasive Test Results: High-risk stress test findings: cardiac mortality >3%/year  Prior CABG: No previous CABG  Ioannis Schuh

## 2020-03-30 NOTE — Progress Notes (Signed)
TR BAND REMOVAL  LOCATION:    Radial rt radial arterial site  DEFLATED PER PROTOCOL:   yes  TIME BAND OFF / DRESSING APPLIED:    1220/gauze and tegaderm  SITE UPON ARRIVAL:    Level 0  SITE AFTER BAND REMOVAL:    Level 0  CIRCULATION SENSATION AND MOVEMENT:    Within Normal Limits :  Rt hand and fingers warm and pink, palpable rt radial, sensation present  COMMENTS:   Instructions reviewed w/patient

## 2020-03-30 NOTE — H&P (View-Only) (Signed)
Progress Note  Patient Name: Gary Riggs Date of Encounter: 03/30/2020  [facility] Cardiologist: Buford Dresser, MD   Subjective   Patient feels well and is eager to have coronary revascularization.  He spoke with cardiac surgery today and is adamant that he would not like to undergo open heart surgery if at all possible.  He feels well at this time without chest pain or shortness of breath.  Inpatient Medications    Scheduled Meds: . [START ON 03/31/2020] aspirin  81 mg Oral Daily  . cholecalciferol  1,000 Units Oral Daily  . [START ON 03/31/2020] enoxaparin (LOVENOX) injection  40 mg Subcutaneous Q24H  . losartan  100 mg Oral Daily   And  . hydrochlorothiazide  12.5 mg Oral Daily  . [START ON 03/31/2020] levothyroxine  137 mcg Oral QAC breakfast  . simvastatin  40 mg Oral Daily  . sodium chloride flush  3 mL Intravenous Q12H  . sodium chloride flush  3 mL Intravenous Q12H  . tamsulosin  0.4 mg Oral QHS   Continuous Infusions: . sodium chloride     PRN Meds: sodium chloride, acetaminophen, nitroGLYCERIN, ondansetron (ZOFRAN) IV, sodium chloride flush   Vital Signs    Vitals:   03/30/20 1130 03/30/20 1230 03/30/20 1330 03/30/20 1452  BP: (!) 109/40 (!) 157/62 (!) 151/56 (!) 142/73  Pulse: (!) 59 71 66 64  Resp: 17 12 18 17   Temp:    98.3 F (36.8 C)  TempSrc:    Oral  SpO2: 99% 96% 96% 99%  Weight:      Height:        Intake/Output Summary (Last 24 hours) at 03/30/2020 1947 Last data filed at 03/30/2020 1500 Gross per 24 hour  Intake 723.75 ml  Output 800 ml  Net -76.25 ml   Last 3 Weights 03/30/2020 03/24/2020 01/19/2015  Weight (lbs) 190 lb 193 lb 195 lb 6.4 oz  Weight (kg) 86.183 kg 87.544 kg 88.633 kg       Physical Exam   GEN: No acute distress.    Labs    High Sensitivity Troponin:  No results for input(s): TROPONINIHS in the last 720 hours.    Chemistry Recent Labs  Lab 03/26/20 1136 03/30/20 1459  NA 132*  --   K 4.8   --   CL 97  --   CO2 26  --   GLUCOSE 94  --   BUN 18  --   CREATININE 0.97 0.98  CALCIUM 9.5  --   GFRNONAA 70 >60  GFRAA 81  --      Hematology Recent Labs  Lab 03/26/20 1136 03/30/20 1459  WBC 6.3 9.9  RBC 4.04* 3.97*  HGB 12.8* 12.8*  HCT 38.6 39.3  MCV 96 99.0  MCH 31.7 32.2  MCHC 33.2 32.6  RDW 11.6 12.5  PLT 291 260    BNPNo results for input(s): BNP, PROBNP in the last 168 hours.   DDimer No results for input(s): DDIMER in the last 168 hours.   Radiology    CARDIAC CATHETERIZATION  Result Date: 03/30/2020 Conclusions: 1. Severe three-vessel coronary artery disease, including 80-95% proximal/mid LAD, ostial and mid LCx, and mid RCA stenoses.  The proximal/mid LAD and ostial LCx lesions are heavily calcified. 2. Normal left ventricular systolic function and filling pressure. 3. Possible mild aortic stenosis. Recommendations: 1. Given unexplained syncope and recent high-risk stress test, we will admit Gary Riggs for cardiac surgery consultation for CABG.  If he is not a  reasonable candidate for surgery or Gary Riggs declines CABG, multivessel PCI could be considered before discharge (this would likely require atherectomy of the LAD and ostial LCx). 2. Obtain echocardiogram. 3. Aggressive secondary prevention. Nelva Bush, MD [facility]  ECHOCARDIOGRAM COMPLETE  Result Date: 03/30/2020    ECHOCARDIOGRAM REPORT   Patient Name:   Gary Riggs Date of Exam: 03/30/2020 Medical Rec #:  [id number]        Height:       72.0 in Accession #:    [id number]       Weight:       190.0 lb Date of Birth:  1930-10-20        BSA:          2.085 m Patient Age:    86 years         BP:           142/73 mmHg Patient Gender: M                HR:           64 bpm. Exam Location:  Inpatient Procedure: 2D Echo, Cardiac Doppler and Color Doppler Indications:    Syncope  History:        Patient has no prior history of Echocardiogram examinations.                 Risk  Factors:Hypertension and Dyslipidemia.  Sonographer:    Clayton Lefort RDCS (AE) Referring Phys: [phone] Arla Boutwell IMPRESSIONS  1. Left ventricular ejection fraction, by estimation, is 55 to 60%. The left ventricle has normal function. The left ventricle has no regional wall motion abnormalities. There is severe concentric left ventricular hypertrophy. Left ventricular diastolic  parameters are consistent with Grade II diastolic dysfunction (pseudonormalization).  2. Right ventricular systolic function is normal. The right ventricular size is normal. There is mildly elevated pulmonary artery systolic pressure.  3. Left atrial size was moderately dilated.  4. Right atrial size was mildly dilated.  5. Eccentric MR with horizontal splay; likely underestimation of regurgitation. The mitral valve is grossly normal. Mild mitral valve regurgitation.  6. The aortic valve is tricuspid. There is moderate calcification of the aortic valve. Aortic valve regurgitation is not visualized. Mild aortic valve sclerosis is present, with no evidence of aortic valve stenosis. Aortic valve area, by VTI measures 1.78 cm. Aortic valve mean gradient measures 8.3 mmHg. Aortic valve Vmax measures 1.91 m/s.  7. There is borderline dilatation of the ascending aorta, measuring 39 mm.  8. The inferior vena cava is normal in size with greater than 50% respiratory variability, suggesting right atrial pressure of 3 mmHg. Comparison(s): No prior Echocardiogram. FINDINGS  Left Ventricle: LVMI 154 g/m2; RWT 0.6. Left ventricular ejection fraction, by estimation, is 55 to 60%. The left ventricle has normal function. The left ventricle has no regional wall motion abnormalities. The left ventricular internal cavity size was normal in size. There is severe concentric left ventricular hypertrophy. Left ventricular diastolic parameters are consistent with Grade II diastolic dysfunction (pseudonormalization). Right Ventricle: The right ventricular size is  normal. No increase in right ventricular wall thickness. Right ventricular systolic function is normal. There is mildly elevated pulmonary artery systolic pressure. The tricuspid regurgitant velocity is 2.92  m/s, and with an assumed right atrial pressure of 3 mmHg, the estimated right ventricular systolic pressure is 61.9 mmHg. Left Atrium: Left atrial size was moderately dilated. Right Atrium: Right atrial size was mildly dilated. Pericardium: There is no  evidence of pericardial effusion. Mitral Valve: Eccentric MR with horizontal splay; likely underestimation of regurgitation. The mitral valve is grossly normal. There is moderate thickening of the mitral valve leaflet(s). Mild mitral valve regurgitation. MV peak gradient, 4.5 mmHg. The mean mitral valve gradient is 2.0 mmHg. Tricuspid Valve: The tricuspid valve is grossly normal. Tricuspid valve regurgitation is mild. Aortic Valve: The aortic valve is tricuspid. There is moderate calcification of the aortic valve. Aortic valve regurgitation is not visualized. Mild aortic valve sclerosis is present, with no evidence of aortic valve stenosis. Aortic valve mean gradient measures 8.3 mmHg. Aortic valve peak gradient measures 14.6 mmHg. Aortic valve area, by VTI measures 1.78 cm. Pulmonic Valve: The pulmonic valve was not well visualized. Pulmonic valve regurgitation is not visualized. Aorta: The aortic root is normal in size and structure. There is borderline dilatation of the ascending aorta, measuring 39 mm. Venous: A systolic blunting flow pattern is recorded from the right lower pulmonary vein. The inferior vena cava is normal in size with greater than 50% respiratory variability, suggesting right atrial pressure of 3 mmHg. IAS/Shunts: The atrial septum is grossly normal.  LEFT VENTRICLE PLAX 2D LVIDd:         5.00 cm  Diastology LVIDs:         3.30 cm  LV e' medial:    5.00 cm/s LV PW:         1.50 cm  LV E/e' medial:  14.2 LV IVS:        1.50 cm  LV e'  lateral:   7.29 cm/s LVOT diam:     2.40 cm  LV E/e' lateral: 9.7 LV SV:         81 LV SV Index:   39 LVOT Area:     4.52 cm  RIGHT VENTRICLE             IVC RV Basal diam:  3.10 cm     IVC diam: 1.00 cm RV S prime:     11.30 cm/s TAPSE (M-mode): 2.5 cm LEFT ATRIUM             Index       RIGHT ATRIUM           Index LA diam:        4.50 cm 2.16 cm/m  RA Area:     21.20 cm LA Vol (A2C):   95.8 ml 45.96 ml/m RA Volume:   59.00 ml  28.30 ml/m LA Vol (A4C):   71.1 ml 34.11 ml/m LA Biplane Vol: 83.2 ml 39.91 ml/m  AORTIC VALVE AV Area (Vmax):    1.85 cm AV Area (Vmean):   1.75 cm AV Area (VTI):     1.78 cm AV Vmax:           191.33 cm/s AV Vmean:          134.000 cm/s AV VTI:            0.452 m AV Peak Grad:      14.6 mmHg AV Mean Grad:      8.3 mmHg LVOT Vmax:         78.30 cm/s LVOT Vmean:        51.700 cm/s LVOT VTI:          0.178 m LVOT/AV VTI ratio: 0.39  AORTA Ao Root diam: 3.60 cm Ao Asc diam:  3.90 cm MITRAL VALVE  TRICUSPID VALVE MV Area (PHT): 2.76 cm     TR Peak grad:   34.1 mmHg MV Peak grad:  4.5 mmHg     TR Vmax:        292.00 cm/s MV Mean grad:  2.0 mmHg MV Vmax:       1.06 m/s     SHUNTS MV Vmean:      59.5 cm/s    Systemic VTI:  0.18 m MV Decel Time: 275 msec     Systemic Diam: 2.40 cm MV E velocity: 71.00 cm/s MV A velocity: 102.00 cm/s MV E/A ratio:  0.70 Rudean Haskell MD Electronically signed by Rudean Haskell MD Signature Date/Time: 03/30/2020/7:34:34 PM    Final     Patient Profile     86 y.o. male with history of hypertension, hyperlipidemia, and hypothyroidism, admitted after cardiac catheterization showed severe three-vessel coronary artery disease in the setting of recent syncope, exertional chest pain, and high risk stress test.  Assessment & Plan    Coronary artery disease: Patient is anatomy reviewed again with interventional cardiology team and is felt to be most suitable for surgical revascularization by CABG.  However, Mr. Sage remains  opposed to surgery if at all possible.  We have discussed percutaneous revascularization options, which would be complex given his three-vessel CAD and widespread coronary artery calcification.  Despite understanding that PCI may offer incomplete revascularization, Mr. Ferris would like to move forward with this as soon as possible.  In reviewing the films with Dr. Irish Lack, we agree that PCI with atherectomy to the LAD and RCA would be the best initial treatment strategy.  We favor medical management of the LCx disease due to its complex nature involving the ostium as well as several side branches.  I have reviewed the risks, indications, and alternatives to cardiac catheterization with angioplasty, atherectomy, and stent placement with the patient and his daughter. Risks include but are not limited to bleeding, infection, vascular injury, stroke, myocardial infection, arrhythmia, kidney injury, radiation-related injury in the case of prolonged fluoroscopy use, emergency cardiac surgery, and death. The patient understands the risks of serious complication is 1-2 in 4680 with diagnostic cardiac cath and 1-2% with angioplasty/stenting.  Mr. Mckillop would like to proceed.  I will load him with clopidogrel 600 mg x 1 this evening, to be followed by 75 mg daily thereafter.  I will switch him to clear liquid diet after midnight and n.p.o. after 10 AM tomorrow in anticipation of PCI with Dr. Irish Lack in the early afternoon.  For questions or updates, please contact [place] Please consult [email or web address] for contact info under     Signed, Nelva Bush, MD  03/30/2020, 7:47 PM

## 2020-03-30 NOTE — Consult Note (Addendum)
[address]       [place] [address]             [phone]        Avraj F Schroeter Harriman Medical Record #[id number] Date of Birth: 01/14/1931  Referring: No ref. provider found Primary Care: Josetta Huddle, MD Primary Cardiologist:Bridgette Harrell Gave, MD  Chief Complaint:   Syncope, positive stress test.  History of Present Illness:   Mr. Cansler is a very pleasant 86 year old gentleman with a past medical history significant for dyslipidemia, hypertension, hypothyroidism, status post radioactive iodine for Graves' disease, and benign prostatic hyperplasia.  He had a syncopal episode while in church about 3 weeks ago where he lost consciousness for about 5 minutes.  He also recently developed a brief episode of mid sternal chest pain while walking up an incline ramp at [employer].  These symptoms prompted visit to his primary care physician, Dr. Josetta Huddle.  A cardiac stress test was performed and was significantly positive with ST depressions in leads II, III, aVF, and the leads 4 through 6.  He was referred to cardiology for further evaluation.  Elective left heart catheterization was performed earlier today and demonstrates severe three-vessel coronary artery disease.  He has a 90% stenosis in the proximal LAD.  There is a 90% stenosis of the ostial circumflex with a 95% stenosis distal to that.  The mid right coronary has an 80% stenosis.  Left ventricular function is well-preserved.  We have been asked to evaluate Mr. Marta Lamas for to assist in determining most appropriate pathway for revascularization. Basic labs obtained a few days ago including a white count of 6300, hemoglobin 12.8 g hematocrit 38.6%,  platelet count 291,000, electrolytes were within normal limits.  Creatinine was 0.97. Mr. Flo Shanks is otherwise healthy and has remained quite active.  He continues to work part-time in [employer].  He frequently rides a bicycle for 1 to 2 hours at a time. He  clearly stated on several occasions during this interview, with his daughter at the bedside, that he is much more interested in quality of life than length of life at this stage.    Current Activity/ Functional Status:   Zubrod Score: At the time of surgery this patient's most appropriate activity status/level should be described as: []     0    Normal activity, no symptoms [x]     1    Restricted in physical strenuous activity but ambulatory, able to do out light work []     2    Ambulatory and capable of self care, unable to do work activities, up and about                 more than 50%  Of the time                            []     3    Only limited self care, in bed greater than 50% of waking hours []     4    Completely disabled, no self care, confined to bed or chair []     5    Moribund  Past Medical History:  Diagnosis Date  . Diverticulosis   . DJD (degenerative joint disease)   . ED (erectile dysfunction)   . Hemorrhoids   . Hiatal hernia   . Hypertension   . Onychomycosis   . Rosacea   . Thyroid disease  No past surgical history on file.  Social History   Tobacco Use  Smoking Status Never Smoker  Smokeless Tobacco Never Used    Social History   Substance and Sexual Activity  Alcohol Use No     Allergies  Allergen Reactions  . Aleve [Naproxen Sodium]     angioedema  . Synthroid [Levothyroxine Sodium]     Not tolerated name brand  . Tussionex Pennkinetic Er [Hydrocod Polst-Cpm Polst Er] Other (See Comments)    Face swelling    Current Facility-Administered Medications  Medication Dose Route Frequency Provider Last Rate Last Admin  . 0.9 %  sodium chloride infusion  250 mL Intravenous PRN Buford Dresser, MD      . 0.9 %  sodium chloride infusion  250 mL Intravenous PRN End, Harrell Gave, MD      . 0.9% sodium chloride infusion  1 mL/kg/hr Intravenous Continuous Buford Dresser, MD 87.5 mL/hr at 03/30/20 0718 1 mL/kg/hr at 03/30/20 0718  .  aspirin chewable tablet 81 mg  81 mg Oral Berniece Andreas, Shawna Orleans, MD      . hydrALAZINE (APRESOLINE) injection 10 mg  10 mg Intravenous Q20 Min PRN End, Harrell Gave, MD   10 mg at 03/30/20 0841  . labetalol (NORMODYNE) injection 10 mg  10 mg Intravenous Q10 min PRN End, Harrell Gave, MD      . sodium chloride flush (NS) 0.9 % injection 3 mL  3 mL Intravenous Q12H Buford Dresser, MD      . sodium chloride flush (NS) 0.9 % injection 3 mL  3 mL Intravenous PRN Buford Dresser, MD      . sodium chloride flush (NS) 0.9 % injection 3 mL  3 mL Intravenous Q12H End, Christopher, MD      . sodium chloride flush (NS) 0.9 % injection 3 mL  3 mL Intravenous PRN End, Christopher, MD        Medications Prior to Admission  Medication Sig Dispense Refill Last Dose  . Ascorbic Acid (VITAMIN C) 1000 MG tablet Take 1,000 mg by mouth daily.   03/29/2020 at Unknown time  . aspirin 81 MG chewable tablet Chew 81 mg by mouth daily.    03/30/2020 at 0430  . Cholecalciferol (VITAMIN D-3) 1000 UNITS CAPS Take 1,000 Units by mouth daily.    03/29/2020 at Unknown time  . Cyanocobalamin (VITAMIN B-12 PO) Take 2,500 mcg by mouth daily.    03/29/2020 at Unknown time  . GLUCOS-CHONDROIT-MSM-C-HYAL PO Take 1 tablet by mouth daily. 1500/1200   03/29/2020 at Unknown time  . levothyroxine (SYNTHROID) 137 MCG tablet Take 137 mcg by mouth daily before breakfast.    03/29/2020 at Unknown time  . losartan-hydrochlorothiazide (HYZAAR) 100-12.5 MG per tablet Take 1 tablet by mouth daily.   03/29/2020 at Unknown time  . Multiple Minerals-Vitamins (CALCIUM CITRATE-MAG-MINERALS PO) Take 1 tablet by mouth daily. Vit D3/ Zinc   03/29/2020 at Unknown time  . Multiple Vitamin (MULTIVITAMIN) capsule Take 1 capsule by mouth daily.   03/29/2020 at Unknown time  . nitroGLYCERIN (NITROSTAT) 0.4 MG SL tablet Place 0.4 mg under the tongue every 5 (five) minutes as needed for chest pain.     Marland Kitchen OVER THE COUNTER MEDICATION Take 1  capsule by mouth daily. Brain Logic   03/29/2020 at Unknown time  . RESVERATROL PO Take 100 mg by mouth daily. Omega Q plus   03/29/2020 at Unknown time  . simvastatin (ZOCOR) 40 MG tablet Take 40 mg by mouth daily.   03/29/2020 at Unknown  time  . tamsulosin (FLOMAX) 0.4 MG CAPS capsule Take 0.4 mg by mouth at bedtime.    03/29/2020 at Unknown time    Family History  Family history unknown: Yes     Review of Systems:   ROS     Cardiac Review of Systems: Y or  [    ]= no  Chest Pain [ x   ]  Resting SOB [   ] Exertional SOB  [x  ]  Orthopnea [  ]   Pedal Edema [   ]    Palpitations [  ] Syncope  [x  ]   Presyncope [   ]  General Review of Systems: [Y] = yes [  ]=no Constitional: recent weight change [  ]; anorexia [  ]; fatigue [  ]; nausea [  ]; night sweats [  ]; fever [  ]; or chills [  ]                                                               Dental: Last Dentist visit: within past 6 month Eye : blurred vision [  ]; diplopia [   ]; vision changes [  ];  Amaurosis fugax[  ]; Resp: cough [  ];  wheezing[  ];  hemoptysis[  ]; shortness of breath[  ]; paroxysmal nocturnal dyspnea[  ]; dyspnea on exertion[  ]; or orthopnea[  ];  GI:  gallstones[  ], vomiting[  ];  dysphagia[  ]; melena[  ];  hematochezia [  ]; heartburn[  ];   Hx of  Colonoscopy[  ]; GU: kidney stones [  ]; hematuria[  ];   dysuria [  ];  nocturia[  ];  history of     obstruction [  ]; urinary frequency [  ]             Skin: rash, swelling[  ];, hair loss[  ];  peripheral edema[  ];  or itching[  ]; Musculosketetal: myalgias[  ];  joint swelling[  ];  joint erythema[  ];  joint pain[  ];  back pain[  ];  Heme/Lymph: bruising[  ];  bleeding[  ];  anemia[  ];  Neuro: TIA[  ];  headaches[  ];  stroke[  ];  vertigo[  ];  seizures[  ];   paresthesias[  ];  difficulty walking[  ];  Psych:depression[  ]; anxiety[  ];  Endocrine: diabetes[  ];  thyroid dysfunction[  ];          Physical Exam: BP (!) 151/56   Pulse  66   Temp 97.9 F (36.6 C) (Oral)   Resp 18   Ht 6' (1.829 m)   Wt 86.2 kg   SpO2 96%   BMI 25.77 kg/m     General appearance: alert, cooperative and no distress Head: Normocephalic, without obvious abnormality, atraumatic Neck: no adenopathy, no carotid bruit, no JVD, supple, symmetrical, trachea midline and thyroid not enlarged, symmetric, no tenderness/mass/nodules Lymph nodes: Cervical, supraclavicular, and axillary nodes normal. Resp: clear to auscultation bilaterally Cardio: regular rate and rhythm and There is a systolic murmur heard throughout the precordium. GI: Soft and nontender.  Active bowel sounds. Extremities: All warm and well-perfused.  All peripheral pulses are easily palpable.  There is a pressure dressing over the right radial artery following removal of the TR band.  He has no peripheral edema. Neurologic: Alert and oriented X 3, normal strength and tone. Normal symmetric reflexes. Normal coordination and gait  Diagnostic Studies & Laboratory data:     Recent Radiology Findings:   CARDIAC CATHETERIZATION  Result Date: 03/30/2020 Conclusions: 1. Severe three-vessel coronary artery disease, including 80-95% proximal/mid LAD, ostial and mid LCx, and mid RCA stenoses.  The proximal/mid LAD and ostial LCx lesions are heavily calcified. 2. Normal left ventricular systolic function and filling pressure. 3. Possible mild aortic stenosis. Recommendations: 1. Given unexplained syncope and recent high-risk stress test, we will admit Mr. Retz for cardiac surgery consultation for CABG.  If he is not a reasonable candidate for surgery or Mr. Mossberg declines CABG, multivessel PCI could be considered before discharge (this would likely require atherectomy of the LAD and ostial LCx). 2. Obtain echocardiogram. 3. Aggressive secondary prevention. Nelva Bush, MD [facility]    I have independently reviewed the above radiologic studies and discussed with the patient   Recent  Lab Findings: Lab Results  Component Value Date   WBC 6.3 03/26/2020   HGB 12.8 (L) 03/26/2020   HCT 38.6 03/26/2020   PLT 291 03/26/2020   GLUCOSE 94 03/26/2020   ALT 18 02/02/2018   AST 24 02/02/2018   NA 132 (L) 03/26/2020   K 4.8 03/26/2020   CL 97 03/26/2020   CREATININE 0.97 03/26/2020   BUN 18 03/26/2020   CO2 26 03/26/2020      Assessment / Plan:    Mr. Ludington is a very pleasant and active 86 year old male with severe multivessel coronary artery disease discovered after left heart catheterization that was prompted by a recent syncopal episode and a single episode of exertional chest pain. ( I do not see evidence of a formal work-up for his syncope that would include CT brain scan and cardiac rhythm monitoring.)  He has well-preserved LV function and the target anatomy demonstrated on angiography appears appropriate for bypass grafting.  He does not have any significant comorbidities, other than advanced age, that would preclude his having coronary bypass surgery.  He does have a systolic murmur on physical exam that has not been worked up yet.  An echo has been ordered and is pending at this time.  Mr. Burkey and his daughter both seem very eager to consider the least invasive approach to managing his coronary disease and this is certainly appropriate at age 89.    We will follow up with the work-up including echocardiogram and CXR and discuss further. Dr. Orvan Seen will see Mr. Dipiero later today.    I  spent 25 minutes counseling the patient face to face.   Antony Odea, PA-C  03/30/2020 2:15 PM   Pt seen and examined; chart and films reviewed. Agree with documentation. He vehemently opposes CABG; suggest proceeding with other therapeutic options. Deandrea Rion Z. Orvan Seen, [place]

## 2020-03-31 ENCOUNTER — Encounter (HOSPITAL_COMMUNITY)
Admission: RE | Disposition: A | Discharge status: Home / Self Care | Payer: Self-pay | Source: Ambulatory Visit | Attending: Internal Medicine

## 2020-03-31 ENCOUNTER — Encounter (HOSPITAL_COMMUNITY): Payer: Self-pay | Admitting: Internal Medicine

## 2020-03-31 DIAGNOSIS — I2510 Atherosclerotic heart disease of native coronary artery without angina pectoris: Secondary | ICD-10-CM

## 2020-03-31 DIAGNOSIS — I25118 Atherosclerotic heart disease of native coronary artery with other forms of angina pectoris: Secondary | ICD-10-CM | POA: Diagnosis not present

## 2020-03-31 HISTORY — PX: LEFT HEART CATH: CATH118248

## 2020-03-31 HISTORY — PX: INTRAVASCULAR ULTRASOUND/IVUS: CATH118244

## 2020-03-31 HISTORY — PX: CORONARY STENT INTERVENTION: CATH118234

## 2020-03-31 LAB — CBC
HCT: 35.8 % — ABNORMAL LOW (ref 39.0–52.0)
Hemoglobin: 11.9 g/dL — ABNORMAL LOW (ref 13.0–17.0)
MCH: 32.6 pg (ref 26.0–34.0)
MCHC: 33.2 g/dL (ref 30.0–36.0)
MCV: 98.1 fL (ref 80.0–100.0)
Platelets: 263 10*3/uL (ref 150–400)
RBC: 3.65 MIL/uL — ABNORMAL LOW (ref 4.22–5.81)
RDW: 12.5 % (ref 11.5–15.5)
WBC: 8.7 10*3/uL (ref 4.0–10.5)
nRBC: 0 % (ref 0.0–0.2)

## 2020-03-31 LAB — LIPID PANEL
Cholesterol: 165 mg/dL (ref 0–200)
HDL: 45 mg/dL (ref >40)
LDL Cholesterol: 104 mg/dL — ABNORMAL HIGH (ref 0–99)
Total CHOL/HDL Ratio: 3.7 RATIO
Triglycerides: 82 mg/dL (ref <150)
VLDL: 16 mg/dL (ref 0–40)

## 2020-03-31 LAB — BASIC METABOLIC PANEL
Anion gap: 11 (ref 5–15)
BUN: 16 mg/dL (ref 8–23)
CO2: 24 mmol/L (ref 22–32)
Calcium: 9 mg/dL (ref 8.9–10.3)
Chloride: 99 mmol/L (ref 98–111)
Creatinine, Ser: 0.97 mg/dL (ref 0.61–1.24)
GFR, Estimated: >60 mL/min (ref >60)
Glucose, Bld: 101 mg/dL — ABNORMAL HIGH (ref 70–99)
Potassium: 4.4 mmol/L (ref 3.5–5.1)
Sodium: 134 mmol/L — ABNORMAL LOW (ref 135–145)

## 2020-03-31 LAB — POCT ACTIVATED CLOTTING TIME
Activated Clotting Time: 224 seconds
Activated Clotting Time: 257 seconds
Activated Clotting Time: 329 seconds
Activated Clotting Time: 246 seconds

## 2020-03-31 SURGERY — CORONARY STENT INTERVENTION
Anesthesia: LOCAL

## 2020-03-31 MED ORDER — HEPARIN (PORCINE) IN NACL 1000-0.9 UT/500ML-% IV SOLN
INTRAVENOUS | Status: AC
  Filled 2020-03-31: qty 1000

## 2020-03-31 MED ORDER — ONDANSETRON HCL 4 MG/2ML IJ SOLN: 4.0000 mg | Freq: Four times a day (QID) | INTRAMUSCULAR | Status: DC | PRN

## 2020-03-31 MED ORDER — LABETALOL HCL 5 MG/ML IV SOLN: 10.0000 mg | INTRAVENOUS | Status: AC | PRN

## 2020-03-31 MED ORDER — SODIUM CHLORIDE 0.9 % IV SOLN: 250.0000 mL | INTRAVENOUS | Status: DC | PRN

## 2020-03-31 MED ORDER — CLOPIDOGREL BISULFATE 75 MG PO TABS
75.0000 mg | ORAL_TABLET | Freq: Every day | ORAL | Status: DC
  Administered 2020-04-01: 75 mg via ORAL
  Filled 2020-03-31: qty 1

## 2020-03-31 MED ORDER — VERAPAMIL HCL 2.5 MG/ML IV SOLN
INTRAVENOUS | Status: DC | PRN
  Administered 2020-03-31: 10 mL via INTRA_ARTERIAL

## 2020-03-31 MED ORDER — FENTANYL CITRATE (PF) 100 MCG/2ML IJ SOLN
INTRAMUSCULAR | Status: AC
  Filled 2020-03-31: qty 2

## 2020-03-31 MED ORDER — IOHEXOL 350 MG/ML SOLN
INTRAVENOUS | Status: AC
  Filled 2020-03-31: qty 1

## 2020-03-31 MED ORDER — ASPIRIN 81 MG PO CHEW
81.0000 mg | CHEWABLE_TABLET | Freq: Every day | ORAL | Status: DC
  Administered 2020-04-01: 81 mg via ORAL
  Filled 2020-03-31: qty 1

## 2020-03-31 MED ORDER — NITROGLYCERIN 1 MG/10 ML FOR IR/CATH LAB
INTRA_ARTERIAL | Status: DC | PRN
  Administered 2020-03-31: 200 ug via INTRA_ARTERIAL
  Administered 2020-03-31 (×3): 200 ug via INTRACORONARY

## 2020-03-31 MED ORDER — HEPARIN SODIUM (PORCINE) 1000 UNIT/ML IJ SOLN
INTRAMUSCULAR | Status: AC
  Filled 2020-03-31: qty 1

## 2020-03-31 MED ORDER — FENTANYL CITRATE (PF) 100 MCG/2ML IJ SOLN
INTRAMUSCULAR | Status: DC | PRN
Start: 2020-03-31 — End: 2020-03-31
  Administered 2020-03-31 (×2): 25 ug via INTRAVENOUS

## 2020-03-31 MED ORDER — SODIUM CHLORIDE 0.9% FLUSH: 3.0000 mL | INTRAVENOUS | Status: DC | PRN

## 2020-03-31 MED ORDER — NITROGLYCERIN 1 MG/10 ML FOR IR/CATH LAB
INTRA_ARTERIAL | Status: AC
  Filled 2020-03-31: qty 10

## 2020-03-31 MED ORDER — LIDOCAINE HCL (PF) 1 % IJ SOLN
INTRAMUSCULAR | Status: AC
  Filled 2020-03-31: qty 30

## 2020-03-31 MED ORDER — VERAPAMIL HCL 2.5 MG/ML IV SOLN
INTRAVENOUS | Status: AC
  Filled 2020-03-31: qty 2

## 2020-03-31 MED ORDER — HEPARIN (PORCINE) IN NACL 1000-0.9 UT/500ML-% IV SOLN
INTRAVENOUS | Status: DC | PRN
  Administered 2020-03-31 (×2): 500 mL

## 2020-03-31 MED ORDER — IOHEXOL 350 MG/ML SOLN
INTRAVENOUS | Status: DC | PRN
  Administered 2020-03-31: 160 mL

## 2020-03-31 MED ORDER — ACETAMINOPHEN 325 MG PO TABS: 650.0000 mg | ORAL_TABLET | ORAL | Status: DC | PRN

## 2020-03-31 MED ORDER — HEPARIN SODIUM (PORCINE) 1000 UNIT/ML IJ SOLN
INTRAMUSCULAR | Status: DC | PRN
  Administered 2020-03-31: 4000 [IU] via INTRAVENOUS
  Administered 2020-03-31: 9000 [IU] via INTRAVENOUS
  Administered 2020-03-31: 4000 [IU] via INTRAVENOUS

## 2020-03-31 MED ORDER — MIDAZOLAM HCL 2 MG/2ML IJ SOLN
INTRAMUSCULAR | Status: DC | PRN
  Administered 2020-03-31 (×2): 1 mg via INTRAVENOUS

## 2020-03-31 MED ORDER — SODIUM CHLORIDE 0.9 % IV SOLN: INTRAVENOUS | Status: AC

## 2020-03-31 MED ORDER — SODIUM CHLORIDE 0.9% FLUSH
3.0000 mL | Freq: Two times a day (BID) | INTRAVENOUS | Status: DC
  Administered 2020-03-31 – 2020-04-01 (×3): 3 mL via INTRAVENOUS

## 2020-03-31 MED ORDER — HYDRALAZINE HCL 20 MG/ML IJ SOLN: 10.0000 mg | INTRAMUSCULAR | Status: AC | PRN

## 2020-03-31 MED ORDER — LIDOCAINE HCL (PF) 1 % IJ SOLN
INTRAMUSCULAR | Status: DC | PRN
  Administered 2020-03-31: 2 mL

## 2020-03-31 MED ORDER — VIPERSLIDE LUBRICANT OPTIME
TOPICAL | Status: DC | PRN
  Administered 2020-03-31: 20 mL via SURGICAL_CAVITY

## 2020-03-31 MED ORDER — MIDAZOLAM HCL 2 MG/2ML IJ SOLN
INTRAMUSCULAR | Status: AC
  Filled 2020-03-31: qty 2

## 2020-03-31 SURGICAL SUPPLY — 32 items
BALLN SAPPHIRE 2.0X15 (BALLOONS) ×2
BALLN SAPPHIRE 3.0X15 (BALLOONS) ×2
BALLN SAPPHIRE ~~LOC~~ 3.25X15 (BALLOONS) ×1 IMPLANT
BALLN SAPPHIRE ~~LOC~~ 3.75X12 (BALLOONS) ×1 IMPLANT
BALLOON SAPPHIRE 2.0X15 (BALLOONS) IMPLANT
BALLOON SAPPHIRE 3.0X15 (BALLOONS) IMPLANT
CATH INFINITI JR4 5F (CATHETERS) ×1 IMPLANT
CATH LAUNCHER 6FR AL1 (CATHETERS) IMPLANT
CATH LAUNCHER 6FR AL1 SH (CATHETERS) IMPLANT
CATH LAUNCHER 6FR EBU 3.75 (CATHETERS) ×1 IMPLANT
CATH OPTICROSS HD (CATHETERS) ×1 IMPLANT
CATHETER LAUNCHER 6FR AL1 (CATHETERS) ×2
CATHETER LAUNCHER 6FR AL1 SH (CATHETERS) ×2
CROWN DIAMONDBACK CLASSIC 1.25 (BURR) ×1 IMPLANT
DEVICE RAD COMP TR BAND LRG (VASCULAR PRODUCTS) ×1 IMPLANT
ELECT DEFIB PAD ADLT CADENCE (PAD) ×2 IMPLANT
GLIDESHEATH SLEND SS 6F .021 (SHEATH) ×1 IMPLANT
GUIDEWIRE INQWIRE 1.5J.035X260 (WIRE) IMPLANT
INQWIRE 1.5J .035X260CM (WIRE) ×2
KIT ENCORE 26 ADVANTAGE (KITS) ×1 IMPLANT
KIT HEART LEFT (KITS) ×2 IMPLANT
KIT HEMO VALVE WATCHDOG (MISCELLANEOUS) ×1 IMPLANT
LUBRICANT VIPERSLIDE CORONARY (MISCELLANEOUS) ×1 IMPLANT
PACK CARDIAC CATHETERIZATION (CUSTOM PROCEDURE TRAY) ×2 IMPLANT
SLED PULL BACK IVUS (MISCELLANEOUS) ×1 IMPLANT
STENT RESOLUTE ONYX 2.75X34 (Permanent Stent) ×1 IMPLANT
STENT RESOLUTE ONYX 3.5X15 (Permanent Stent) ×1 IMPLANT
TRANSDUCER W/STOPCOCK (MISCELLANEOUS) ×2 IMPLANT
TUBING ART PRESS 72  MALE/MALE (TUBING) ×1 IMPLANT
TUBING CIL FLEX 10 FLL-RA (TUBING) ×2 IMPLANT
WIRE ASAHI GRAND SLAM 180CM (WIRE) ×1 IMPLANT
WIRE VIPERWIRE COR FLEX .012 (WIRE) ×1 IMPLANT

## 2020-03-31 NOTE — Discharge Summary (Signed)
Discharge Summary    Patient ID: RANVIR RENOVATO MRN: [id number]; DOB: January 11, 1931  Admit date: 03/30/2020 Discharge date: 04/01/2020  Primary Care Provider: Josetta Huddle, MD  Primary Cardiologist: Buford Dresser, MD  Primary Electrophysiologist:  None   Discharge Diagnoses    Principal Problem:   Chest pain of uncertain etiology Active Problems:   Syncope and collapse   Abnormal stress test   Coronary artery disease    Diagnostic Studies/Procedures    Cath: 03/30/20  Conclusions: 1. Severe three-vessel coronary artery disease, including 80-95% proximal/mid LAD, ostial and mid LCx, and mid RCA stenoses.  The proximal/mid LAD and ostial LCx lesions are heavily calcified. 2. Normal left ventricular systolic function and filling pressure. 3. Possible mild aortic stenosis.  Recommendations: 1. Given unexplained syncope and recent high-risk stress test, we will admit Mr. Rhames for cardiac surgery consultation for CABG.  If he is not a reasonable candidate for surgery or Mr. Cansler declines CABG, multivessel PCI could be considered before discharge (this would likely require atherectomy of the LAD and ostial LCx). 2. Obtain echocardiogram. 3. Aggressive secondary prevention.  Nelva Bush, MD [facility]  Diagnostic Dominance: Right   Cath: 03/31/20   Ost Cx to Prox Cx lesion is 90% stenosed.  Mid Cx lesion is 95% stenosed.  Mid RCA lesion is 80% stenosed.  A drug-eluting stent was successfully placed using a STENT RESOLUTE ONYX 3.5X15, postdilated to 3.9 mm and optimized with IVUS.  Post intervention, there is a 0% residual stenosis.  Prox LAD to Mid LAD lesion is 90% stenosed.  A drug-eluting stent was successfully placed using a [place] [id number], postdilated to 3.3 mm and optimized with IVUS.  Post intervention, there is a 0% residual stenosis.  LV end diastolic pressure is normal.  There is mild aortic valve stenosis.     Continue dual antiplatelet therapy along with aggressive secondary prevention.  Consider clopidogrel monotherapy after DAPT.    Medical management of complex circumflex disease.   Diagnostic Dominance: Right  Intervention    Echo: 03/30/20  IMPRESSIONS    1. Left ventricular ejection fraction, by estimation, is 55 to 60%. The  left ventricle has normal function. The left ventricle has no regional  wall motion abnormalities. There is severe concentric left ventricular  hypertrophy. Left ventricular diastolic  parameters are consistent with Grade II diastolic dysfunction  (pseudonormalization).  2. Right ventricular systolic function is normal. The right ventricular  size is normal. There is mildly elevated pulmonary artery systolic  pressure.  3. Left atrial size was moderately dilated.  4. Right atrial size was mildly dilated.  5. Eccentric MR with horizontal splay; likely underestimation of  regurgitation. The mitral valve is grossly normal. Mild mitral valve  regurgitation.  6. The aortic valve is tricuspid. There is moderate calcification of the  aortic valve. Aortic valve regurgitation is not visualized. Mild aortic  valve sclerosis is present, with no evidence of aortic valve stenosis.  Aortic valve area, by VTI measures  1.78 cm. Aortic valve mean gradient measures 8.3 mmHg. Aortic valve Vmax  measures 1.91 m/s.  7. There is borderline dilatation of the ascending aorta, measuring 39  mm.  8. The inferior vena cava is normal in size with greater than 50%  respiratory variability, suggesting right atrial pressure of 3 mmHg.   Comparison(s): No prior Echocardiogram.  _____________   History of Present Illness     Gary Riggs is a 86 y.o. male with with a hx of hypertension,  hypothyroidism s/p radioactive iodine for Graves disease, BPH who was seen as a new consult by Dr. Harrell Gave at the request of Josetta Huddle, MD for the evaluation and  management of abnormal stress test.  Note reviewed from Dr. Inda Merlin from 03/02/20. Noted syncope the day prior in church. Also noted chest tightness the week prior at the [employer] football game. Referred for stress test.  Dr. Harrell Gave spoke to Mr. Siemon after his stress test 10/13. Stress test was significantly abnormal. He wanted to discuss with Dr. Inda Merlin before committing to next steps for evaluation in cardiology. He has had no symptoms or issues since we spoke.  Has been generally healthy, taking a lot of supplements to stay healthy. For his heart, takes CoQ10 and resveratrol. Has only ever been in the hospital as child for having his tonsils out.   Denied any chest pain, shortness of breath at rest or with normal exertion. No PND, orthopnea, LE edema or unexpected weight gain. Had not had syncope since his event at church.   With abnormal stress testing he was set up for outpatient cardiac cath.   Hospital Course     Consultants: [facility]  Underwent cardiac cath noted above on 10/25 with Dr. Saunders Revel noting 3v CAD. He was referred to [facility] for consideration of CABG. Seen by Dr. Orvan Seen but adamantly denied CABG wanting less invasive treatment. He was then loaded with plavix and set up for high risk PCI. Observed overnight and brought back for intervention the following morning with Dr. Irish Lack. Successful PCI/DESx1 to mRCA, and DESx1 to p/mLAD using IVUS. Plan for DAPT with ASA/plavix for at least one year. Plan to treat Lcx disease medically. No chest pain overnight. Discussed with him regarding switching from Zocor to high intensity statin but he wants to remain on Zocor at this time. Blood pressures were stable overnight. No room to add BB therapy at this time. Worked well with cardiac rehab without recurrent chest pain.   General: Well developed, well nourished, male appearing in no acute distress. Head: Normocephalic, atraumatic.  Neck: Supple without bruits, JVD. Lungs:  Resp regular and  unlabored, CTA. Heart: RRR, S1, S2, + systolic murmur; no rub. Abdomen: Soft, non-tender, non-distended with normoactive bowel sounds. No hepatomegaly. No rebound/guarding. No obvious abdominal masses. Extremities: No clubbing, cyanosis, edema. Distal pedal pulses are 2+ bilaterally. Right radial cath site stable without bruising or hematoma Neuro: Alert and oriented X 3. Moves all extremities spontaneously. Psych: Normal affect.   Did the patient have an acute coronary syndrome (MI, NSTEMI, STEMI, etc) this admission?:  No                               Did the patient have a percutaneous coronary intervention (stent / angioplasty)?:  Yes.    Cath/PCI Registry Performance & Quality Measures: 1. Aspirin prescribed? - Yes 2. ADP Receptor Inhibitor (Plavix/Clopidogrel, Brilinta/Ticagrelor or Effient/Prasugrel) prescribed (includes medically managed patients)? - Yes 3. High Intensity Statin (Lipitor 40-80mg  or Crestor 20-40mg ) prescribed? - No - on Zocor, prefers to remain on this.  4. For EF <40%, was ACEI/ARB prescribed? - Not Applicable (EF >/= 01%) 5. For EF <40%, Aldosterone Antagonist (Spironolactone or Eplerenone) prescribed? - Not Applicable (EF >/= 02%) 6. Cardiac Rehab Phase II ordered? - Yes   _____________  Discharge Vitals Blood pressure 130/82, pulse 66, temperature 98.1 F (36.7 C), temperature source Oral, resp. rate 18, height 6' (1.829 m), weight 83.6 kg,  SpO2 98 %.  Filed Weights   03/30/20 0555 03/31/20 0536 04/01/20 0507  Weight: 86.2 kg 84.7 kg 83.6 kg    Labs & Radiologic Studies    CBC Recent Labs    03/31/20 0138 04/01/20 0159  WBC 8.7 8.2  HGB 11.9* 11.6*  HCT 35.8* 35.1*  MCV 98.1 97.0  PLT 263 366   Basic Metabolic Panel Recent Labs    03/30/20 1459 03/31/20 0138  NA  --  134*  K  --  4.4  CL  --  99  CO2  --  24  GLUCOSE  --  101*  BUN  --  16  CREATININE 0.98 0.97  CALCIUM  --  9.0   Liver Function Tests No results for input(s): AST,  ALT, ALKPHOS, BILITOT, PROT, ALBUMIN in the last 72 hours. No results for input(s): LIPASE, AMYLASE in the last 72 hours. High Sensitivity Troponin:   No results for input(s): TROPONINIHS in the last 720 hours.  BNP Invalid input(s): POCBNP D-Dimer No results for input(s): DDIMER in the last 72 hours. Hemoglobin A1C No results for input(s): HGBA1C in the last 72 hours. Fasting Lipid Panel Recent Labs    03/31/20 0138  CHOL 165  HDL 45  LDLCALC 104*  TRIG 82  CHOLHDL 3.7   Thyroid Function Tests No results for input(s): TSH, T4TOTAL, T3FREE, THYROIDAB in the last 72 hours.  Invalid input(s): FREET3 _____________  CARDIAC CATHETERIZATION  Result Date: 03/31/2020  Ost Cx to Prox Cx lesion is 90% stenosed.  Mid Cx lesion is 95% stenosed.  Mid RCA lesion is 80% stenosed.  A drug-eluting stent was successfully placed using a STENT RESOLUTE ONYX 3.5X15, postdilated to 3.9 mm and optimized with IVUS.  Post intervention, there is a 0% residual stenosis.  Prox LAD to Mid LAD lesion is 90% stenosed.  A drug-eluting stent was successfully placed using a [place] 2.94T65, postdilated to 3.3 mm and optimized with IVUS.  Post intervention, there is a 0% residual stenosis.  LV end diastolic pressure is normal.  There is mild aortic valve stenosis.  Continue dual antiplatelet therapy along with aggressive secondary prevention.  Consider clopidogrel monotherapy after DAPT.  Medical management of complex circumflex disease.   CARDIAC CATHETERIZATION  Result Date: 03/30/2020 Conclusions: 1. Severe three-vessel coronary artery disease, including 80-95% proximal/mid LAD, ostial and mid LCx, and mid RCA stenoses.  The proximal/mid LAD and ostial LCx lesions are heavily calcified. 2. Normal left ventricular systolic function and filling pressure. 3. Possible mild aortic stenosis. Recommendations: 1. Given unexplained syncope and recent high-risk stress test, we will admit Mr. Gutridge for  cardiac surgery consultation for CABG.  If he is not a reasonable candidate for surgery or Mr. Dorvil declines CABG, multivessel PCI could be considered before discharge (this would likely require atherectomy of the LAD and ostial LCx). 2. Obtain echocardiogram. 3. Aggressive secondary prevention. Nelva Bush, MD [facility]  ECHOCARDIOGRAM COMPLETE  Result Date: 03/30/2020    ECHOCARDIOGRAM REPORT   Patient Name:   FINAS DELONE Date of Exam: 03/30/2020 Medical Rec #:  [id number]        Height:       72.0 in Accession #:    [id number]       Weight:       190.0 lb Date of Birth:  [birth year]        BSA:          2.085 m Patient Age:    37 years  BP:           142/73 mmHg Patient Gender: M                HR:           64 bpm. Exam Location:  Inpatient Procedure: 2D Echo, Cardiac Doppler and Color Doppler Indications:    Syncope  History:        Patient has no prior history of Echocardiogram examinations.                 Risk Factors:Hypertension and Dyslipidemia.  Sonographer:    Clayton Lefort RDCS (AE) Referring Phys: [phone] CHRISTOPHER END IMPRESSIONS  1. Left ventricular ejection fraction, by estimation, is 55 to 60%. The left ventricle has normal function. The left ventricle has no regional wall motion abnormalities. There is severe concentric left ventricular hypertrophy. Left ventricular diastolic  parameters are consistent with Grade II diastolic dysfunction (pseudonormalization).  2. Right ventricular systolic function is normal. The right ventricular size is normal. There is mildly elevated pulmonary artery systolic pressure.  3. Left atrial size was moderately dilated.  4. Right atrial size was mildly dilated.  5. Eccentric MR with horizontal splay; likely underestimation of regurgitation. The mitral valve is grossly normal. Mild mitral valve regurgitation.  6. The aortic valve is tricuspid. There is moderate calcification of the aortic valve. Aortic valve regurgitation is not visualized.  Mild aortic valve sclerosis is present, with no evidence of aortic valve stenosis. Aortic valve area, by VTI measures 1.78 cm. Aortic valve mean gradient measures 8.3 mmHg. Aortic valve Vmax measures 1.91 m/s.  7. There is borderline dilatation of the ascending aorta, measuring 39 mm.  8. The inferior vena cava is normal in size with greater than 50% respiratory variability, suggesting right atrial pressure of 3 mmHg. Comparison(s): No prior Echocardiogram. FINDINGS  Left Ventricle: LVMI 154 g/m2; RWT 0.6. Left ventricular ejection fraction, by estimation, is 55 to 60%. The left ventricle has normal function. The left ventricle has no regional wall motion abnormalities. The left ventricular internal cavity size was normal in size. There is severe concentric left ventricular hypertrophy. Left ventricular diastolic parameters are consistent with Grade II diastolic dysfunction (pseudonormalization). Right Ventricle: The right ventricular size is normal. No increase in right ventricular wall thickness. Right ventricular systolic function is normal. There is mildly elevated pulmonary artery systolic pressure. The tricuspid regurgitant velocity is 2.92  m/s, and with an assumed right atrial pressure of 3 mmHg, the estimated right ventricular systolic pressure is 40.8 mmHg. Left Atrium: Left atrial size was moderately dilated. Right Atrium: Right atrial size was mildly dilated. Pericardium: There is no evidence of pericardial effusion. Mitral Valve: Eccentric MR with horizontal splay; likely underestimation of regurgitation. The mitral valve is grossly normal. There is moderate thickening of the mitral valve leaflet(s). Mild mitral valve regurgitation. MV peak gradient, 4.5 mmHg. The mean mitral valve gradient is 2.0 mmHg. Tricuspid Valve: The tricuspid valve is grossly normal. Tricuspid valve regurgitation is mild. Aortic Valve: The aortic valve is tricuspid. There is moderate calcification of the aortic valve. Aortic  valve regurgitation is not visualized. Mild aortic valve sclerosis is present, with no evidence of aortic valve stenosis. Aortic valve mean gradient measures 8.3 mmHg. Aortic valve peak gradient measures 14.6 mmHg. Aortic valve area, by VTI measures 1.78 cm. Pulmonic Valve: The pulmonic valve was not well visualized. Pulmonic valve regurgitation is not visualized. Aorta: The aortic root is normal in size and structure. There is borderline dilatation  of the ascending aorta, measuring 39 mm. Venous: A systolic blunting flow pattern is recorded from the right lower pulmonary vein. The inferior vena cava is normal in size with greater than 50% respiratory variability, suggesting right atrial pressure of 3 mmHg. IAS/Shunts: The atrial septum is grossly normal.  LEFT VENTRICLE PLAX 2D LVIDd:         5.00 cm  Diastology LVIDs:         3.30 cm  LV e' medial:    5.00 cm/s LV PW:         1.50 cm  LV E/e' medial:  14.2 LV IVS:        1.50 cm  LV e' lateral:   7.29 cm/s LVOT diam:     2.40 cm  LV E/e' lateral: 9.7 LV SV:         81 LV SV Index:   39 LVOT Area:     4.52 cm  RIGHT VENTRICLE             IVC RV Basal diam:  3.10 cm     IVC diam: 1.00 cm RV S prime:     11.30 cm/s TAPSE (M-mode): 2.5 cm LEFT ATRIUM             Index       RIGHT ATRIUM           Index LA diam:        4.50 cm 2.16 cm/m  RA Area:     21.20 cm LA Vol (A2C):   95.8 ml 45.96 ml/m RA Volume:   59.00 ml  28.30 ml/m LA Vol (A4C):   71.1 ml 34.11 ml/m LA Biplane Vol: 83.2 ml 39.91 ml/m  AORTIC VALVE AV Area (Vmax):    1.85 cm AV Area (Vmean):   1.75 cm AV Area (VTI):     1.78 cm AV Vmax:           191.33 cm/s AV Vmean:          134.000 cm/s AV VTI:            0.452 m AV Peak Grad:      14.6 mmHg AV Mean Grad:      8.3 mmHg LVOT Vmax:         78.30 cm/s LVOT Vmean:        51.700 cm/s LVOT VTI:          0.178 m LVOT/AV VTI ratio: 0.39  AORTA Ao Root diam: 3.60 cm Ao Asc diam:  3.90 cm MITRAL VALVE                TRICUSPID VALVE MV Area (PHT): 2.76  cm     TR Peak grad:   34.1 mmHg MV Peak grad:  4.5 mmHg     TR Vmax:        292.00 cm/s MV Mean grad:  2.0 mmHg MV Vmax:       1.06 m/s     SHUNTS MV Vmean:      59.5 cm/s    Systemic VTI:  0.18 m MV Decel Time: 275 msec     Systemic Diam: 2.40 cm MV E velocity: 71.00 cm/s MV A velocity: 102.00 cm/s MV E/A ratio:  0.70 Rudean Haskell MD Electronically signed by Rudean Haskell MD Signature Date/Time: 03/30/2020/7:34:34 PM    Final    Exercise Tolerance Test  Result Date: 03/18/2020  Poor exercise capacity, achieved 4.6 METS  Horizontal ST segment depression was  noted during stress in the III, aVF, II, V4, V5 and V6 leads.  EKG changes concerning for ischemia. Recommend referral to cardiology for further evaluation    Disposition   Pt is being discharged home today in good condition.  Follow-up Plans & Appointments     Follow-up Information    Buford Dresser, MD Follow up on 04/08/2020.   Specialty: Cardiology Why: at 4:20pm for your follow up appt.  Contact information: [address] [place] [address] [phone]              Discharge Instructions    AMB Referral to Cardiac Rehabilitation - Phase II   Complete by: As directed    Diagnosis: Coronary Stents   After initial evaluation and assessments completed: Virtual Based Care may be provided alone or in conjunction with Phase 2 Cardiac Rehab based on patient barriers.: Yes   Call MD for:  redness, tenderness, or signs of infection (pain, swelling, redness, odor or green/yellow discharge around incision site)   Complete by: As directed    Diet - low sodium heart healthy   Complete by: As directed    Discharge instructions   Complete by: As directed    Radial Site Care Refer to this sheet in the next few weeks. These instructions provide you with information on caring for yourself after your procedure. Your caregiver may also give you more specific instructions. Your treatment has been  planned according to current medical practices, but problems sometimes occur. Call your caregiver if you have any problems or questions after your procedure. HOME CARE INSTRUCTIONS You may shower the day after the procedure.Remove the bandage (dressing) and gently wash the site with plain soap and water.Gently pat the site dry.  Do not apply powder or lotion to the site.  Do not submerge the affected site in water for 3 to 5 days.  Inspect the site at least twice daily.  Do not flex or bend the affected arm for 24 hours.  No lifting over 5 pounds (2.3 kg) for 5 days after your procedure.  Do not drive home if you are discharged the same day of the procedure. Have someone else drive you.  You may drive 24 hours after the procedure unless otherwise instructed by your caregiver.  What to expect: Any bruising will usually fade within 1 to 2 weeks.  Blood that collects in the tissue (hematoma) may be painful to the touch. It should usually decrease in size and tenderness within 1 to 2 weeks.  SEEK IMMEDIATE MEDICAL CARE IF: You have unusual pain at the radial site.  You have redness, warmth, swelling, or pain at the radial site.  You have drainage (other than a small amount of blood on the dressing).  You have chills.  You have a fever or persistent symptoms for more than 72 hours.  You have a fever and your symptoms suddenly get worse.  Your arm becomes pale, cool, tingly, or numb.  You have heavy bleeding from the site. Hold pressure on the site.   PLEASE DO NOT MISS ANY DOSES OF YOUR PLAVIX!!!!! Also keep a log of you blood pressures and bring back to your follow up appt. Please call the office with any questions.   Patients taking blood thinners should generally stay away from medicines like ibuprofen, Advil, Motrin, naproxen, and Aleve due to risk of stomach bleeding. You may take Tylenol as directed or talk to your primary doctor about alternatives.   Increase activity  slowly   Complete  by: As directed       Discharge Medications   Allergies as of 04/01/2020      Reactions   Aleve [naproxen Sodium] Other (See Comments)   angioedema   Tussionex Pennkinetic Er [hydrocod Polst-cpm Polst Er] Swelling, Other (See Comments)   Face swelling   Synthroid [levothyroxine Sodium] Other (See Comments)   Not tolerated name brand      Medication List    TAKE these medications   aspirin 81 MG chewable tablet Chew 81 mg by mouth daily.   CALCIUM CITRATE-MAG-MINERALS PO Take 1 tablet by mouth daily. Vit D3/ Zinc   clopidogrel 75 MG tablet Commonly known as: PLAVIX Take 1 tablet (75 mg total) by mouth daily with breakfast.   GLUCOS-CHONDROIT-MSM-C-HYAL PO Take 1 tablet by mouth daily. 1500/1200   levothyroxine 137 MCG tablet Commonly known as: SYNTHROID Take 137 mcg by mouth daily before breakfast.   losartan-hydrochlorothiazide 100-12.5 MG tablet Commonly known as: HYZAAR Take 1 tablet by mouth daily.   multivitamin capsule Take 1 capsule by mouth daily.   nitroGLYCERIN 0.4 MG SL tablet Commonly known as: NITROSTAT Place 0.4 mg under the tongue every 5 (five) minutes as needed for chest pain.   OVER THE COUNTER MEDICATION Take 1 capsule by mouth daily. [employer]   RESVERATROL PO Take 100 mg by mouth daily. Omega Q plus   simvastatin 40 MG tablet Commonly known as: ZOCOR Take 40 mg by mouth daily.   tamsulosin 0.4 MG Caps capsule Commonly known as: FLOMAX Take 0.4 mg by mouth at bedtime.   VITAMIN B-12 PO Take 2,500 mcg by mouth daily.   vitamin C 1000 MG tablet Take 1,000 mg by mouth daily.   Vitamin D-3 25 MCG (1000 UT) Caps Take 1,000 Units by mouth daily.       Outstanding Labs/Studies   N/a   Duration of Discharge Encounter   Greater than 30 minutes including physician time.  Signed, Reino Bellis, NP 04/01/2020, 9:41 AM

## 2020-03-31 NOTE — Interval H&P Note (Signed)
Cath Lab Visit (complete for each Cath Lab visit)  Clinical Evaluation Leading to the Procedure:   ACS: No.  Non-ACS:    Anginal Classification: CCS III  Anti-ischemic medical therapy: Minimal Therapy (1 class of medications)  Non-Invasive Test Results: No non-invasive testing performed  Prior CABG: No previous CABG  Patient was offered CABG but declined.  Will plan to fix RCA and LAD today.      History and Physical Interval Note:  03/31/2020 9:15 AM  Gary Riggs  has presented today for surgery, with the diagnosis of cad.  The various methods of treatment have been discussed with the patient and family. After consideration of risks, benefits and other options for treatment, the patient has consented to  Procedure(s): CORONARY STENT INTERVENTION (N/A) as a surgical intervention.  The patient's history has been reviewed, patient examined, no change in status, stable for surgery.  I have reviewed the patient's chart and labs.  Questions were answered to the patient's satisfaction.     Larae Grooms

## 2020-04-01 DIAGNOSIS — R9439 Abnormal result of other cardiovascular function study: Secondary | ICD-10-CM

## 2020-04-01 DIAGNOSIS — I25110 Atherosclerotic heart disease of native coronary artery with unstable angina pectoris: Secondary | ICD-10-CM | POA: Diagnosis not present

## 2020-04-01 LAB — CBC
HCT: 35.1 % — ABNORMAL LOW (ref 39.0–52.0)
Hemoglobin: 11.6 g/dL — ABNORMAL LOW (ref 13.0–17.0)
MCH: 32 pg (ref 26.0–34.0)
MCHC: 33 g/dL (ref 30.0–36.0)
MCV: 97 fL (ref 80.0–100.0)
Platelets: 221 10*3/uL (ref 150–400)
RBC: 3.62 MIL/uL — ABNORMAL LOW (ref 4.22–5.81)
RDW: 12.3 % (ref 11.5–15.5)
WBC: 8.2 10*3/uL (ref 4.0–10.5)
nRBC: 0 % (ref 0.0–0.2)

## 2020-04-01 MED ORDER — CLOPIDOGREL BISULFATE 75 MG PO TABS
75.0000 mg | ORAL_TABLET | Freq: Every day | ORAL | 2 refills | Status: DC
Start: 2020-04-01 — End: 2020-12-11

## 2020-04-01 NOTE — Discharge Instructions (Addendum)
Radial Site Care  This sheet gives you information about how to care for yourself after your procedure. Your health care provider may also give you more specific instructions. If you have problems or questions, contact your health care provider. What can I expect after the procedure? After the procedure, it is common to have:  Bruising and tenderness at the catheter insertion area. Follow these instructions at home: Medicines  Take over-the-counter and prescription medicines only as told by your health care provider. Insertion site care  Follow instructions from your health care provider about how to take care of your insertion site. Make sure you: ? Wash your hands with soap and water before you change your bandage (dressing). If soap and water are not available, use hand sanitizer. ? Change your dressing as told by your health care provider. ? Leave stitches (sutures), skin glue, or adhesive strips in place. These skin closures may need to stay in place for 2 weeks or longer. If adhesive strip edges start to loosen and curl up, you may trim the loose edges. Gary Riggs not remove adhesive strips completely unless your health care provider tells you to Gary Riggs that.  Check your insertion site every day for signs of infection. Check for: ? Redness, swelling, or pain. ? Fluid or blood. ? Pus or a bad smell. ? Warmth.  Gary Riggs not take baths, swim, or use a hot tub until your health care provider approves.  You may shower 24-48 hours after the procedure, or as directed by your health care provider. ? Remove the dressing and gently wash the site with plain soap and water. ? Pat the area dry with a clean towel. ? Gary Riggs not rub the site. That could cause bleeding.  Gary Riggs not apply powder or lotion to the site. Activity   For 24 hours after the procedure, or as directed by your health care provider: ? Gary Riggs not flex or bend the affected arm. ? Gary Riggs not push or pull heavy objects with the affected arm. ? Gary Riggs not  drive yourself home from the hospital or clinic. You may drive 24 hours after the procedure unless your health care provider tells you not to. ? Gary Riggs not operate machinery or power tools.  Gary Riggs not lift anything that is heavier than 10 lb (4.5 kg), or the limit that you are told, until your health care provider says that it is safe.  Ask your health care provider when it is okay to: ? Return to work or school. ? Resume usual physical activities or sports. ? Resume sexual activity. General instructions  If the catheter site starts to bleed, raise your arm and put firm pressure on the site. If the bleeding does not stop, get help right away. This is a medical emergency.  If you went home on the same day as your procedure, a responsible adult should be with you for the first 24 hours after you arrive home.  Keep all follow-up visits as told by your health care provider. This is important. Contact a health care provider if:  You have a fever.  You have redness, swelling, or yellow drainage around your insertion site. Get help right away if:  You have unusual pain at the radial site.  The catheter insertion area swells very fast.  The insertion area is bleeding, and the bleeding does not stop when you hold steady pressure on the area.  Your arm or hand becomes pale, cool, tingly, or numb. These symptoms may represent a serious problem   that is an emergency. Gary Riggs not wait to see if the symptoms will go away. Get medical help right away. Call your local emergency services (911 in the [place] Gary Riggs not drive yourself to the hospital. Summary  After the procedure, it is common to have bruising and tenderness at the site.  Follow instructions from your health care provider about how to take care of your radial site wound. Check the wound every day for signs of infection.  Gary Riggs not lift anything that is heavier than 10 lb (4.5 kg), or the limit that you are told, until your health care provider says  that it is safe. This information is not intended to replace advice given to you by your health care provider. Make sure you discuss any questions you have with your health care provider. Document Revised: 06/28/2017 Document Reviewed: 06/28/2017 Elsevier Patient Education  2020 [place] about your medication: Plavix (anti-platelet agent)  Generic Name (Brand): clopidogrel (Plavix), once daily medication  PURPOSE: You are taking this medication along with aspirin to lower your chance of having a heart attack, stroke, or blood clots in your heart stent. These can be fatal. Brilinta and aspirin help prevent platelets from sticking together and forming a clot that can block an artery or your stent.   Common SIDE EFFECTS you may experience include: bruising or bleeding more easily, shortness of breath  Gary Riggs not stop taking PLAVIX without talking to the doctor who prescribes it for you. People who are treated with a stent and stop taking Plavix too soon, have a higher risk of getting a blood clot in the stent, having a heart attack, or dying. If you stop Plavix because of bleeding, or for other reasons, your risk of a heart attack or stroke may increase.   Tell all of your doctors and dentists that you are taking Plavix. They should talk to the doctor who prescribed Brilinta for you before you have any surgery or invasive procedure.   Contact your health care provider if you experience: severe or uncontrollable bleeding, pink/red/brown urine, vomiting blood or vomit that looks like "coffee grounds", red or black stools (looks like tar), coughing up blood or blood clots ----------------------------------------------------------------------------------------------------------------------

## 2020-04-01 NOTE — Progress Notes (Signed)
CARDIAC REHAB PHASE I   PRE:  Rate/Rhythm: 67 SB  BP:  Sitting: 129/70      SaO2: 95 RA  MODE:  Ambulation: 350 ft   POST:  Rate/Rhythm: 67 SR  BP:  Sitting: 157/75    SaO2: 97 RA   Pt ambulated 35ft in hallway handheld assist with slow steady gait. Pt denies dizziness, pain, or SOB. Pt and daughter educated on importance of ASA and Plavix. Pt given heart healthy diet. Reviewed site care, restrictions, and exercise guidelines. Will refer to CRP II [place].  [id number] Rufina Falco, RN BSN 04/01/2020 10:20 AM

## 2020-04-01 NOTE — Progress Notes (Signed)
Pt's stable, dc home via wheelchair

## 2020-04-08 ENCOUNTER — Encounter: Payer: Self-pay | Admitting: Cardiology

## 2020-04-08 ENCOUNTER — Ambulatory Visit: Payer: Medicare HMO | Admitting: Cardiology

## 2020-04-08 ENCOUNTER — Other Ambulatory Visit: Payer: Self-pay

## 2020-04-08 VITALS — BP 130/70 | HR 62 | Ht 72.0 in | Wt 191.8 lb

## 2020-04-08 DIAGNOSIS — R55 Syncope and collapse: Secondary | ICD-10-CM | POA: Diagnosis not present

## 2020-04-08 DIAGNOSIS — Z955 Presence of coronary angioplasty implant and graft: Secondary | ICD-10-CM

## 2020-04-08 DIAGNOSIS — E785 Hyperlipidemia, unspecified: Secondary | ICD-10-CM | POA: Diagnosis not present

## 2020-04-08 DIAGNOSIS — I2510 Atherosclerotic heart disease of native coronary artery without angina pectoris: Secondary | ICD-10-CM

## 2020-04-08 DIAGNOSIS — I10 Essential (primary) hypertension: Secondary | ICD-10-CM | POA: Diagnosis not present

## 2020-04-08 DIAGNOSIS — N400 Enlarged prostate without lower urinary tract symptoms: Secondary | ICD-10-CM | POA: Diagnosis not present

## 2020-04-08 DIAGNOSIS — E7800 Pure hypercholesterolemia, unspecified: Secondary | ICD-10-CM | POA: Diagnosis not present

## 2020-04-08 DIAGNOSIS — R079 Chest pain, unspecified: Secondary | ICD-10-CM | POA: Diagnosis not present

## 2020-04-08 DIAGNOSIS — R053 Chronic cough: Secondary | ICD-10-CM | POA: Diagnosis not present

## 2020-04-08 DIAGNOSIS — I700 Atherosclerosis of aorta: Secondary | ICD-10-CM | POA: Diagnosis not present

## 2020-04-08 DIAGNOSIS — Z7189 Other specified counseling: Secondary | ICD-10-CM | POA: Diagnosis not present

## 2020-04-08 MED ORDER — ROSUVASTATIN CALCIUM 20 MG PO TABS: 20.0000 mg | ORAL_TABLET | Freq: Every day | ORAL | 3 refills | Status: DC

## 2020-04-08 NOTE — Progress Notes (Signed)
Cardiology Office Note:    Date:  04/08/2020   ID:  Gary Riggs, DOB 1931-02-02, MRN [id number]  PCP:  Josetta Huddle, MD  Cardiologist:  Buford Dresser, MD  Referring MD: Josetta Huddle, MD   CC: post hospital follow up  History of Present Illness:    Gary Riggs is a 86 y.o. male with a hx of hypertension, hypothyroidism s/p radioactive iodine for Graves disease, BPH who is seen for follow up today. I initially saw him 03/24/20 as a new consult at the request of Josetta Huddle, MD for the evaluation and management of abnormal stress test.  Cardiac history: Noted syncope 03/01/20 in church. Also noted chest tightness the week prior at the [facility] football game, as well as a pre-syncopal event while showering. Referred for stress test. I spoke to Gary Riggs after his stress test 10/13. Stress test was significantly abnormal. He wanted to discuss with Dr. Inda Merlin before committing to next steps for evaluation in cardiology. He has had no symptoms or issues since we spoke.  Today: Discharged from hospital 04/01/20 after cath and PCI. Doing very well. No chest pain. Reviewed medications and management. All questions answered.  Denies chest pain, shortness of breath at rest or with normal exertion. No PND, orthopnea, LE edema or unexpected weight gain. No syncope or palpitations.  Past Medical History:  Diagnosis Date  . Coronary artery disease   . Diverticulosis   . DJD (degenerative joint disease)   . ED (erectile dysfunction)   . Hemorrhoids   . Hiatal hernia   . Hypertension   . Onychomycosis   . Rosacea   . Thyroid disease     Past Surgical History:  Procedure Laterality Date  . CORONARY STENT INTERVENTION N/A 03/31/2020   Procedure: CORONARY STENT INTERVENTION;  Surgeon: Jettie Booze, MD;  Location: [place] [facility];  Service: Cardiovascular;  Laterality: N/A;  . INTRAVASCULAR ULTRASOUND/IVUS N/A 03/31/2020   Procedure: Intravascular Ultrasound/IVUS;   Surgeon: Jettie Booze, MD;  Location: [place] [facility];  Service: Cardiovascular;  Laterality: N/A;  . LEFT HEART CATH N/A 03/31/2020   Procedure: Left Heart Cath;  Surgeon: Jettie Booze, MD;  Location: [place] [facility];  Service: Cardiovascular;  Laterality: N/A;  . LEFT HEART CATH AND CORONARY ANGIOGRAPHY N/A 03/30/2020   Procedure: LEFT HEART CATH AND CORONARY ANGIOGRAPHY;  Surgeon: Nelva Bush, MD;  Location: [place] [facility];  Service: Cardiovascular;  Laterality: N/A;    Current Medications: Current Outpatient Medications on File Prior to Visit  Medication Sig  . Ascorbic Acid (VITAMIN C) 1000 MG tablet Take 1,000 mg by mouth daily.  Marland Kitchen aspirin 81 MG chewable tablet Chew 81 mg by mouth daily.   . Cholecalciferol (VITAMIN D-3) 1000 UNITS CAPS Take 1,000 Units by mouth daily.   . clopidogrel (PLAVIX) 75 MG tablet Take 1 tablet (75 mg total) by mouth daily with breakfast.  . Cyanocobalamin (VITAMIN B-12 PO) Take 2,500 mcg by mouth daily.   Marland Kitchen GLUCOS-CHONDROIT-MSM-C-HYAL PO Take 1 tablet by mouth daily. 1500/1200  . levothyroxine (SYNTHROID) 137 MCG tablet Take 137 mcg by mouth daily before breakfast.   . losartan-hydrochlorothiazide (HYZAAR) 100-12.5 MG per tablet Take 1 tablet by mouth daily.  . Multiple Minerals-Vitamins (CALCIUM CITRATE-MAG-MINERALS PO) Take 1 tablet by mouth daily. Vit D3/ Zinc  . Multiple Vitamin (MULTIVITAMIN) capsule Take 1 capsule by mouth daily.  . nitroGLYCERIN (NITROSTAT) 0.4 MG SL tablet Place 0.4 mg under the tongue every 5 (five) minutes  as needed for chest pain.  Marland Kitchen OVER THE COUNTER MEDICATION Take 1 capsule by mouth daily. Brain Logic  . RESVERATROL PO Take 100 mg by mouth daily. Omega Q plus  . simvastatin (ZOCOR) 40 MG tablet Take 40 mg by mouth daily.  . tamsulosin (FLOMAX) 0.4 MG CAPS capsule Take 0.4 mg by mouth at bedtime.    No current facility-administered medications on file prior to visit.     Allergies:   Aleve  [naproxen sodium], Tussionex pennkinetic er [hydrocod polst-cpm polst er], and Synthroid [levothyroxine sodium]   Social History   Tobacco Use  . Smoking status: Never Smoker  . Smokeless tobacco: Never Used  Vaping Use  . Vaping Use: Never used  Substance Use Topics  . Alcohol use: No  . Drug use: No    Family History: Father had Alzheimer's, mother deceased of unknown causes. Sister with diabetes.  ROS:   Please see the history of present illness.  Additional pertinent ROS otherwise unremarkable.  EKGs/Labs/Other Studies Reviewed:    The following studies were reviewed today: ETT 2020/03/27  Poor exercise capacity, achieved 4.6 METS  Horizontal ST segment depression was noted during stress in the III, aVF, II, V4, V5 and V6 leads.  EKG changes concerning for ischemia. Recommend referral to cardiology for further evaluation  I personally reviewed the strips as DOD that day; they are not all saved in the system. Had significant ventricular ectopy, including PVC triplet.  Cath 03/30/20 Conclusions: 1. Severe three-vessel coronary artery disease, including 80-95% proximal/mid LAD, ostial and mid LCx, and mid RCA stenoses.  The proximal/mid LAD and ostial LCx lesions are heavily calcified. 2. Normal left ventricular systolic function and filling pressure. 3. Possible mild aortic stenosis.  Recommendations: 1. Given unexplained syncope and recent high-risk stress test, we will admit Gary Riggs for cardiac surgery consultation for CABG.  If he is not a reasonable candidate for surgery or Gary Riggs declines CABG, multivessel PCI could be considered before discharge (this would likely require atherectomy of the LAD and ostial LCx). 2. Obtain echocardiogram. 3. Aggressive secondary prevention.  Echo 03/30/20 1. Left ventricular ejection fraction, by estimation, is 55 to 60%. The  left ventricle has normal function. The left ventricle has no regional  wall motion abnormalities.  There is severe concentric left ventricular  hypertrophy. Left ventricular diastolic  parameters are consistent with Grade II diastolic dysfunction  (pseudonormalization).  2. Right ventricular systolic function is normal. The right ventricular  size is normal. There is mildly elevated pulmonary artery systolic  pressure.  3. Left atrial size was moderately dilated.  4. Right atrial size was mildly dilated.  5. Eccentric MR with horizontal splay; likely underestimation of  regurgitation. The mitral valve is grossly normal. Mild mitral valve  regurgitation.  6. The aortic valve is tricuspid. There is moderate calcification of the  aortic valve. Aortic valve regurgitation is not visualized. Mild aortic  valve sclerosis is present, with no evidence of aortic valve stenosis.  Aortic valve area, by VTI measures  1.78 cm. Aortic valve mean gradient measures 8.3 mmHg. Aortic valve Vmax  measures 1.91 m/s.  7. There is borderline dilatation of the ascending aorta, measuring 39  mm.  8. The inferior vena cava is normal in size with greater than 50%  respiratory variability, suggesting right atrial pressure of 3 mmHg.   PCI 03/31/20  Ost Cx to Prox Cx lesion is 90% stenosed.  Mid Cx lesion is 95% stenosed.  Mid RCA lesion is 80%  stenosed.  A drug-eluting stent was successfully placed using a STENT RESOLUTE ONYX 3.5X15, postdilated to 3.9 mm and optimized with IVUS.  Post intervention, there is a 0% residual stenosis.  Prox LAD to Mid LAD lesion is 90% stenosed.  A drug-eluting stent was successfully placed using a [place] [id number], postdilated to 3.3 mm and optimized with IVUS.  Post intervention, there is a 0% residual stenosis.  LV end diastolic pressure is normal.  There is mild aortic valve stenosis.   Continue dual antiplatelet therapy along with aggressive secondary prevention.  Consider clopidogrel monotherapy after DAPT.    Medical management of  complex circumflex disease.   EKG:  EKG is personally reviewed.  The ekg ordered 04/01/20 demonstrates sinus rhythm with 1st degree AV block at 64 bpm  Recent Labs: 03/31/2020: BUN 16; Creatinine, Ser 0.97; Potassium 4.4; Sodium 134 04/01/2020: Hemoglobin 11.6; Platelets 221  Recent Lipid Panel    Component Value Date/Time   CHOL 165 03/31/2020 0138   TRIG 82 03/31/2020 0138   HDL 45 03/31/2020 0138   CHOLHDL 3.7 03/31/2020 0138   VLDL 16 03/31/2020 0138   LDLCALC 104 (H) 03/31/2020 0138    Physical Exam:    VS:  BP 130/70   Pulse 62   Ht 6' (1.829 m)   Wt 191 lb 12.8 oz (87 kg)   SpO2 98%   BMI 26.01 kg/m     Wt Readings from Last 3 Encounters:  04/08/20 191 lb 12.8 oz (87 kg)  04/01/20 184 lb 4.8 oz (83.6 kg)  03/24/20 193 lb (87.5 kg)    GEN: Well nourished, well developed in no acute distress HEENT: Normal, moist mucous membranes NECK: No JVD CARDIAC: regular rhythm, normal S1 and S2, no rubs or gallops. 1/6 systolic ejection murmur. VASCULAR: Radial and DP pulses 2+ bilaterally. No carotid bruits RESPIRATORY:  Clear to auscultation without rales, wheezing or rhonchi  ABDOMEN: Soft, non-tender, non-distended MUSCULOSKELETAL:  Ambulates independently SKIN: Warm and dry, no edema NEUROLOGIC:  Alert and oriented x 3. No focal neuro deficits noted. PSYCHIATRIC:  Normal affect   ASSESSMENT:    1. Coronary artery disease involving native coronary artery of native heart without angina pectoris   2. Hypercholesterolemia   3. Essential hypertension   4. Cardiac risk counseling   5. Counseling on health promotion and disease prevention   6. History of coronary angioplasty with insertion of stent    PLAN:    Obstructive CAD, with prior symptoms of syncope and exertional chest pain -cath as above, declined CABG, proceeded with PCI -has done well since that time -echo with preserved EF, mild AS, eccentric MR -continue aspirin 81 mg daily, clopidogrel 75 mg  daily -continue rosuvastatin 20 mg daily -discussed use of SL NG -discussed red flag warning signs that need immediate medical attention  Hypertension: at goal today -continue losartan-HCTZ cath  Hypercholesterolemia: -now on rosuvastatin 20 mg daily -recheck labs 2 mos  Cardiac risk counseling and prevention recommendations: -recommend heart healthy/Mediterranean diet, with whole grains, fruits, vegetable, fish, lean meats, nuts, and olive oil. Limit salt. -recommend moderate walking, 3-5 times/week for 30-50 minutes each session. Aim for at least 150 minutes.week. Goal should be pace of 3 miles/hours, or walking 1.5 miles in 30 minutes -recommend avoidance of tobacco products. Avoid excess alcohol.  Plan for follow up: 2 mos, lipids/LFTs at that visit  Buford Dresser, MD, PhD [place]  [facility]    Medication Adjustments/Labs and Tests Ordered: Current medicines are  reviewed at length with the patient today.  Concerns regarding medicines are outlined above.  No orders of the defined types were placed in this encounter.  Meds ordered this encounter  Medications  . rosuvastatin (CRESTOR) 20 MG tablet    Sig: Take 1 tablet (20 mg total) by mouth daily.    Dispense:  90 tablet    Refill:  3    Replaces simvastatin    Patient Instructions  Medication Instructions:  Stop simvastatin Start rosuvastatin *If you need a refill on your cardiac medications before your next appointment, please call your pharmacy*   Lab Work: None today If you have labs (blood work) drawn today and your tests are completely normal, you will receive your results only by: Marland Kitchen MyChart Message (if you have MyChart) OR . A paper copy in the mail If you have any lab test that is abnormal or we need to change your treatment, we will call you to review the results.   Testing/Procedures: None today   Follow-Up: At [facility], you and your health needs are our priority.  As part of  our continuing mission to provide you with exceptional heart care, we have created designated Provider Care Teams.  These Care Teams include your primary Cardiologist (physician) and Advanced Practice Providers (APPs -  Physician Assistants and Nurse Practitioners) who all work together to provide you with the care you need, when you need it.  We recommend signing up for the patient portal called "MyChart".  Sign up information is provided on this After Visit Summary.  MyChart is used to connect with patients for Virtual Visits (Telemedicine).  Patients are able to view lab/test results, encounter notes, upcoming appointments, etc.  Non-urgent messages can be sent to your provider as well.   To learn more about what you can do with MyChart, go to [email or web address].    Your next appointment:   2 months  The format for your next appointment:   In Person  Provider:   Buford Dresser, MD   Other Instructions Call with any issues or concerns Mediterranean Diet A Mediterranean diet refers to food and lifestyle choices that are based on the traditions of countries located on the [place]. This way of eating has been shown to help prevent certain conditions and improve outcomes for people who have chronic diseases, like kidney disease and heart disease. What are tips for following this plan? Lifestyle  Cook and eat meals together with your family, when possible.  Drink enough fluid to keep your urine clear or pale yellow.  Be physically active every day. This includes: ? Aerobic exercise like running or swimming. ? Leisure activities like gardening, walking, or housework.  Get 7-8 hours of sleep each night.  If recommended by your health care provider, drink red wine in moderation. This means 1 glass a day for nonpregnant women and 2 glasses a day for men. A glass of wine equals 5 oz (150 mL). Reading food labels   Check the serving size of packaged foods. For foods  such as rice and pasta, the serving size refers to the amount of cooked product, not dry.  Check the total fat in packaged foods. Avoid foods that have saturated fat or trans fats.  Check the ingredients list for added sugars, such as corn syrup. Shopping  At the grocery store, buy most of your food from the areas near the walls of the store. This includes: ? Fresh fruits and vegetables (produce). ? Grains, beans, nuts,  and seeds. Some of these may be available in unpackaged forms or large amounts (in bulk). ? Fresh seafood. ? Poultry and eggs. ? Low-fat dairy products.  Buy whole ingredients instead of prepackaged foods.  Buy fresh fruits and vegetables in-season from local farmers markets.  Buy frozen fruits and vegetables in resealable bags.  If you do not have access to quality fresh seafood, buy precooked frozen shrimp or canned fish, such as tuna, salmon, or sardines.  Buy small amounts of raw or cooked vegetables, salads, or olives from the deli or salad bar at your store.  Stock your pantry so you always have certain foods on hand, such as olive oil, canned tuna, canned tomatoes, rice, pasta, and beans. Cooking  Cook foods with extra-virgin olive oil instead of using butter or other vegetable oils.  Have meat as a side dish, and have vegetables or grains as your main dish. This means having meat in small portions or adding small amounts of meat to foods like pasta or stew.  Use beans or vegetables instead of meat in common dishes like chili or lasagna.  Experiment with different cooking methods. Try roasting or broiling vegetables instead of steaming or sauteing them.  Add frozen vegetables to soups, stews, pasta, or rice.  Add nuts or seeds for added healthy fat at each meal. You can add these to yogurt, salads, or vegetable dishes.  Marinate fish or vegetables using olive oil, lemon juice, garlic, and fresh herbs. Meal planning   Plan to eat 1 vegetarian meal one  day each week. Try to work up to 2 vegetarian meals, if possible.  Eat seafood 2 or more times a week.  Have healthy snacks readily available, such as: ? Vegetable sticks with hummus. ? [place] yogurt. ? Fruit and nut trail mix.  Eat balanced meals throughout the week. This includes: ? Fruit: 2-3 servings a day ? Vegetables: 4-5 servings a day ? Low-fat dairy: 2 servings a day ? Fish, poultry, or lean meat: 1 serving a day ? Beans and legumes: 2 or more servings a week ? Nuts and seeds: 1-2 servings a day ? Whole grains: 6-8 servings a day ? Extra-virgin olive oil: 3-4 servings a day  Limit red meat and sweets to only a few servings a month What are my food choices?  Mediterranean diet ? Recommended  Grains: Whole-grain pasta. Brown rice. Bulgar wheat. Polenta. Couscous. Whole-wheat bread. [place].  Vegetables: Artichokes. Beets. Broccoli. Cabbage. Carrots. Eggplant. Green beans. Chard. Kale. Spinach. Onions. Leeks. Peas. Squash. Tomatoes. Peppers. Radishes.  Fruits: Apples. Apricots. Avocado. Berries. Bananas. Cherries. Dates. Figs. Grapes. Lemons. Melon. Oranges. Peaches. Plums. Pomegranate.  Meats and other protein foods: Beans. Almonds. Sunflower seeds. Pine nuts. Peanuts. [place]. Salmon. Scallops. Shrimp. [place]. Tilapia. Clams. Oysters. Eggs.  Dairy: Low-fat milk. Cheese. Greek yogurt.  Beverages: Water. Red wine. Herbal tea.  Fats and oils: Extra virgin olive oil. Avocado oil. Grape seed oil.  Sweets and desserts: [place] yogurt with honey. Baked apples. Poached pears. Trail mix.  Seasoning and other foods: Basil. Cilantro. Coriander. Cumin. Mint. Parsley. Sage. Rosemary. Tarragon. Garlic. Oregano. Thyme. Pepper. Balsalmic vinegar. Tahini. Hummus. Tomato sauce. Olives. Mushrooms. ? Limit these  Grains: Prepackaged pasta or rice dishes. Prepackaged cereal with added sugar.  Vegetables: Deep fried potatoes (french fries).  Fruits: Fruit canned in syrup.  Meats  and other protein foods: Beef. Pork. Lamb. Poultry with skin. Hot dogs. Berniece Salines.  Dairy: Ice cream. Sour cream. Whole milk.  Beverages: Juice. Sugar-sweetened soft  drinks. Beer. Liquor and spirits.  Fats and oils: Butter. Canola oil. Vegetable oil. Beef fat (tallow). Lard.  Sweets and desserts: Cookies. Cakes. Pies. Candy.  Seasoning and other foods: Mayonnaise. Premade sauces and marinades. The items listed may not be a complete list. Talk with your dietitian about what dietary choices are right for you. Summary  The Mediterranean diet includes both food and lifestyle choices.  Eat a variety of fresh fruits and vegetables, beans, nuts, seeds, and whole grains.  Limit the amount of red meat and sweets that you eat.  Talk with your health care provider about whether it is safe for you to drink red wine in moderation. This means 1 glass a day for nonpregnant women and 2 glasses a day for men. A glass of wine equals 5 oz (150 mL). This information is not intended to replace advice given to you by your health care provider. Make sure you discuss any questions you have with your health care provider. Document Revised: 01/21/2016 Document Reviewed: 01/14/2016 Elsevier Patient Education  2020 [employer].    Signed, Buford Dresser, MD PhD 04/08/2020     [place]

## 2020-04-08 NOTE — Patient Instructions (Addendum)
Medication Instructions:  Stop simvastatin Start rosuvastatin *If you need a refill on your cardiac medications before your next appointment, please call your pharmacy*   Lab Work: None today If you have labs (blood work) drawn today and your tests are completely normal, you will receive your results only by: Marland Kitchen MyChart Message (if you have MyChart) OR . A paper copy in the mail If you have any lab test that is abnormal or we need to change your treatment, we will call you to review the results.   Testing/Procedures: None today   Follow-Up: At [facility], you and your health needs are our priority.  As part of our continuing mission to provide you with exceptional heart care, we have created designated Provider Care Teams.  These Care Teams include your primary Cardiologist (physician) and Advanced Practice Providers (APPs -  Physician Assistants and Nurse Practitioners) who all work together to provide you with the care you need, when you need it.  We recommend signing up for the patient portal called "MyChart".  Sign up information is provided on this After Visit Summary.  MyChart is used to connect with patients for Virtual Visits (Telemedicine).  Patients are able to view lab/test results, encounter notes, upcoming appointments, etc.  Non-urgent messages can be sent to your provider as well.   To learn more about what you can do with MyChart, go to [email or web address].    Your next appointment:   2 months  The format for your next appointment:   In Person  Provider:   Buford Dresser, MD   Other Instructions Call with any issues or concerns Mediterranean Diet A Mediterranean diet refers to food and lifestyle choices that are based on the traditions of countries located on the [place]. This way of eating has been shown to help prevent certain conditions and improve outcomes for people who have chronic diseases, like kidney disease and heart  disease. What are tips for following this plan? Lifestyle  Cook and eat meals together with your family, when possible.  Drink enough fluid to keep your urine clear or pale yellow.  Be physically active every day. This includes: ? Aerobic exercise like running or swimming. ? Leisure activities like gardening, walking, or housework.  Get 7-8 hours of sleep each night.  If recommended by your health care provider, drink red wine in moderation. This means 1 glass a day for nonpregnant women and 2 glasses a day for men. A glass of wine equals 5 oz (150 mL). Reading food labels   Check the serving size of packaged foods. For foods such as rice and pasta, the serving size refers to the amount of cooked product, not dry.  Check the total fat in packaged foods. Avoid foods that have saturated fat or trans fats.  Check the ingredients list for added sugars, such as corn syrup. Shopping  At the grocery store, buy most of your food from the areas near the walls of the store. This includes: ? Fresh fruits and vegetables (produce). ? Grains, beans, nuts, and seeds. Some of these may be available in unpackaged forms or large amounts (in bulk). ? Fresh seafood. ? Poultry and eggs. ? Low-fat dairy products.  Buy whole ingredients instead of prepackaged foods.  Buy fresh fruits and vegetables in-season from local farmers markets.  Buy frozen fruits and vegetables in resealable bags.  If you do not have access to quality fresh seafood, buy precooked frozen shrimp or canned fish, such as tuna, salmon, or  sardines.  Buy small amounts of raw or cooked vegetables, salads, or olives from the deli or salad bar at your store.  Stock your pantry so you always have certain foods on hand, such as olive oil, canned tuna, canned tomatoes, rice, pasta, and beans. Cooking  Cook foods with extra-virgin olive oil instead of using butter or other vegetable oils.  Have meat as a side dish, and have  vegetables or grains as your main dish. This means having meat in small portions or adding small amounts of meat to foods like pasta or stew.  Use beans or vegetables instead of meat in common dishes like chili or lasagna.  Experiment with different cooking methods. Try roasting or broiling vegetables instead of steaming or sauteing them.  Add frozen vegetables to soups, stews, pasta, or rice.  Add nuts or seeds for added healthy fat at each meal. You can add these to yogurt, salads, or vegetable dishes.  Marinate fish or vegetables using olive oil, lemon juice, garlic, and fresh herbs. Meal planning   Plan to eat 1 vegetarian meal one day each week. Try to work up to 2 vegetarian meals, if possible.  Eat seafood 2 or more times a week.  Have healthy snacks readily available, such as: ? Vegetable sticks with hummus. ? [place] yogurt. ? Fruit and nut trail mix.  Eat balanced meals throughout the week. This includes: ? Fruit: 2-3 servings a day ? Vegetables: 4-5 servings a day ? Low-fat dairy: 2 servings a day ? Fish, poultry, or lean meat: 1 serving a day ? Beans and legumes: 2 or more servings a week ? Nuts and seeds: 1-2 servings a day ? Whole grains: 6-8 servings a day ? Extra-virgin olive oil: 3-4 servings a day  Limit red meat and sweets to only a few servings a month What are my food choices?  Mediterranean diet ? Recommended  Grains: Whole-grain pasta. Brown rice. Bulgar wheat. Polenta. Couscous. Whole-wheat bread. [place].  Vegetables: Artichokes. Beets. Broccoli. Cabbage. Carrots. Eggplant. Green beans. Chard. Kale. Spinach. Onions. Leeks. Peas. Squash. Tomatoes. Peppers. Radishes.  Fruits: Apples. Apricots. Avocado. Berries. Bananas. Cherries. Dates. Figs. Grapes. Lemons. Melon. Oranges. Peaches. Plums. Pomegranate.  Meats and other protein foods: Beans. Almonds. Sunflower seeds. Pine nuts. Peanuts. [place]. Salmon. Scallops. Shrimp. [place]. Tilapia. Clams.  Oysters. Eggs.  Dairy: Low-fat milk. Cheese. Greek yogurt.  Beverages: Water. Red wine. Herbal tea.  Fats and oils: Extra virgin olive oil. Avocado oil. Grape seed oil.  Sweets and desserts: [place] yogurt with honey. Baked apples. Poached pears. Trail mix.  Seasoning and other foods: Basil. Cilantro. Coriander. Cumin. Mint. Parsley. Sage. Rosemary. Tarragon. Garlic. Oregano. Thyme. Pepper. Balsalmic vinegar. Tahini. Hummus. Tomato sauce. Olives. Mushrooms. ? Limit these  Grains: Prepackaged pasta or rice dishes. Prepackaged cereal with added sugar.  Vegetables: Deep fried potatoes (french fries).  Fruits: Fruit canned in syrup.  Meats and other protein foods: Beef. Pork. Lamb. Poultry with skin. Hot dogs. Bernie[place] Salines.  Dairy: Ice cream. Sour cream. Whole milk.  Beverages: Juice. Sugar-sweetened soft drinks. Beer. Liquor and spirits.  Fats and oils: Butter. Canola oil. Vegetable oil. Beef fat (tallow). Lard.  Sweets and desserts: Cookies. Cakes. Pies. Candy.  Seasoning and other foods: Mayonnaise. Premade sauces and marinades. The items listed may not be a complete list. Talk with your dietitian about what dietary choices are right for you. Summary  The Mediterranean diet includes both food and lifestyle choices.  Eat a variety of fresh fruits and vegetables, beans,  nuts, seeds, and whole grains.  Limit the amount of red meat and sweets that you eat.  Talk with your health care provider about whether it is safe for you to drink red wine in moderation. This means 1 glass a day for nonpregnant women and 2 glasses a day for men. A glass of wine equals 5 oz (150 mL). This information is not intended to replace advice given to you by your health care provider. Make sure you discuss any questions you have with your health care provider. Document Revised: 01/21/2016 Document Reviewed: 01/14/2016 Elsevier Patient Education  [place].

## 2020-04-13 ENCOUNTER — Telehealth: Payer: Self-pay | Admitting: Cardiology

## 2020-04-13 NOTE — Telephone Encounter (Signed)
Referred patient back to PCP for Flomax.  Patient would like to talk about nitric oxide and a vitamin called K2. He reports if you add K7 he believes it will remove the calcium from walls in his arteries. He would like to discuss this further with Dr. Harrell Gave.   Will route to Dr. Harrell Gave and primary nurse for review.

## 2020-04-13 NOTE — Telephone Encounter (Signed)
Patient returning call from [facility]

## 2020-04-13 NOTE — Telephone Encounter (Signed)
Pt called in and stated that he would like to know if Dr Harrell Gave would be able to take over the following med   tamsulosin (FLOMAX) 0.4 MG CAPS capsule [[id number]]    Best number [phone]

## 2020-04-14 DIAGNOSIS — N400 Enlarged prostate without lower urinary tract symptoms: Secondary | ICD-10-CM | POA: Diagnosis not present

## 2020-04-14 DIAGNOSIS — E785 Hyperlipidemia, unspecified: Secondary | ICD-10-CM | POA: Diagnosis not present

## 2020-04-14 DIAGNOSIS — J45909 Unspecified asthma, uncomplicated: Secondary | ICD-10-CM | POA: Diagnosis not present

## 2020-04-14 DIAGNOSIS — I2510 Atherosclerotic heart disease of native coronary artery without angina pectoris: Secondary | ICD-10-CM | POA: Diagnosis not present

## 2020-04-14 DIAGNOSIS — E039 Hypothyroidism, unspecified: Secondary | ICD-10-CM | POA: Diagnosis not present

## 2020-04-14 DIAGNOSIS — K219 Gastro-esophageal reflux disease without esophagitis: Secondary | ICD-10-CM | POA: Diagnosis not present

## 2020-04-14 DIAGNOSIS — I25119 Atherosclerotic heart disease of native coronary artery with unspecified angina pectoris: Secondary | ICD-10-CM | POA: Diagnosis not present

## 2020-04-14 DIAGNOSIS — G459 Transient cerebral ischemic attack, unspecified: Secondary | ICD-10-CM | POA: Diagnosis not present

## 2020-04-14 DIAGNOSIS — I10 Essential (primary) hypertension: Secondary | ICD-10-CM | POA: Diagnosis not present

## 2020-04-14 NOTE — Telephone Encounter (Signed)
Left message to call back  

## 2020-04-14 NOTE — Telephone Encounter (Signed)
Yes, we discussed that he will need a primary care doctor for his non-cardiac medications. Thank you.

## 2020-04-15 ENCOUNTER — Telehealth: Payer: Self-pay | Admitting: Cardiology

## 2020-04-15 ENCOUNTER — Telehealth (HOSPITAL_COMMUNITY): Payer: Self-pay

## 2020-04-15 NOTE — Telephone Encounter (Signed)
Pt insurance is active and benefits verified through [facility]. Co-pay $10.00, DED $0.00/$0.00 met, out of pocket $3,900.00/$125.00 met, co-insurance 0%. No pre-authorization required. Passport, 04/15/20 @ 11:02AM, QHU#[id number]  Will contact patient to see if he is interested in the Cardiac Rehab Program.

## 2020-04-15 NOTE — Telephone Encounter (Signed)
Follow up:     Patient calling to see the name of the nurse that assisted the doctor that did his surgery. Please call patient back.

## 2020-04-15 NOTE — Telephone Encounter (Signed)
Pt called advice line to find out name of staff member who was present for his cardiac cath procedure. Gave him number to cath lab.

## 2020-04-15 NOTE — Telephone Encounter (Signed)
Pt is returning call.  

## 2020-04-21 NOTE — Telephone Encounter (Signed)
There is no approved treatment for removing calcium from the arteries. The treatments marketed for this have not been approved by the FDA. I would continue with the current medication plan.

## 2020-04-23 NOTE — Telephone Encounter (Signed)
Pt updated and verbalized understanding.  

## 2020-06-08 ENCOUNTER — Encounter: Payer: Self-pay | Admitting: Cardiology

## 2020-06-08 DIAGNOSIS — Z955 Presence of coronary angioplasty implant and graft: Secondary | ICD-10-CM | POA: Insufficient documentation

## 2020-06-08 DIAGNOSIS — E7800 Pure hypercholesterolemia, unspecified: Secondary | ICD-10-CM | POA: Insufficient documentation

## 2020-06-18 ENCOUNTER — Telehealth: Payer: Self-pay | Admitting: Cardiology

## 2020-06-18 NOTE — Telephone Encounter (Signed)
Patient states he has found "something" for his brain and wants to make sure it is safe for him to take before he orders.  If we could check with Dr. Harrell Gave and call him back.

## 2020-06-18 NOTE — Telephone Encounter (Signed)
Left message to call back  

## 2020-06-19 NOTE — Telephone Encounter (Signed)
2nd attempt to reach pt. Left message to call back.

## 2020-06-22 NOTE — Telephone Encounter (Signed)
Called patient, LVM advising to call back to discuss what he was asking to take.  Left call back number.  After third attempt will remove from triage pool.

## 2020-06-25 ENCOUNTER — Telehealth: Payer: Self-pay | Admitting: Cardiology

## 2020-06-25 NOTE — Telephone Encounter (Signed)
New Message:     Pt says he would like to talk to [facility], concerning a supplement(Cardio Clear 7). It have these  3 ingredients- COQ 10, Shilajit and PQQ-Mitochondria  he wants to take.

## 2020-06-25 NOTE — Telephone Encounter (Signed)
Pt called stating he would like Dr. Harrell Gave to explore Cardio Clear 7 to see if she is ok with him taking this supplement. He report the medication have 3 main ingredients, CoQ10, PQQ, and Silajit that will help prevent plaque build up, prevent heart from working over time, improves energy, and brain quality.  Pt also questioning if he should continue taking ASA daily with plavix.   Message routed for recommendations.

## 2020-06-26 NOTE — Telephone Encounter (Signed)
Pt updated with MD's recommendation and verbalized understanding.  

## 2020-06-26 NOTE — Telephone Encounter (Signed)
I looked at the supplement. CoQ10 has been studied and is fine, but the other ingredients have not been studied in clinical trials, so I cannot comment on the safety/efficacy. He absolutely needs to stay on both aspirin and plavix given his stents. Thank you.

## 2020-06-29 DIAGNOSIS — R059 Cough, unspecified: Secondary | ICD-10-CM | POA: Diagnosis not present

## 2020-06-29 DIAGNOSIS — R0981 Nasal congestion: Secondary | ICD-10-CM | POA: Diagnosis not present

## 2020-06-29 DIAGNOSIS — J029 Acute pharyngitis, unspecified: Secondary | ICD-10-CM | POA: Diagnosis not present

## 2020-06-30 ENCOUNTER — Telehealth: Payer: Self-pay | Admitting: Cardiology

## 2020-06-30 NOTE — Telephone Encounter (Signed)
Gary Riggs is calling to schedule his 2 month f/u that is due this month due to never receiving a call to schedule. The first available with Dr. Harrell Gave at this time is 08/31/20. Due to this [place] is requesting a callback from [place] to try and be worked in somewhere. Please advise.

## 2020-06-30 NOTE — Telephone Encounter (Signed)
Spoke with pt and scheduled f/u appointment for 2/4 with Dr. Harrell Gave.

## 2020-07-06 DIAGNOSIS — K219 Gastro-esophageal reflux disease without esophagitis: Secondary | ICD-10-CM | POA: Diagnosis not present

## 2020-07-06 DIAGNOSIS — G459 Transient cerebral ischemic attack, unspecified: Secondary | ICD-10-CM | POA: Diagnosis not present

## 2020-07-06 DIAGNOSIS — E785 Hyperlipidemia, unspecified: Secondary | ICD-10-CM | POA: Diagnosis not present

## 2020-07-06 DIAGNOSIS — I25119 Atherosclerotic heart disease of native coronary artery with unspecified angina pectoris: Secondary | ICD-10-CM | POA: Diagnosis not present

## 2020-07-06 DIAGNOSIS — E039 Hypothyroidism, unspecified: Secondary | ICD-10-CM | POA: Diagnosis not present

## 2020-07-06 DIAGNOSIS — N400 Enlarged prostate without lower urinary tract symptoms: Secondary | ICD-10-CM | POA: Diagnosis not present

## 2020-07-06 DIAGNOSIS — I10 Essential (primary) hypertension: Secondary | ICD-10-CM | POA: Diagnosis not present

## 2020-07-06 DIAGNOSIS — J45909 Unspecified asthma, uncomplicated: Secondary | ICD-10-CM | POA: Diagnosis not present

## 2020-07-06 DIAGNOSIS — I2510 Atherosclerotic heart disease of native coronary artery without angina pectoris: Secondary | ICD-10-CM | POA: Diagnosis not present

## 2020-07-07 ENCOUNTER — Telehealth (HOSPITAL_COMMUNITY): Payer: Self-pay

## 2020-07-07 ENCOUNTER — Encounter (HOSPITAL_COMMUNITY): Payer: Self-pay

## 2020-07-07 DIAGNOSIS — N400 Enlarged prostate without lower urinary tract symptoms: Secondary | ICD-10-CM | POA: Diagnosis not present

## 2020-07-07 DIAGNOSIS — E039 Hypothyroidism, unspecified: Secondary | ICD-10-CM | POA: Diagnosis not present

## 2020-07-07 DIAGNOSIS — I2510 Atherosclerotic heart disease of native coronary artery without angina pectoris: Secondary | ICD-10-CM | POA: Diagnosis not present

## 2020-07-07 DIAGNOSIS — I10 Essential (primary) hypertension: Secondary | ICD-10-CM | POA: Diagnosis not present

## 2020-07-07 DIAGNOSIS — I25119 Atherosclerotic heart disease of native coronary artery with unspecified angina pectoris: Secondary | ICD-10-CM | POA: Diagnosis not present

## 2020-07-07 DIAGNOSIS — E785 Hyperlipidemia, unspecified: Secondary | ICD-10-CM | POA: Diagnosis not present

## 2020-07-07 DIAGNOSIS — J45909 Unspecified asthma, uncomplicated: Secondary | ICD-10-CM | POA: Diagnosis not present

## 2020-07-07 DIAGNOSIS — G459 Transient cerebral ischemic attack, unspecified: Secondary | ICD-10-CM | POA: Diagnosis not present

## 2020-07-07 DIAGNOSIS — K219 Gastro-esophageal reflux disease without esophagitis: Secondary | ICD-10-CM | POA: Diagnosis not present

## 2020-07-07 NOTE — Telephone Encounter (Signed)
Attempted to call patient in regards to Cardiac Rehab - LM on VM Mailed letter 

## 2020-07-07 NOTE — Telephone Encounter (Signed)
Pt states that he is walking on his own and is not interested in the cardiac rehab program. Closed referral.

## 2020-07-07 NOTE — Telephone Encounter (Signed)
Pt insurance is active and benefits verified through [facility]. Co-pay $10.00, DED $0.00/$0.00 met, out of pocket $0.00/$0.00 met, co-insurance 0%. No pre-authorization required. Passport, 07/07/20 @ 11:35AM, NOH#[id number]  Will contact patient to see if he is interested in the Cardiac Rehab Program.

## 2020-07-10 ENCOUNTER — Ambulatory Visit: Payer: Medicare HMO | Admitting: Cardiology

## 2020-07-10 ENCOUNTER — Other Ambulatory Visit: Payer: Self-pay | Admitting: Internal Medicine

## 2020-07-10 ENCOUNTER — Ambulatory Visit
Admission: RE | Admit: 2020-07-10 | Discharge: 2020-07-10 | Disposition: A | Discharge status: Home / Self Care | Payer: Medicare PPO | Source: Ambulatory Visit | Attending: Internal Medicine | Admitting: Internal Medicine

## 2020-07-10 DIAGNOSIS — M1611 Unilateral primary osteoarthritis, right hip: Secondary | ICD-10-CM | POA: Diagnosis not present

## 2020-07-10 DIAGNOSIS — M25551 Pain in right hip: Secondary | ICD-10-CM | POA: Diagnosis not present

## 2020-07-14 ENCOUNTER — Telehealth: Payer: Self-pay | Admitting: Cardiology

## 2020-07-14 ENCOUNTER — Encounter: Payer: Self-pay | Admitting: Cardiology

## 2020-07-14 NOTE — Telephone Encounter (Signed)
FYI--  Patient called to make [place] aware that Dr. Mirna Mires Dentist office will be contacting our office regarding dental work the patient is scheduled to have tomorrow, 07/15/20. He states they faxed over a clearance request in November or December of 2021, but we have no record of it in the patient's chart. The patient plans to call Dr. Evelene Croon' office to have them contact us to request the clearance over the phone. The patient states the clearance will be for a cleaning and he provided their phone number incase we are able to call before they contact us.  Phone # Mirna Mires DDS): [phone]

## 2020-07-14 NOTE — Telephone Encounter (Signed)
Error

## 2020-07-15 ENCOUNTER — Telehealth: Payer: Self-pay | Admitting: Cardiology

## 2020-07-15 NOTE — Telephone Encounter (Signed)
I unfortunately do not have a way to speed up that appt. I would recommend he take the Monday appt and ask to be placed on the cancellation list in case something opens up sooner.

## 2020-07-15 NOTE — Telephone Encounter (Signed)
Left patient a message to call back.

## 2020-07-15 NOTE — Telephone Encounter (Signed)
Patient returned call. I informed him of Dr. Judeth Cornfield recommendation, per previous message. No further questions.

## 2020-07-15 NOTE — Telephone Encounter (Signed)
Patient called and would like to talk with Dr. 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 Please call back

## 2020-07-15 NOTE — Telephone Encounter (Signed)
Left message to call back  

## 2020-07-15 NOTE — Telephone Encounter (Signed)
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 Patient is upset because ortho can not get him In until Monday of next week. He would like to know if Dr. Harrell Gave has any other recommendations so that he can been seen sooner. Encouraged patient to go ahead and take the Monday appointment for now and I would let him know if Dr. Harrell Gave has any further advisement.

## 2020-07-16 NOTE — Telephone Encounter (Signed)
1. What dental office are you calling from? Dr. [facility]  2. What is your office phone number? [phone]  3. What is your fax number? [phone]  4. What type of procedure is the patient having performed? cleaning  5. What date is procedure scheduled or is the patient there now? Not scheduled (if the patient is at the dentist's office question goes to their cardiologist if he/she is in the office.  If not, question should go to the DOD).   6. What is your question (ex. Antibiotics prior to procedure, holding medication-we need to know how long dentist wants pt to hold med)? Does he have to take an antibiotic   Kim from Dr. Evelene Croon office states they just need a release stating the patient can have a dental cleaning and whether he needs antibiotics prior.

## 2020-07-16 NOTE — Telephone Encounter (Signed)
   Primary Cardiologist: Buford Dresser, MD  Chart reviewed as part of pre-operative protocol coverage. Patient with h/o HTN, hypothyroidism, BPH, CAD and PCI 03/2020, diverticulosis. No prior history of valvular repair.  IF SIMPLE EXTRACTION/CLEANINGS: Simple dental extractions and cleanings are considered low risk procedures per guidelines and generally do not require any specific cardiac clearance. It is also generally accepted that for simple extractions and dental cleanings, there is no need to interrupt blood thinner therapy. He had recent stenting in 03/2020 so risk of interrupting his blood thinners far outweighs benefit of holding.  SBE prophylaxis is not required for the patient from a cardiac standpoint.  I will route this recommendation to the requesting party via Epic fax function and remove from pre-op pool.  Please call with questions.  Charlie Pitter, PA-C 07/16/2020, 9:49 AM

## 2020-07-20 DIAGNOSIS — M1611 Unilateral primary osteoarthritis, right hip: Secondary | ICD-10-CM | POA: Diagnosis not present

## 2020-07-20 DIAGNOSIS — M25551 Pain in right hip: Secondary | ICD-10-CM | POA: Diagnosis not present

## 2020-07-22 NOTE — Progress Notes (Signed)
Cardiology Office Note   Date:  07/24/2020   ID:  Gary, Riggs 1930/10/13, MRN [id number]  PCP:  Gary Huddle, MD  Cardiologist: Dr. Harrell Riggs No chief complaint on file.    History of Present Illness: Gary Riggs is a 87 y.o. male who presents for for ongoing assessment and management of hypertension, with history of syncopal episode in September 2021 while in church along with chest discomfort.  The patient did have a follow-up stress test which was significantly normal.  He subsequently had cardiac catheterization on 04/01/2020 revealing severe three-vessel coronary artery disease including an 80% to 95% proximal/mid LAD, ostial and mid left circumflex, and mid right coronary artery stenosis.  The proximal/mid LAD and ostial left circumflex lesions were heavily calcified.  He was recommended for CABG due to unexplained syncope and recent high risk stress test.  However the patient did undergo PCI on 03/31/2020 with a drug-eluting stent placed in the proximal to mid LAD lesion, post intervention there was 0% residual stenosis.  He also had a drug-eluting stent placed to the 80% mid RCA lesion.  Complex circumflex was medically managed.  On follow-up with Dr. Harrell Riggs on 04/08/2020, the patient was doing well, declined CABG per her notes, and had been medically compliant.  He was continued on dual antiplatelet therapy with clopidogrel and aspirin 81 mg, continued on rosuvastatin 20 mg daily, continued on losartan HCTZ.  He did call our office on 07/15/2020 due to severe pain from arthritis and requested that we try and help him get a sooner appointment with his orthopedist.  He was advised to keep his appointment on 07/20/2020 as we were unable to expedite appointment time.  He comes today and has spoken at length about his trial concerning his right hip pain since his football injury as a teenager.  He is now going to see [place] and is going to have a TENS unit  placed.  He also comes with 3 herbal medications that he wishes to start taking, and/or switch out from the medications he is taking now.  One medication, "AloeCure" for GERD.  He also wants to switch his losartan to a nitrate oxide supplement because he read an article this and feels it would work better for him. He is adamant about stopping Rx and transitioning to herbal supplements.    Past Medical History:  Diagnosis Date  . Coronary artery disease   . Diverticulosis   . DJD (degenerative joint disease)   . ED (erectile dysfunction)   . Hemorrhoids   . Hiatal hernia   . Hypertension   . Onychomycosis   . Rosacea   . Thyroid disease     Past Surgical History:  Procedure Laterality Date  . CORONARY STENT INTERVENTION N/A 03/31/2020   Procedure: CORONARY STENT INTERVENTION;  Surgeon: Gary Booze, MD;  Location: [facility];  Service: Cardiovascular;  Laterality: N/A;  . INTRAVASCULAR ULTRASOUND/IVUS N/A 03/31/2020   Procedure: Intravascular Ultrasound/IVUS;  Surgeon: Gary Booze, MD;  Location: [facility];  Service: Cardiovascular;  Laterality: N/A;  . LEFT HEART CATH N/A 03/31/2020   Procedure: Left Heart Cath;  Surgeon: Gary Booze, MD;  Location: [facility];  Service: Cardiovascular;  Laterality: N/A;  . LEFT HEART CATH AND CORONARY ANGIOGRAPHY N/A 03/30/2020   Procedure: LEFT HEART CATH AND CORONARY ANGIOGRAPHY;  Surgeon: Gary Bush, MD;  Location: [facility];  Service: Cardiovascular;  Laterality: N/A;     Current  Outpatient Medications  Medication Sig Dispense Refill  . Ascorbic Acid (VITAMIN C) 1000 MG tablet Take 1,000 mg by mouth daily.    Marland Kitchen aspirin 81 MG chewable tablet Chew 81 mg by mouth daily.     . Cholecalciferol (VITAMIN D-3) 1000 UNITS CAPS Take 1,000 Units by mouth daily.     . clopidogrel (PLAVIX) 75 MG tablet Take 1 tablet (75 mg total) by mouth daily with breakfast. 90 tablet 2  . Cyanocobalamin  (VITAMIN B-12 PO) Take 2,500 mcg by mouth daily.     Marland Kitchen GLUCOS-CHONDROIT-MSM-C-HYAL PO Take 1 tablet by mouth daily. 1500/1200    . levothyroxine (SYNTHROID) 137 MCG tablet Take 137 mcg by mouth daily before breakfast.     . losartan-hydrochlorothiazide (HYZAAR) 100-12.5 MG per tablet Take 1 tablet by mouth daily.    . Multiple Minerals-Vitamins (CALCIUM CITRATE-MAG-MINERALS PO) Take 1 tablet by mouth daily. Vit D3/ Zinc    . Multiple Vitamin (MULTIVITAMIN) capsule Take 1 capsule by mouth daily.    . nitroGLYCERIN (NITROSTAT) 0.4 MG SL tablet Place 0.4 mg under the tongue every 5 (five) minutes as needed for chest pain.    Marland Kitchen OVER THE COUNTER MEDICATION Take 1 capsule by mouth daily. [employer]    . RESVERATROL PO Take 100 mg by mouth daily. Omega Q plus    . rosuvastatin (CRESTOR) 20 MG tablet Take 1 tablet (20 mg total) by mouth daily. 90 tablet 3  . tamsulosin (FLOMAX) 0.4 MG CAPS capsule Take 0.4 mg by mouth at bedtime.      No current facility-administered medications for this visit.    Allergies:   Aleve [naproxen sodium], Tussionex pennkinetic er [hydrocod polst-cpm polst er], and Synthroid [levothyroxine sodium]    Social History:  The patient  reports that he has never smoked. He has never used smokeless tobacco. He reports that he does not drink alcohol and does not use drugs.   Family History:  The patient's Family history is unknown by patient.    ROS: All other systems are reviewed and negative. Unless otherwise mentioned in H&P    PHYSICAL EXAM: VS:  BP 138/68   Pulse 60   Ht 6\' 1"  (1.854 m)   Wt 194 lb 3.2 oz (88.1 kg)   SpO2 96%   BMI 25.62 kg/m  , BMI Body mass index is 25.62 kg/m. GEN: Well nourished, well developed, in no acute distress HEENT: normal Neck: no JVD, radiation murmur to carotid,  or masses Cardiac: RRR; 2/6 holosystolic murmurs, rubs, or gallops,no edema  Respiratory:  Clear to auscultation bilaterally, normal work of breathing GI: soft,  nontender, nondistended, + BS MS: no deformity or atrophy Skin: warm and dry, no rash Neuro:  Strength and sensation are intact Psych: euthymic mood, full affect   EKG: Not completed this office visit Recent Labs: 03/31/2020: BUN 16; Creatinine, Ser 0.97; Potassium 4.4; Sodium 134 04/01/2020: Hemoglobin 11.6; Platelets 221    Lipid Panel    Component Value Date/Time   CHOL 165 03/31/2020 0138   TRIG 82 03/31/2020 0138   HDL 45 03/31/2020 0138   CHOLHDL 3.7 03/31/2020 0138   VLDL 16 03/31/2020 0138   LDLCALC 104 (H) 03/31/2020 0138      Wt Readings from Last 3 Encounters:  07/24/20 194 lb 3.2 oz (88.1 kg)  04/08/20 191 lb 12.8 oz (87 kg)  04/01/20 184 lb 4.8 oz (83.6 kg)      Other studies Reviewed: [facility] 03/30/2020 Conclusions: 1. Severe three-vessel coronary artery  disease, including 80-95% proximal/mid LAD, ostial and mid LCx, and mid RCA stenoses.  The proximal/mid LAD and ostial LCx lesions are heavily calcified. 2. Normal left ventricular systolic function and filling pressure. 3. Possible mild aortic stenosis.  Recommendations: 1. Given unexplained syncope and recent high-risk stress test, we will admit Mr. Aspinall for cardiac surgery consultation for CABG.  If he is not a reasonable candidate for surgery or Mr. Mirabal declines CABG, multivessel PCI could be considered before discharge (this would likely require atherectomy of the LAD and ostial LCx). 2. Obtain echocardiogram. 3. Aggressive secondary prevention.  Gary Bush, MD [facility]    Recommendations   Stent Intervention 03/31/2020 Conclusion    Ost Cx to Prox Cx lesion is 90% stenosed.  Mid Cx lesion is 95% stenosed.  Mid RCA lesion is 80% stenosed.  A drug-eluting stent was successfully placed using a STENT RESOLUTE ONYX 3.5X15, postdilated to 3.9 mm and optimized with IVUS.  Post intervention, there is a 0% residual stenosis.  Prox LAD to Mid LAD lesion is 90% stenosed.  A  drug-eluting stent was successfully placed using a [place] 6.72C94, postdilated to 3.3 mm and optimized with IVUS.  Post intervention, there is a 0% residual stenosis.  LV end diastolic pressure is normal.  There is mild aortic valve stenosis.   Continue dual antiplatelet therapy along with aggressive secondary prevention.  Consider clopidogrel monotherapy after DAPT.    Medical management of complex circumflex disease.   Echocardiogram 03/30/2020 1. Left ventricular ejection fraction, by estimation, is 55 to 60%. The  left ventricle has normal function. The left ventricle has no regional  wall motion abnormalities. There is severe concentric left ventricular  hypertrophy. Left ventricular diastolic  parameters are consistent with Grade II diastolic dysfunction  (pseudonormalization).  2. Right ventricular systolic function is normal. The right ventricular  size is normal. There is mildly elevated pulmonary artery systolic  pressure.  3. Left atrial size was moderately dilated.  4. Right atrial size was mildly dilated.  5. Eccentric MR with horizontal splay; likely underestimation of  regurgitation. The mitral valve is grossly normal. Mild mitral valve  regurgitation.  6. The aortic valve is tricuspid. There is moderate calcification of the  aortic valve. Aortic valve regurgitation is not visualized. Mild aortic  valve sclerosis is present, with no evidence of aortic valve stenosis.  Aortic valve area, by VTI measures  1.78 cm. Aortic valve mean gradient measures 8.3 mmHg. Aortic valve Vmax  measures 1.91 m/s.  7. There is borderline dilatation of the ascending aorta, measuring 39  mm.  8. The inferior vena cava is normal in size with greater than 50%  respiratory variability, suggesting right atrial pressure of 3 mmHg.   ASSESSMENT AND PLAN:  1.  Coronary artery disease: Difficult encounter as the patient had been reading and ordering herbal supplements  which she feels would work better for him instead of the research/guideline directed medications for CAD.  Had a hard time convincing him that we will need to continue his losartan and not stop it in order for him to take nitric oxide supplements instead.  He appeared incredulous but was willing to continue his current medicine.  I have also advised him that he is not to take Aloe Cure supplements for GERD symptoms as this will increase bleeding time, as he is on Plavix and aspirin I do not wish to have any issues with this.  It is okay for him to take co-Q10 or  joint pain as he is on statin.  2.  Hypertension: Blood pressure is currently well controlled.  I have reinforced that he should not take the nitric oxide supplement to avoid hypotension.  He continues to insist that taking him off of the losartan would be best and instead allow the natural supplements to treat his blood pressure as it had so many other good attributes according to the article he brought with him.  This was an advertisement for this medication.  I think I have convinced him not to make the change and given him regional explanations concerning this.  Invariably, the decision will be his even if it is AMA.  3.  Hyperlipidemia: He will continue on Crestor as directed.  Co-Q10 for joint pain is fine.  4.  Osteoarthritis of the right hip: He speaks at length about this.  Planning on following up with orthopedics and having a TENS unit placed.   Current medicines are reviewed at length with the patient today.  I have spent 45 min's dedicated to the care of this patient on the date of this encounter to include pre-visit review of records, assessment, management and diagnostic testing,with shared decision making.  Labs/ tests ordered today include: None  Phill Myron. West Pugh, ANP, AACC   07/24/2020 4:56 PM    [facility] [place] [address] Office [phone] Fax [phone]  Notice: This  dictation was prepared with Dragon dictation along with smaller phrase technology. Any transcriptional errors that result from this process are unintentional and may not be corrected upon review.

## 2020-07-24 ENCOUNTER — Ambulatory Visit (INDEPENDENT_AMBULATORY_CARE_PROVIDER_SITE_OTHER): Payer: Medicare HMO | Admitting: Adult Health

## 2020-07-24 ENCOUNTER — Encounter: Payer: Self-pay | Admitting: Adult Health

## 2020-07-24 ENCOUNTER — Other Ambulatory Visit: Payer: Self-pay

## 2020-07-24 VITALS — BP 138/68 | HR 60 | Ht 73.0 in | Wt 194.2 lb

## 2020-07-24 DIAGNOSIS — Z7189 Other specified counseling: Secondary | ICD-10-CM | POA: Diagnosis not present

## 2020-07-24 DIAGNOSIS — I2510 Atherosclerotic heart disease of native coronary artery without angina pectoris: Secondary | ICD-10-CM

## 2020-07-24 DIAGNOSIS — E7800 Pure hypercholesterolemia, unspecified: Secondary | ICD-10-CM | POA: Diagnosis not present

## 2020-07-24 DIAGNOSIS — I10 Essential (primary) hypertension: Secondary | ICD-10-CM | POA: Diagnosis not present

## 2020-07-24 NOTE — Patient Instructions (Addendum)
Medication Instructions:   DO NOT TAKE AloeCure supplement  DO NOT TAKE Nitric Oxide supplement  *If you need a refill on your cardiac medications before your next appointment, please call your pharmacy*  Lab Work: NONE ordered at this time of appointment   If you have labs (blood work) drawn today and your tests are completely normal, you will receive your results only by: Marland Kitchen MyChart Message (if you have MyChart) OR . A paper copy in the mail If you have any lab test that is abnormal or we need to change your treatment, we will call you to review the results.  Testing/Procedures: NONE ordered at this time of appointment   Follow-Up: At [facility], you and your health needs are our priority.  As part of our continuing mission to provide you with exceptional heart care, we have created designated Provider Care Teams.  These Care Teams include your primary Cardiologist (physician) and Advanced Practice Providers (APPs -  Physician Assistants and Nurse Practitioners) who all work together to provide you with the care you need, when you need it.  Your next appointment:   3 month(s)  The format for your next appointment:   In Person  Provider:   Buford Dresser, MD  Other Instructions

## 2020-07-28 DIAGNOSIS — M1611 Unilateral primary osteoarthritis, right hip: Secondary | ICD-10-CM | POA: Diagnosis not present

## 2020-07-28 DIAGNOSIS — R262 Difficulty in walking, not elsewhere classified: Secondary | ICD-10-CM | POA: Diagnosis not present

## 2020-07-28 DIAGNOSIS — M25651 Stiffness of right hip, not elsewhere classified: Secondary | ICD-10-CM | POA: Diagnosis not present

## 2020-08-21 ENCOUNTER — Telehealth: Payer: Self-pay | Admitting: Cardiology

## 2020-08-21 NOTE — Telephone Encounter (Signed)
STAT if patient feels like he/she is going to faint   1) Are you dizzy now? no  2) Do you feel faint or have you passed out? no  3) Do you have any other symptoms? Lightheadedness  4) Have you checked your HR and BP (record if available)? No  Patient is calling stating when he wakes up in the morning if he forgets to take his vitamins to put water in his system he feels lightheaded. His daughter advises its not normal and requested that he calls. Please advise

## 2020-08-21 NOTE — Telephone Encounter (Signed)
Called patient, LVM to call back on Monday or to contact the on call provider this weekend if continued issues.  Patient left call back number.

## 2020-08-28 NOTE — Telephone Encounter (Signed)
Left message for pt to call.

## 2020-08-28 NOTE — Telephone Encounter (Signed)
Spoke with pt, it reports the lightheadedness has gotten better since increasing his fluid intake. If he delays the fluid intake in the morning is when he gets lightheaded. He is not able to check his bp. He will increase his fluids and get something to check his bp and let us know if things continue or get worse.

## 2020-08-31 DIAGNOSIS — M1611 Unilateral primary osteoarthritis, right hip: Secondary | ICD-10-CM | POA: Diagnosis not present

## 2020-08-31 DIAGNOSIS — R262 Difficulty in walking, not elsewhere classified: Secondary | ICD-10-CM | POA: Diagnosis not present

## 2020-08-31 DIAGNOSIS — M25651 Stiffness of right hip, not elsewhere classified: Secondary | ICD-10-CM | POA: Diagnosis not present

## 2020-09-03 DIAGNOSIS — G459 Transient cerebral ischemic attack, unspecified: Secondary | ICD-10-CM | POA: Diagnosis not present

## 2020-09-03 DIAGNOSIS — J45909 Unspecified asthma, uncomplicated: Secondary | ICD-10-CM | POA: Diagnosis not present

## 2020-09-03 DIAGNOSIS — I2510 Atherosclerotic heart disease of native coronary artery without angina pectoris: Secondary | ICD-10-CM | POA: Diagnosis not present

## 2020-09-03 DIAGNOSIS — I10 Essential (primary) hypertension: Secondary | ICD-10-CM | POA: Diagnosis not present

## 2020-09-03 DIAGNOSIS — I25119 Atherosclerotic heart disease of native coronary artery with unspecified angina pectoris: Secondary | ICD-10-CM | POA: Diagnosis not present

## 2020-09-03 DIAGNOSIS — K219 Gastro-esophageal reflux disease without esophagitis: Secondary | ICD-10-CM | POA: Diagnosis not present

## 2020-09-03 DIAGNOSIS — E039 Hypothyroidism, unspecified: Secondary | ICD-10-CM | POA: Diagnosis not present

## 2020-09-03 DIAGNOSIS — N400 Enlarged prostate without lower urinary tract symptoms: Secondary | ICD-10-CM | POA: Diagnosis not present

## 2020-09-03 DIAGNOSIS — E785 Hyperlipidemia, unspecified: Secondary | ICD-10-CM | POA: Diagnosis not present

## 2020-09-08 DIAGNOSIS — M25651 Stiffness of right hip, not elsewhere classified: Secondary | ICD-10-CM | POA: Diagnosis not present

## 2020-09-08 DIAGNOSIS — R262 Difficulty in walking, not elsewhere classified: Secondary | ICD-10-CM | POA: Diagnosis not present

## 2020-09-08 DIAGNOSIS — M1611 Unilateral primary osteoarthritis, right hip: Secondary | ICD-10-CM | POA: Diagnosis not present

## 2020-09-14 ENCOUNTER — Telehealth: Payer: Self-pay | Admitting: Cardiology

## 2020-09-14 DIAGNOSIS — G459 Transient cerebral ischemic attack, unspecified: Secondary | ICD-10-CM | POA: Diagnosis not present

## 2020-09-14 DIAGNOSIS — I10 Essential (primary) hypertension: Secondary | ICD-10-CM | POA: Diagnosis not present

## 2020-09-14 DIAGNOSIS — I25119 Atherosclerotic heart disease of native coronary artery with unspecified angina pectoris: Secondary | ICD-10-CM | POA: Diagnosis not present

## 2020-09-14 DIAGNOSIS — J45909 Unspecified asthma, uncomplicated: Secondary | ICD-10-CM | POA: Diagnosis not present

## 2020-09-14 DIAGNOSIS — E785 Hyperlipidemia, unspecified: Secondary | ICD-10-CM | POA: Diagnosis not present

## 2020-09-14 DIAGNOSIS — K219 Gastro-esophageal reflux disease without esophagitis: Secondary | ICD-10-CM | POA: Diagnosis not present

## 2020-09-14 DIAGNOSIS — I2510 Atherosclerotic heart disease of native coronary artery without angina pectoris: Secondary | ICD-10-CM | POA: Diagnosis not present

## 2020-09-14 DIAGNOSIS — N400 Enlarged prostate without lower urinary tract symptoms: Secondary | ICD-10-CM | POA: Diagnosis not present

## 2020-09-14 DIAGNOSIS — E039 Hypothyroidism, unspecified: Secondary | ICD-10-CM | POA: Diagnosis not present

## 2020-09-14 NOTE — Telephone Encounter (Signed)
Patient called to talk with Dr. Harrell Gave or nurse. Would not go into details on why.

## 2020-09-14 NOTE — Telephone Encounter (Signed)
Spoke to patient he wanted to know if ok to take [employer] Drink.Stated it is a fruit drink that increases energy and reduces brain fog.Advised I will send message to pharmacist for advice.

## 2020-09-14 NOTE — Telephone Encounter (Signed)
Returned call to patient left Raquel's advice on personal voice mail.

## 2020-09-14 NOTE — Telephone Encounter (Signed)
Okay to take Phyto Fruits if desired. Has no contraindication or interaction with cardiac medication or cardiac health.

## 2020-09-15 DIAGNOSIS — M25651 Stiffness of right hip, not elsewhere classified: Secondary | ICD-10-CM | POA: Diagnosis not present

## 2020-09-15 DIAGNOSIS — M25551 Pain in right hip: Secondary | ICD-10-CM | POA: Diagnosis not present

## 2020-09-15 DIAGNOSIS — M1611 Unilateral primary osteoarthritis, right hip: Secondary | ICD-10-CM | POA: Diagnosis not present

## 2020-09-15 DIAGNOSIS — R262 Difficulty in walking, not elsewhere classified: Secondary | ICD-10-CM | POA: Diagnosis not present

## 2020-09-22 DIAGNOSIS — R262 Difficulty in walking, not elsewhere classified: Secondary | ICD-10-CM | POA: Diagnosis not present

## 2020-09-22 DIAGNOSIS — M1611 Unilateral primary osteoarthritis, right hip: Secondary | ICD-10-CM | POA: Diagnosis not present

## 2020-09-22 DIAGNOSIS — M25651 Stiffness of right hip, not elsewhere classified: Secondary | ICD-10-CM | POA: Diagnosis not present

## 2020-09-29 DIAGNOSIS — R262 Difficulty in walking, not elsewhere classified: Secondary | ICD-10-CM | POA: Diagnosis not present

## 2020-09-29 DIAGNOSIS — M1611 Unilateral primary osteoarthritis, right hip: Secondary | ICD-10-CM | POA: Diagnosis not present

## 2020-09-29 DIAGNOSIS — M25651 Stiffness of right hip, not elsewhere classified: Secondary | ICD-10-CM | POA: Diagnosis not present

## 2020-10-08 DIAGNOSIS — I25119 Atherosclerotic heart disease of native coronary artery with unspecified angina pectoris: Secondary | ICD-10-CM | POA: Diagnosis not present

## 2020-10-08 DIAGNOSIS — J45909 Unspecified asthma, uncomplicated: Secondary | ICD-10-CM | POA: Diagnosis not present

## 2020-10-08 DIAGNOSIS — K219 Gastro-esophageal reflux disease without esophagitis: Secondary | ICD-10-CM | POA: Diagnosis not present

## 2020-10-08 DIAGNOSIS — I10 Essential (primary) hypertension: Secondary | ICD-10-CM | POA: Diagnosis not present

## 2020-10-08 DIAGNOSIS — E785 Hyperlipidemia, unspecified: Secondary | ICD-10-CM | POA: Diagnosis not present

## 2020-10-08 DIAGNOSIS — E039 Hypothyroidism, unspecified: Secondary | ICD-10-CM | POA: Diagnosis not present

## 2020-10-08 DIAGNOSIS — N400 Enlarged prostate without lower urinary tract symptoms: Secondary | ICD-10-CM | POA: Diagnosis not present

## 2020-10-08 DIAGNOSIS — G459 Transient cerebral ischemic attack, unspecified: Secondary | ICD-10-CM | POA: Diagnosis not present

## 2020-10-08 DIAGNOSIS — I2510 Atherosclerotic heart disease of native coronary artery without angina pectoris: Secondary | ICD-10-CM | POA: Diagnosis not present

## 2020-10-20 DIAGNOSIS — M25651 Stiffness of right hip, not elsewhere classified: Secondary | ICD-10-CM | POA: Diagnosis not present

## 2020-10-20 DIAGNOSIS — R262 Difficulty in walking, not elsewhere classified: Secondary | ICD-10-CM | POA: Diagnosis not present

## 2020-10-20 DIAGNOSIS — M1611 Unilateral primary osteoarthritis, right hip: Secondary | ICD-10-CM | POA: Diagnosis not present

## 2020-10-22 ENCOUNTER — Ambulatory Visit: Payer: Medicare HMO | Admitting: Cardiology

## 2020-10-22 ENCOUNTER — Other Ambulatory Visit: Payer: Self-pay

## 2020-10-22 ENCOUNTER — Encounter: Payer: Self-pay | Admitting: Cardiology

## 2020-10-22 VITALS — BP 130/60 | HR 63 | Ht 73.0 in | Wt 189.0 lb

## 2020-10-22 DIAGNOSIS — E7800 Pure hypercholesterolemia, unspecified: Secondary | ICD-10-CM | POA: Diagnosis not present

## 2020-10-22 DIAGNOSIS — Z955 Presence of coronary angioplasty implant and graft: Secondary | ICD-10-CM | POA: Diagnosis not present

## 2020-10-22 DIAGNOSIS — Z7189 Other specified counseling: Secondary | ICD-10-CM | POA: Diagnosis not present

## 2020-10-22 DIAGNOSIS — I10 Essential (primary) hypertension: Secondary | ICD-10-CM

## 2020-10-22 DIAGNOSIS — I2510 Atherosclerotic heart disease of native coronary artery without angina pectoris: Secondary | ICD-10-CM | POA: Diagnosis not present

## 2020-10-22 MED ORDER — ROSUVASTATIN CALCIUM 20 MG PO TABS: 20.0000 mg | ORAL_TABLET | Freq: Every day | ORAL | 3 refills | Status: DC

## 2020-10-22 NOTE — Progress Notes (Signed)
Cardiology Office Note:    Date:  10/22/2020   ID:  Gary Riggs, DOB 09/28/1930, MRN [id number]  PCP:  Josetta Huddle, MD  Cardiologist:  Buford Dresser, MD  Referring MD: Josetta Huddle, MD   CC: follow up  History of Present Illness:    Gary Riggs is a 87 y.o. male with a hx of severe multivessel CAD s/p PCI, hypertension, hypothyroidism s/p radioactive iodine for Graves disease, BPH who is seen for follow up today. I initially saw him 03/24/20 as a new consult at the request of Josetta Huddle, MD for the evaluation and management of abnormal stress test.  Cardiac history: Noted syncope 03/01/20 in church. Also noted chest tightness the week prior at the [facility] football game, as well as a pre-syncopal event while showering. Referred for stress test. I spoke to Gary Riggs after his stress test 10/13. Stress test was significantly abnormal. Underwent cath as below, declined CABG and had PCI 03/2020.  Today: For exercise, he uses a small under-desk bike pedals. Prior to his previous testing, he tried to jog in place at home, and did not notice significant exertional symptoms. He is wondering if he should try small jogs such as to the mailbox and back.  Currently he is in physical therapy. He is unable to straighten his right leg without the use of a cane. At this time he has underwent the second injection. Fluid was found and he has been told he may have Gout.   He denies any chest pain, shortness of breath, palpitations, or exertional symptoms. No headaches, lightheadedness, or syncope to report. Also has no lower extremity edema, orthopnea or PND.   Past Medical History:  Diagnosis Date  . Coronary artery disease   . Diverticulosis   . DJD (degenerative joint disease)   . ED (erectile dysfunction)   . Hemorrhoids   . Hiatal hernia   . Hypertension   . Onychomycosis   . Rosacea   . Thyroid disease     Past Surgical History:  Procedure Laterality Date  . CORONARY  STENT INTERVENTION N/A 03/31/2020   Procedure: CORONARY STENT INTERVENTION;  Surgeon: Jettie Booze, MD;  Location: [place] [facility];  Service: Cardiovascular;  Laterality: N/A;  . INTRAVASCULAR ULTRASOUND/IVUS N/A 03/31/2020   Procedure: Intravascular Ultrasound/IVUS;  Surgeon: Jettie Booze, MD;  Location: [place] [facility];  Service: Cardiovascular;  Laterality: N/A;  . LEFT HEART CATH N/A 03/31/2020   Procedure: Left Heart Cath;  Surgeon: Jettie Booze, MD;  Location: [place] [facility];  Service: Cardiovascular;  Laterality: N/A;  . LEFT HEART CATH AND CORONARY ANGIOGRAPHY N/A 03/30/2020   Procedure: LEFT HEART CATH AND CORONARY ANGIOGRAPHY;  Surgeon: Nelva Bush, MD;  Location: [place] [facility];  Service: Cardiovascular;  Laterality: N/A;    Current Medications: Current Outpatient Medications on File Prior to Visit  Medication Sig  . Ascorbic Acid (VITAMIN C) 1000 MG tablet Take 1,000 mg by mouth daily.  Marland Kitchen aspirin 81 MG chewable tablet Chew 81 mg by mouth daily.   . Cholecalciferol (VITAMIN D-3) 1000 UNITS CAPS Take 1,000 Units by mouth daily.   . clopidogrel (PLAVIX) 75 MG tablet Take 1 tablet (75 mg total) by mouth daily with breakfast.  . Cyanocobalamin (VITAMIN B-12 PO) Take 2,500 mcg by mouth daily.   Marland Kitchen GLUCOS-CHONDROIT-MSM-C-HYAL PO Take 1 tablet by mouth daily. 1500/1200  . levothyroxine (SYNTHROID) 137 MCG tablet Take 137 mcg by mouth daily before breakfast.   .  losartan-hydrochlorothiazide (HYZAAR) 100-12.5 MG per tablet Take 1 tablet by mouth daily.  . Multiple Minerals-Vitamins (CALCIUM CITRATE-MAG-MINERALS PO) Take 1 tablet by mouth daily. Vit D3/ Zinc  . Multiple Vitamin (MULTIVITAMIN) capsule Take 1 capsule by mouth daily.  . nitroGLYCERIN (NITROSTAT) 0.4 MG SL tablet Place 0.4 mg under the tongue every 5 (five) minutes as needed for chest pain.  Marland Kitchen OVER THE COUNTER MEDICATION Take 1 capsule by mouth daily. Brain Logic  . RESVERATROL PO Take  100 mg by mouth daily. Omega Q plus  . tamsulosin (FLOMAX) 0.4 MG CAPS capsule Take 0.4 mg by mouth at bedtime.    No current facility-administered medications on file prior to visit.     Allergies:   Aleve [naproxen sodium], Tussionex pennkinetic er [hydrocod polst-cpm polst er], and Synthroid [levothyroxine sodium]   Social History   Tobacco Use  . Smoking status: Never Smoker  . Smokeless tobacco: Never Used  Vaping Use  . Vaping Use: Never used  Substance Use Topics  . Alcohol use: No  . Drug use: No    Family History: Father had Alzheimer's, mother deceased of unknown causes. Sister with diabetes.  ROS:   Please see the history of present illness.   (+) Limited ambulation, right LE Additional pertinent ROS otherwise unremarkable.  EKGs/Labs/Other Studies Reviewed:    The following studies were reviewed today:  ETT 03/18/20  Poor exercise capacity, achieved 4.6 METS  Horizontal ST segment depression was noted during stress in the III, aVF, II, V4, V5 and V6 leads.  EKG changes concerning for ischemia. Recommend referral to cardiology for further evaluation  I personally reviewed the strips as DOD that day; they are not all saved in the system. Had significant ventricular ectopy, including PVC triplet.  Cath 03/30/20 Conclusions: 1. Severe three-vessel coronary artery disease, including 80-95% proximal/mid LAD, ostial and mid LCx, and mid RCA stenoses.  The proximal/mid LAD and ostial LCx lesions are heavily calcified. 2. Normal left ventricular systolic function and filling pressure. 3. Possible mild aortic stenosis.  Recommendations: 1. Given unexplained syncope and recent high-risk stress test, we will admit Gary Riggs for cardiac surgery consultation for CABG.  If he is not a reasonable candidate for surgery or Gary Riggs declines CABG, multivessel PCI could be considered before discharge (this would likely require atherectomy of the LAD and ostial  LCx). 2. Obtain echocardiogram. 3. Aggressive secondary prevention.  Echo 03/30/20 1. Left ventricular ejection fraction, by estimation, is 55 to 60%. The  left ventricle has normal function. The left ventricle has no regional  wall motion abnormalities. There is severe concentric left ventricular  hypertrophy. Left ventricular diastolic  parameters are consistent with Grade II diastolic dysfunction  (pseudonormalization).  2. Right ventricular systolic function is normal. The right ventricular  size is normal. There is mildly elevated pulmonary artery systolic  pressure.  3. Left atrial size was moderately dilated.  4. Right atrial size was mildly dilated.  5. Eccentric MR with horizontal splay; likely underestimation of  regurgitation. The mitral valve is grossly normal. Mild mitral valve  regurgitation.  6. The aortic valve is tricuspid. There is moderate calcification of the  aortic valve. Aortic valve regurgitation is not visualized. Mild aortic  valve sclerosis is present, with no evidence of aortic valve stenosis.  Aortic valve area, by VTI measures  1.78 cm. Aortic valve mean gradient measures 8.3 mmHg. Aortic valve Vmax  measures 1.91 m/s.  7. There is borderline dilatation of the ascending aorta, measuring 39  mm.  8. The inferior vena cava is normal in size with greater than 50%  respiratory variability, suggesting right atrial pressure of 3 mmHg.   PCI 03/31/20  Ost Cx to Prox Cx lesion is 90% stenosed.  Mid Cx lesion is 95% stenosed.  Mid RCA lesion is 80% stenosed.  A drug-eluting stent was successfully placed using a STENT RESOLUTE ONYX [id number], postdilated to 3.9 mm and optimized with IVUS.  Post intervention, there is a 0% residual stenosis.  Prox LAD to Mid LAD lesion is 90% stenosed.  A drug-eluting stent was successfully placed using a Naplate [id number], postdilated to 3.3 mm and optimized with IVUS.  Post intervention, there is a 0%  residual stenosis.  LV end diastolic pressure is normal.  There is mild aortic valve stenosis.   Continue dual antiplatelet therapy along with aggressive secondary prevention.  Consider clopidogrel monotherapy after DAPT.    Medical management of complex circumflex disease.   EKG:  EKG is personally reviewed.   10/22/2020: no ECG performed 04/01/20: sinus rhythm with 1st degree AV block at 64 bpm  Recent Labs: 03/31/2020: BUN 16; Creatinine, Ser 0.97; Potassium 4.4; Sodium 134 04/01/2020: Hemoglobin 11.6; Platelets 221  Recent Lipid Panel    Component Value Date/Time   CHOL 165 03/31/2020 0138   TRIG 82 03/31/2020 0138   HDL 45 03/31/2020 0138   CHOLHDL 3.7 03/31/2020 0138   VLDL 16 03/31/2020 0138   LDLCALC 104 (H) 03/31/2020 0138    Physical Exam:    VS:  BP 130/60 (BP Location: Left Arm, Patient Position: Sitting, Cuff Size: Normal)   Pulse 63   Ht 6\' 1"  (1.854 m)   Wt 189 lb (85.7 kg)   BMI 24.94 kg/m     Wt Readings from Last 3 Encounters:  10/22/20 189 lb (85.7 kg)  07/24/20 194 lb 3.2 oz (88.1 kg)  04/08/20 191 lb 12.8 oz (87 kg)    GEN: Well nourished, well developed in no acute distress HEENT: Normal, moist mucous membranes NECK: No JVD CARDIAC: regular rhythm, occasional dropped beat, normal S1 and S2, no rubs or gallops. 2/6 systolic ejection murmur. VASCULAR: Radial and DP pulses 2+ bilaterally. No carotid bruits RESPIRATORY:  Clear to auscultation without rales, wheezing or rhonchi  ABDOMEN: Soft, non-tender, non-distended MUSCULOSKELETAL:  Ambulates independently with cane SKIN: Warm and dry, no edema NEUROLOGIC:  Alert and oriented x 3. No focal neuro deficits noted. PSYCHIATRIC:  Normal affect   ASSESSMENT:    1. Coronary artery disease involving native coronary artery of native heart without angina pectoris   2. History of coronary angioplasty with insertion of stent   3. Hypercholesterolemia   4. Essential hypertension   5. Cardiac risk  counseling   6. Counseling on health promotion and disease prevention    PLAN:    Obstructive CAD, with prior symptoms of syncope and exertional chest pain, now s/p PCI 03/2020 -cath as above, declined CABG, proceeded with PCI -has done well since that time -echo with preserved EF, mild AS, eccentric MR -continue aspirin 81 mg daily, clopidogrel 75 mg daily. Will discuss pros/cons of indefinite DAPT at follow up -continue rosuvastatin 20 mg daily -discussed use of SL NG -discussed red flag warning signs that need immediate medical attention  Hypertension: at goal today -continue losartan-HCTZ  Hypercholesterolemia: -now on rosuvastatin 20 mg daily -recheck labs due, recheck at follow up if not done sooner (lab closed by end of visit today)  Cardiac risk counseling and prevention  recommendations: -recommend heart healthy/Mediterranean diet, with whole grains, fruits, vegetable, fish, lean meats, nuts, and olive oil. Limit salt. -recommend moderate walking, 3-5 times/week for 30-50 minutes each session. Aim for at least 150 minutes.week. Goal should be pace of 3 miles/hours, or walking 1.5 miles in 30 minutes -recommend avoidance of tobacco products. Avoid excess alcohol.  Plan for follow up: 6 months or sooner PRN  Buford Dresser, MD, PhD [place]  [facility]    Medication Adjustments/Labs and Tests Ordered: Current medicines are reviewed at length with the patient today.  Concerns regarding medicines are outlined above.  No orders of the defined types were placed in this encounter.  Meds ordered this encounter  Medications  . rosuvastatin (CRESTOR) 20 MG tablet    Sig: Take 1 tablet (20 mg total) by mouth daily.    Dispense:  90 tablet    Refill:  3    Replaces simvastatin    Patient Instructions  Medication Instructions:  Your Physician recommend you continue on your current medication as directed.    *If you need a refill on your cardiac medications  before your next appointment, please call your pharmacy*   Lab Work: None   Testing/Procedures: None   Follow-Up: At [facility], you and your health needs are our priority.  As part of our continuing mission to provide you with exceptional heart care, we have created designated Provider Care Teams.  These Care Teams include your primary Cardiologist (physician) and Advanced Practice Providers (APPs -  Physician Assistants and Nurse Practitioners) who all work together to provide you with the care you need, when you need it.  We recommend signing up for the patient portal called "MyChart".  Sign up information is provided on this After Visit Summary.  MyChart is used to connect with patients for Virtual Visits (Telemedicine).  Patients are able to view lab/test results, encounter notes, upcoming appointments, etc.  Non-urgent messages can be sent to your provider as well.   To learn more about what you can do with MyChart, go to [email or web address].    Your next appointment:   6 month(s) @ [address] [place], [place] [address]   The format for your next appointment:   In Person  Provider:   Dr. Randall An [place] as a scribe for Buford Dresser, MD.,have documented all relevant documentation on the behalf of Buford Dresser, MD,as directed by  Buford Dresser, MD while in the presence of Buford Dresser, MD.  I, Buford Dresser, MD, have reviewed all documentation for this visit. The documentation on 10/22/20 for the exam, diagnosis, procedures, and orders are all accurate and complete.  Signed, Buford Dresser, MD PhD 10/22/2020     [place]

## 2020-10-22 NOTE — Patient Instructions (Addendum)
Medication Instructions:  Your Physician recommend you continue on your current medication as directed.    *If you need a refill on your cardiac medications before your next appointment, please call your pharmacy*   Lab Work: None   Testing/Procedures: None   Follow-Up: At [facility], you and your health needs are our priority.  As part of our continuing mission to provide you with exceptional heart care, we have created designated Provider Care Teams.  These Care Teams include your primary Cardiologist (physician) and Advanced Practice Providers (APPs -  Physician Assistants and Nurse Practitioners) who all work together to provide you with the care you need, when you need it.  We recommend signing up for the patient portal called "MyChart".  Sign up information is provided on this After Visit Summary.  MyChart is used to connect with patients for Virtual Visits (Telemedicine).  Patients are able to view lab/test results, encounter notes, upcoming appointments, etc.  Non-urgent messages can be sent to your provider as well.   To learn more about what you can do with MyChart, go to [email or web address].    Your next appointment:   6 month(s) @ [address] [place],  [address]   The format for your next appointment:   In Person  Provider:   Dr. Harrell Gave

## 2020-10-29 DIAGNOSIS — M25651 Stiffness of right hip, not elsewhere classified: Secondary | ICD-10-CM | POA: Diagnosis not present

## 2020-10-29 DIAGNOSIS — R262 Difficulty in walking, not elsewhere classified: Secondary | ICD-10-CM | POA: Diagnosis not present

## 2020-10-29 DIAGNOSIS — M1611 Unilateral primary osteoarthritis, right hip: Secondary | ICD-10-CM | POA: Diagnosis not present

## 2020-11-04 DIAGNOSIS — M25551 Pain in right hip: Secondary | ICD-10-CM | POA: Diagnosis not present

## 2020-12-02 DIAGNOSIS — G459 Transient cerebral ischemic attack, unspecified: Secondary | ICD-10-CM | POA: Diagnosis not present

## 2020-12-02 DIAGNOSIS — J45909 Unspecified asthma, uncomplicated: Secondary | ICD-10-CM | POA: Diagnosis not present

## 2020-12-02 DIAGNOSIS — K219 Gastro-esophageal reflux disease without esophagitis: Secondary | ICD-10-CM | POA: Diagnosis not present

## 2020-12-02 DIAGNOSIS — I10 Essential (primary) hypertension: Secondary | ICD-10-CM | POA: Diagnosis not present

## 2020-12-02 DIAGNOSIS — E785 Hyperlipidemia, unspecified: Secondary | ICD-10-CM | POA: Diagnosis not present

## 2020-12-02 DIAGNOSIS — M1611 Unilateral primary osteoarthritis, right hip: Secondary | ICD-10-CM | POA: Diagnosis not present

## 2020-12-02 DIAGNOSIS — N400 Enlarged prostate without lower urinary tract symptoms: Secondary | ICD-10-CM | POA: Diagnosis not present

## 2020-12-02 DIAGNOSIS — E039 Hypothyroidism, unspecified: Secondary | ICD-10-CM | POA: Diagnosis not present

## 2020-12-02 DIAGNOSIS — I25119 Atherosclerotic heart disease of native coronary artery with unspecified angina pectoris: Secondary | ICD-10-CM | POA: Diagnosis not present

## 2020-12-11 ENCOUNTER — Other Ambulatory Visit: Payer: Self-pay | Admitting: Cardiology

## 2020-12-25 ENCOUNTER — Telehealth: Payer: Self-pay | Admitting: Cardiology

## 2020-12-25 NOTE — Telephone Encounter (Signed)
Pt is interested in taking cbd for arthritis would like to know doctors recommendation... please advise

## 2020-12-25 NOTE — Telephone Encounter (Signed)
Is product a cream or oral med?  I can not find anything on their wbsite that lists any products  [email or web address]

## 2020-12-25 NOTE — Telephone Encounter (Signed)
Left message to call back  

## 2020-12-25 NOTE — Telephone Encounter (Signed)
Patient is returning call.  °

## 2020-12-25 NOTE — Telephone Encounter (Signed)
The product is Cannan-ease, [employer], by Jasmine Awe. Will forward to MD for advisement and PharmD.

## 2020-12-25 NOTE — Telephone Encounter (Signed)
Called pt back, he reports that the [facility] is a capsule medication. Nurse asked pt if he knows the company that makes the product, he reports he receives it from [place], but it is distributed by [employer]". The patient gave the name "Karle Barr" as one of the people through [facility] who may help with distribution of the product. Advised I would pass this information on to pharmacy team.

## 2020-12-28 NOTE — Telephone Encounter (Signed)
No information available online about this product.  In general with his medication list would recommend that he avoid CBD and his levothyroxine by at least 2 hours and that he monitor home BP, as sometimes CBD can decrease BP.  Other medications appear not to have any obvious interactions.  Would recommend that if he want to try CBD, he take it apart from medications and if no improvement within the first month, he should discontinue.

## 2020-12-28 NOTE — Telephone Encounter (Signed)
Called and spoke to patient .  Information given.  All question answered. Patient wanted to know where to get a blood pressure machine- recommendation - pharmacy , dept store or could ask medical insurance company. Patient verbalized understanding.

## 2020-12-28 NOTE — Telephone Encounter (Signed)
Patient called back - patient states he has more information  about the supplement he was to take. Patient states he was talking with his daughter. And she wanted him to call back and give  Dr Harrell Gave and pharmacist this information .  He states she wanted  something to help with pain -  right hip and knee- that is why he wants to use the CBD supplement.  Patient states the first thing on the  bottle states this is used for  stress and anxiety relief some of the ingredients are L- theanine ApresFlex(R)  Boswellellia serrata   Cell-g8hemp powder  Patient wants to know if this will be beneficial for his pain. Patient states at present time he is using  over the counter  tylenol but not on a consistent bases.  RN informed patient Dr Harrell Gave is not in the office, but will send message to both  and await their response.

## 2020-12-31 NOTE — Telephone Encounter (Signed)
Sent Mychart message.

## 2020-12-31 NOTE — Telephone Encounter (Signed)
LM VM

## 2020-12-31 NOTE — Telephone Encounter (Signed)
Based on the ingredients you have listed, we would not recommend using this product.  Specifically because it contains Boswellia Serrata - can affect how the liver breaks down clopidogrel.  There is not enough information about that interaction to know if it would cause the clopidogrel to be eliminated faster of build up in the system.  In general with your medications I would suggest that trying a CBD supplement would not cause problems, this particular one could be.  Also, since we don't know what other ingredients are in the product, we really can't advise more than that.

## 2021-01-11 DIAGNOSIS — I350 Nonrheumatic aortic (valve) stenosis: Secondary | ICD-10-CM | POA: Diagnosis not present

## 2021-01-11 DIAGNOSIS — E039 Hypothyroidism, unspecified: Secondary | ICD-10-CM | POA: Diagnosis not present

## 2021-01-11 DIAGNOSIS — I2510 Atherosclerotic heart disease of native coronary artery without angina pectoris: Secondary | ICD-10-CM | POA: Diagnosis not present

## 2021-01-11 DIAGNOSIS — Z0001 Encounter for general adult medical examination with abnormal findings: Secondary | ICD-10-CM | POA: Diagnosis not present

## 2021-01-11 DIAGNOSIS — J45909 Unspecified asthma, uncomplicated: Secondary | ICD-10-CM | POA: Diagnosis not present

## 2021-01-11 DIAGNOSIS — E559 Vitamin D deficiency, unspecified: Secondary | ICD-10-CM | POA: Diagnosis not present

## 2021-01-11 DIAGNOSIS — I10 Essential (primary) hypertension: Secondary | ICD-10-CM | POA: Diagnosis not present

## 2021-01-11 DIAGNOSIS — N400 Enlarged prostate without lower urinary tract symptoms: Secondary | ICD-10-CM | POA: Diagnosis not present

## 2021-01-11 DIAGNOSIS — Z79899 Other long term (current) drug therapy: Secondary | ICD-10-CM | POA: Diagnosis not present

## 2021-01-11 DIAGNOSIS — I700 Atherosclerosis of aorta: Secondary | ICD-10-CM | POA: Diagnosis not present

## 2021-01-11 DIAGNOSIS — M1611 Unilateral primary osteoarthritis, right hip: Secondary | ICD-10-CM | POA: Diagnosis not present

## 2021-01-11 DIAGNOSIS — E785 Hyperlipidemia, unspecified: Secondary | ICD-10-CM | POA: Diagnosis not present

## 2021-01-18 DIAGNOSIS — E785 Hyperlipidemia, unspecified: Secondary | ICD-10-CM | POA: Diagnosis not present

## 2021-01-18 DIAGNOSIS — N400 Enlarged prostate without lower urinary tract symptoms: Secondary | ICD-10-CM | POA: Diagnosis not present

## 2021-01-18 DIAGNOSIS — J45909 Unspecified asthma, uncomplicated: Secondary | ICD-10-CM | POA: Diagnosis not present

## 2021-01-18 DIAGNOSIS — I25119 Atherosclerotic heart disease of native coronary artery with unspecified angina pectoris: Secondary | ICD-10-CM | POA: Diagnosis not present

## 2021-01-18 DIAGNOSIS — E039 Hypothyroidism, unspecified: Secondary | ICD-10-CM | POA: Diagnosis not present

## 2021-01-18 DIAGNOSIS — K219 Gastro-esophageal reflux disease without esophagitis: Secondary | ICD-10-CM | POA: Diagnosis not present

## 2021-01-18 DIAGNOSIS — G459 Transient cerebral ischemic attack, unspecified: Secondary | ICD-10-CM | POA: Diagnosis not present

## 2021-01-18 DIAGNOSIS — I10 Essential (primary) hypertension: Secondary | ICD-10-CM | POA: Diagnosis not present

## 2021-01-18 DIAGNOSIS — M1611 Unilateral primary osteoarthritis, right hip: Secondary | ICD-10-CM | POA: Diagnosis not present

## 2021-02-01 ENCOUNTER — Telehealth: Payer: Self-pay | Admitting: Cardiology

## 2021-02-01 NOTE — Telephone Encounter (Signed)
Will forward pts medication question to our Pharmacist and Dr. Harrell Gave, for further review and advisement.

## 2021-02-01 NOTE — Telephone Encounter (Signed)
Pt c/o medication issue:  1. Name of Medication: FlexJointPlus  2. How are you currently taking this medication (dosage and times per day)? Not currently taking medication   3. Are you having a reaction (difficulty breathing--STAT)? No   4. What is your medication issue? Wanting to know if this is something he can start taking for his arthritis. States it says it can cure 10/10 arthritis pain. Wants to confirm it won't counteract with the blood thinner he is on.

## 2021-02-02 NOTE — Telephone Encounter (Signed)
This product does contain Turmeric Root Powder 300 mg per capsule. Dose is 2 capsules. The concern is that patient is already on DAP with ASA and clopidogrel. Turmeric has anticoagulant properties, therefore this will increase his bleed risk. I would advise against its use.

## 2021-02-02 NOTE — Telephone Encounter (Signed)
Spoke with patient and relayed message from [facility]  He will discuss need for indefinite dual antiplatelet therapy and this supplement with MD at Nov 2022 appt

## 2021-02-25 ENCOUNTER — Telehealth (HOSPITAL_BASED_OUTPATIENT_CLINIC_OR_DEPARTMENT_OTHER): Payer: Self-pay | Admitting: Cardiology

## 2021-02-25 NOTE — Telephone Encounter (Signed)
Unfortunately we do not routinely sign for handicap placards. There are specific qualifications that need to be documented to qualify, which is why typically it is done by primary care. Legally unless there is a cardiac issue (severe heart failure, etc) I cannot sign for it.

## 2021-02-25 NOTE — Telephone Encounter (Signed)
New Message:    He says he needs Dr Harrell Gave to sign for a handicap plaque please.Gary Riggs His primary doctor have retired and his plaque expires today. He needs this asap please.

## 2021-03-03 NOTE — Telephone Encounter (Signed)
  Pt said he said he got his handicap sticker, what he needs now is a referral from Dr. Harrell Gave for a new pcp since Dr. Inda Merlin is going to retire soon, he prefer a male pcp and [place] location

## 2021-03-03 NOTE — Telephone Encounter (Signed)
Left message to call back  

## 2021-03-16 ENCOUNTER — Telehealth (HOSPITAL_BASED_OUTPATIENT_CLINIC_OR_DEPARTMENT_OTHER): Payer: Self-pay | Admitting: Cardiology

## 2021-03-16 ENCOUNTER — Other Ambulatory Visit: Payer: Self-pay | Admitting: Cardiology

## 2021-03-16 ENCOUNTER — Telehealth: Payer: Self-pay | Admitting: Cardiology

## 2021-03-16 NOTE — Telephone Encounter (Signed)
Pt c/o medication issue:  1. Name of Medication: clopidogrel (PLAVIX) 75 MG tablet  2. How are you currently taking this medication (dosage and times per day)? As directed  3. Are you having a reaction (difficulty breathing--STAT)? no  4. What is your medication issue? See phone note form earlier today (03/16/21) pt went to pick up the prescription but was only able to pick up 2/3 of it. He wanted to make Dr. Harrell Gave aware that the pharmacy did not fill the whole prescription.

## 2021-03-16 NOTE — Telephone Encounter (Signed)
Left message for pt to call.

## 2021-03-16 NOTE — Telephone Encounter (Signed)
See info below   And patient only has 3 pills

## 2021-03-16 NOTE — Telephone Encounter (Signed)
Rx(s) sent to pharmacy electronically.  

## 2021-03-16 NOTE — Telephone Encounter (Signed)
*  STAT* If patient is at the pharmacy, call can be transferred to refill team.   1. Which medications need to be refilled? (please list name of each medication and dose if known) clopidogrel (PLAVIX) 75 MG tablet  2. Which pharmacy/location (including street and city if local pharmacy) is medication to be sent to? [place] ([place]), [place] - [address]  3. Do they need a 30 day or 90 day supply? [place]

## 2021-03-16 NOTE — Telephone Encounter (Signed)
Rx was sent to pharmacy-spoke to patient, made aware.

## 2021-03-18 NOTE — Telephone Encounter (Signed)
Called patient regarding Plavix Rx. He states he was given #60 pills because [facility] did not have #90. He is aware to return to the pharmacy for additional medication when needed. He verified his appointment date, location, and time with Dr. Harrell Gave. He thanked me for the call.

## 2021-03-18 NOTE — Telephone Encounter (Signed)
Spoke with patient's son-in-law (listed as patient's cell). He states patient will eventually answer the number we have listed - states he does not know how to retrieve voice mail messages. He advised I call back to the home number we have listed (which is pt's cell) until patient answers.

## 2021-03-23 NOTE — Telephone Encounter (Signed)
Pt is returning call from 03/18/21, he is not sure what the call is regarding. Please advise pt further

## 2021-03-29 ENCOUNTER — Other Ambulatory Visit: Payer: Self-pay | Admitting: Cardiology

## 2021-03-29 DIAGNOSIS — I2510 Atherosclerotic heart disease of native coronary artery without angina pectoris: Secondary | ICD-10-CM

## 2021-03-29 DIAGNOSIS — E7800 Pure hypercholesterolemia, unspecified: Secondary | ICD-10-CM

## 2021-03-29 NOTE — Telephone Encounter (Signed)
I do not know that the primary care doctors have completely moved into the space, but Dr. [place] is listed on the website. He can call [phone] to see if they are open to new patients. I am predominantly working in the hospital the next few weeks but can ask around for an update when I return. Thanks.

## 2021-03-29 NOTE — Telephone Encounter (Signed)
Lm to call back ./cy 

## 2021-03-29 NOTE — Telephone Encounter (Signed)
*  STAT* If patient is at the pharmacy, call can be transferred to refill team.   1. Which medications need to be refilled? (please list name of each medication and dose if known) rosuvastatin (CRESTOR) 20 MG tablet  2. Which pharmacy/location (including street and city if local pharmacy) is medication to be sent to? [place], [place]  3. Do they need a 30 day or 90 day supply? 90 day

## 2021-04-02 NOTE — Telephone Encounter (Signed)
Pt updated and verbalized understanding. PCP name and number sent via mychart as requested by pt.

## 2021-04-23 ENCOUNTER — Emergency Department (HOSPITAL_COMMUNITY): Payer: Medicare HMO

## 2021-04-23 ENCOUNTER — Encounter (HOSPITAL_COMMUNITY): Payer: Self-pay

## 2021-04-23 ENCOUNTER — Inpatient Hospital Stay (HOSPITAL_COMMUNITY): Payer: Medicare HMO

## 2021-04-23 ENCOUNTER — Ambulatory Visit (HOSPITAL_BASED_OUTPATIENT_CLINIC_OR_DEPARTMENT_OTHER): Payer: Medicare HMO | Admitting: Cardiology

## 2021-04-23 ENCOUNTER — Other Ambulatory Visit: Payer: Self-pay

## 2021-04-23 ENCOUNTER — Inpatient Hospital Stay (HOSPITAL_COMMUNITY)
Admission: EM | Admit: 2021-04-23 | Discharge: 2021-05-06 | DRG: 871 | Disposition: E | Payer: Medicare HMO | Attending: Internal Medicine | Admitting: Internal Medicine

## 2021-04-23 DIAGNOSIS — I639 Cerebral infarction, unspecified: Secondary | ICD-10-CM

## 2021-04-23 DIAGNOSIS — Z79890 Hormone replacement therapy (postmenopausal): Secondary | ICD-10-CM

## 2021-04-23 DIAGNOSIS — G8929 Other chronic pain: Secondary | ICD-10-CM | POA: Diagnosis not present

## 2021-04-23 DIAGNOSIS — K7200 Acute and subacute hepatic failure without coma: Secondary | ICD-10-CM | POA: Diagnosis not present

## 2021-04-23 DIAGNOSIS — Z515 Encounter for palliative care: Secondary | ICD-10-CM

## 2021-04-23 DIAGNOSIS — R531 Weakness: Secondary | ICD-10-CM | POA: Diagnosis not present

## 2021-04-23 DIAGNOSIS — Z7902 Long term (current) use of antithrombotics/antiplatelets: Secondary | ICD-10-CM

## 2021-04-23 DIAGNOSIS — N179 Acute kidney failure, unspecified: Secondary | ICD-10-CM | POA: Diagnosis not present

## 2021-04-23 DIAGNOSIS — M25551 Pain in right hip: Secondary | ICD-10-CM

## 2021-04-23 DIAGNOSIS — E559 Vitamin D deficiency, unspecified: Secondary | ICD-10-CM | POA: Diagnosis present

## 2021-04-23 DIAGNOSIS — R778 Other specified abnormalities of plasma proteins: Secondary | ICD-10-CM

## 2021-04-23 DIAGNOSIS — M7989 Other specified soft tissue disorders: Secondary | ICD-10-CM

## 2021-04-23 DIAGNOSIS — W19XXXA Unspecified fall, initial encounter: Secondary | ICD-10-CM | POA: Diagnosis not present

## 2021-04-23 DIAGNOSIS — R6521 Severe sepsis with septic shock: Secondary | ICD-10-CM | POA: Diagnosis present

## 2021-04-23 DIAGNOSIS — E871 Hypo-osmolality and hyponatremia: Secondary | ICD-10-CM | POA: Diagnosis present

## 2021-04-23 DIAGNOSIS — Z66 Do not resuscitate: Secondary | ICD-10-CM | POA: Diagnosis not present

## 2021-04-23 DIAGNOSIS — E8721 Acute metabolic acidosis: Secondary | ICD-10-CM | POA: Diagnosis present

## 2021-04-23 DIAGNOSIS — I10 Essential (primary) hypertension: Secondary | ICD-10-CM | POA: Diagnosis present

## 2021-04-23 DIAGNOSIS — Z978 Presence of other specified devices: Secondary | ICD-10-CM

## 2021-04-23 DIAGNOSIS — I471 Supraventricular tachycardia: Secondary | ICD-10-CM | POA: Diagnosis not present

## 2021-04-23 DIAGNOSIS — J80 Acute respiratory distress syndrome: Secondary | ICD-10-CM | POA: Diagnosis present

## 2021-04-23 DIAGNOSIS — R52 Pain, unspecified: Secondary | ICD-10-CM

## 2021-04-23 DIAGNOSIS — I2510 Atherosclerotic heart disease of native coronary artery without angina pectoris: Secondary | ICD-10-CM | POA: Diagnosis present

## 2021-04-23 DIAGNOSIS — E039 Hypothyroidism, unspecified: Secondary | ICD-10-CM | POA: Diagnosis present

## 2021-04-23 DIAGNOSIS — E872 Acidosis, unspecified: Secondary | ICD-10-CM | POA: Diagnosis not present

## 2021-04-23 DIAGNOSIS — Z20822 Contact with and (suspected) exposure to covid-19: Secondary | ICD-10-CM | POA: Diagnosis present

## 2021-04-23 DIAGNOSIS — I6782 Cerebral ischemia: Secondary | ICD-10-CM | POA: Diagnosis not present

## 2021-04-23 DIAGNOSIS — R570 Cardiogenic shock: Secondary | ICD-10-CM | POA: Diagnosis not present

## 2021-04-23 DIAGNOSIS — I350 Nonrheumatic aortic (valve) stenosis: Secondary | ICD-10-CM | POA: Diagnosis present

## 2021-04-23 DIAGNOSIS — Z955 Presence of coronary angioplasty implant and graft: Secondary | ICD-10-CM

## 2021-04-23 DIAGNOSIS — M6282 Rhabdomyolysis: Secondary | ICD-10-CM | POA: Diagnosis not present

## 2021-04-23 DIAGNOSIS — Z452 Encounter for adjustment and management of vascular access device: Secondary | ICD-10-CM

## 2021-04-23 DIAGNOSIS — Z8679 Personal history of other diseases of the circulatory system: Secondary | ICD-10-CM | POA: Diagnosis not present

## 2021-04-23 DIAGNOSIS — N401 Enlarged prostate with lower urinary tract symptoms: Secondary | ICD-10-CM | POA: Diagnosis present

## 2021-04-23 DIAGNOSIS — M25851 Other specified joint disorders, right hip: Secondary | ICD-10-CM | POA: Diagnosis not present

## 2021-04-23 DIAGNOSIS — I214 Non-ST elevation (NSTEMI) myocardial infarction: Secondary | ICD-10-CM

## 2021-04-23 DIAGNOSIS — M25451 Effusion, right hip: Secondary | ICD-10-CM | POA: Diagnosis not present

## 2021-04-23 DIAGNOSIS — E785 Hyperlipidemia, unspecified: Secondary | ICD-10-CM | POA: Diagnosis not present

## 2021-04-23 DIAGNOSIS — E7800 Pure hypercholesterolemia, unspecified: Secondary | ICD-10-CM | POA: Diagnosis present

## 2021-04-23 DIAGNOSIS — Z4659 Encounter for fitting and adjustment of other gastrointestinal appliance and device: Secondary | ICD-10-CM

## 2021-04-23 DIAGNOSIS — I82A11 Acute embolism and thrombosis of right axillary vein: Secondary | ICD-10-CM | POA: Diagnosis not present

## 2021-04-23 DIAGNOSIS — Z7982 Long term (current) use of aspirin: Secondary | ICD-10-CM

## 2021-04-23 DIAGNOSIS — R9431 Abnormal electrocardiogram [ECG] [EKG]: Secondary | ICD-10-CM | POA: Diagnosis not present

## 2021-04-23 DIAGNOSIS — A419 Sepsis, unspecified organism: Secondary | ICD-10-CM | POA: Diagnosis present

## 2021-04-23 DIAGNOSIS — I248 Other forms of acute ischemic heart disease: Secondary | ICD-10-CM | POA: Diagnosis not present

## 2021-04-23 DIAGNOSIS — J9601 Acute respiratory failure with hypoxia: Secondary | ICD-10-CM | POA: Diagnosis present

## 2021-04-23 DIAGNOSIS — T796XXA Traumatic ischemia of muscle, initial encounter: Secondary | ICD-10-CM | POA: Diagnosis not present

## 2021-04-23 DIAGNOSIS — Z4682 Encounter for fitting and adjustment of non-vascular catheter: Secondary | ICD-10-CM | POA: Diagnosis not present

## 2021-04-23 DIAGNOSIS — A409 Streptococcal sepsis, unspecified: Principal | ICD-10-CM | POA: Diagnosis present

## 2021-04-23 DIAGNOSIS — R739 Hyperglycemia, unspecified: Secondary | ICD-10-CM | POA: Diagnosis present

## 2021-04-23 DIAGNOSIS — J9811 Atelectasis: Secondary | ICD-10-CM | POA: Diagnosis not present

## 2021-04-23 DIAGNOSIS — J690 Pneumonitis due to inhalation of food and vomit: Secondary | ICD-10-CM | POA: Diagnosis not present

## 2021-04-23 DIAGNOSIS — L0390 Cellulitis, unspecified: Secondary | ICD-10-CM

## 2021-04-23 DIAGNOSIS — M79604 Pain in right leg: Secondary | ICD-10-CM | POA: Diagnosis not present

## 2021-04-23 DIAGNOSIS — R918 Other nonspecific abnormal finding of lung field: Secondary | ICD-10-CM | POA: Diagnosis not present

## 2021-04-23 DIAGNOSIS — R0682 Tachypnea, not elsewhere classified: Secondary | ICD-10-CM | POA: Diagnosis not present

## 2021-04-23 DIAGNOSIS — G9341 Metabolic encephalopathy: Secondary | ICD-10-CM

## 2021-04-23 DIAGNOSIS — E875 Hyperkalemia: Secondary | ICD-10-CM | POA: Diagnosis not present

## 2021-04-23 DIAGNOSIS — J189 Pneumonia, unspecified organism: Secondary | ICD-10-CM | POA: Diagnosis not present

## 2021-04-23 DIAGNOSIS — R7989 Other specified abnormal findings of blood chemistry: Secondary | ICD-10-CM | POA: Diagnosis not present

## 2021-04-23 DIAGNOSIS — L539 Erythematous condition, unspecified: Secondary | ICD-10-CM | POA: Diagnosis not present

## 2021-04-23 DIAGNOSIS — J90 Pleural effusion, not elsewhere classified: Secondary | ICD-10-CM | POA: Diagnosis not present

## 2021-04-23 DIAGNOSIS — L03115 Cellulitis of right lower limb: Secondary | ICD-10-CM | POA: Diagnosis present

## 2021-04-23 DIAGNOSIS — R4182 Altered mental status, unspecified: Secondary | ICD-10-CM | POA: Diagnosis not present

## 2021-04-23 DIAGNOSIS — G319 Degenerative disease of nervous system, unspecified: Secondary | ICD-10-CM | POA: Diagnosis not present

## 2021-04-23 DIAGNOSIS — M1611 Unilateral primary osteoarthritis, right hip: Secondary | ICD-10-CM | POA: Diagnosis not present

## 2021-04-23 DIAGNOSIS — R0902 Hypoxemia: Secondary | ICD-10-CM | POA: Diagnosis not present

## 2021-04-23 DIAGNOSIS — Z79899 Other long term (current) drug therapy: Secondary | ICD-10-CM

## 2021-04-23 LAB — COMPREHENSIVE METABOLIC PANEL
ALT: 38 U/L (ref 0–44)
AST: 128 U/L — ABNORMAL HIGH (ref 15–41)
Albumin: 3.9 g/dL (ref 3.5–5.0)
Alkaline Phosphatase: 50 U/L (ref 38–126)
Anion gap: 11 (ref 5–15)
BUN: 25 mg/dL — ABNORMAL HIGH (ref 8–23)
CO2: 23 mmol/L (ref 22–32)
Calcium: 9.2 mg/dL (ref 8.9–10.3)
Chloride: 97 mmol/L — ABNORMAL LOW (ref 98–111)
Creatinine, Ser: 1.05 mg/dL (ref 0.61–1.24)
GFR, Estimated: >60 mL/min (ref >60)
Glucose, Bld: 113 mg/dL — ABNORMAL HIGH (ref 70–99)
Potassium: 3.7 mmol/L (ref 3.5–5.1)
Sodium: 131 mmol/L — ABNORMAL LOW (ref 135–145)
Total Bilirubin: 1.1 mg/dL (ref 0.3–1.2)
Total Protein: 7.5 g/dL (ref 6.5–8.1)

## 2021-04-23 LAB — CBC WITH DIFFERENTIAL/PLATELET
Abs Immature Granulocytes: 0.22 10*3/uL — ABNORMAL HIGH (ref 0.00–0.07)
Basophils Absolute: 0.1 10*3/uL (ref 0.0–0.1)
Basophils Relative: 0 %
Eosinophils Absolute: 0 10*3/uL (ref 0.0–0.5)
Eosinophils Relative: 0 %
HCT: 43.8 % (ref 39.0–52.0)
Hemoglobin: 14.2 g/dL (ref 13.0–17.0)
Immature Granulocytes: 1 %
Lymphocytes Relative: 3 %
Lymphs Abs: 0.7 10*3/uL (ref 0.7–4.0)
MCH: 31.8 pg (ref 26.0–34.0)
MCHC: 32.4 g/dL (ref 30.0–36.0)
MCV: 98.2 fL (ref 80.0–100.0)
Monocytes Absolute: 1.5 10*3/uL — ABNORMAL HIGH (ref 0.1–1.0)
Monocytes Relative: 5 %
Neutro Abs: 25.6 10*3/uL — ABNORMAL HIGH (ref 1.7–7.7)
Neutrophils Relative %: 91 %
Platelets: 236 10*3/uL (ref 150–400)
RBC: 4.46 MIL/uL (ref 4.22–5.81)
RDW: 13.1 % (ref 11.5–15.5)
WBC: 28.1 10*3/uL — ABNORMAL HIGH (ref 4.0–10.5)
nRBC: 0 % (ref 0.0–0.2)

## 2021-04-23 LAB — POCT I-STAT 7, (LYTES, BLD GAS, ICA,H+H)
Acid-base deficit: 6 mmol/L — ABNORMAL HIGH (ref 0.0–2.0)
Bicarbonate: 17.4 mmol/L — ABNORMAL LOW (ref 20.0–28.0)
Calcium, Ion: 1.11 mmol/L — ABNORMAL LOW (ref 1.15–1.40)
HCT: 39 % (ref 39.0–52.0)
Hemoglobin: 13.3 g/dL (ref 13.0–17.0)
O2 Saturation: 78 %
Patient temperature: 97.9
Potassium: 4.1 mmol/L (ref 3.5–5.1)
Sodium: 130 mmol/L — ABNORMAL LOW (ref 135–145)
TCO2: 18 mmol/L — ABNORMAL LOW (ref 22–32)
pCO2 arterial: 27.3 mmHg — ABNORMAL LOW (ref 32.0–48.0)
pH, Arterial: 7.411 (ref 7.350–7.450)
pO2, Arterial: 40 mmHg — CL (ref 83.0–108.0)

## 2021-04-23 LAB — RESP PANEL BY RT-PCR (FLU A&B, COVID) ARPGX2
Influenza A by PCR: NEGATIVE
Influenza B by PCR: NEGATIVE
SARS Coronavirus 2 by RT PCR: NEGATIVE

## 2021-04-23 LAB — COOXEMETRY PANEL
Carboxyhemoglobin: 0.7 % (ref 0.5–1.5)
Methemoglobin: 0.9 % (ref 0.0–1.5)
O2 Saturation: 25.7 %
Total hemoglobin: 14.2 g/dL (ref 12.0–16.0)

## 2021-04-23 LAB — CK: Total CK: 1733 U/L — ABNORMAL HIGH (ref 49–397)

## 2021-04-23 LAB — TROPONIN I (HIGH SENSITIVITY)
Troponin I (High Sensitivity): 4631 ng/L (ref <18)
Troponin I (High Sensitivity): 2574 ng/L (ref <18)

## 2021-04-23 LAB — GLUCOSE, CAPILLARY
Glucose-Capillary: 148 mg/dL — ABNORMAL HIGH (ref 70–99)
Glucose-Capillary: 193 mg/dL — ABNORMAL HIGH (ref 70–99)

## 2021-04-23 LAB — PROTIME-INR
INR: 1.2 (ref 0.8–1.2)
Prothrombin Time: 15.6 seconds — ABNORMAL HIGH (ref 11.4–15.2)

## 2021-04-23 LAB — LACTIC ACID, PLASMA
Lactic Acid, Venous: 2.6 mmol/L (ref 0.5–1.9)
Lactic Acid, Venous: 6.6 mmol/L (ref 0.5–1.9)

## 2021-04-23 LAB — APTT: aPTT: 30 seconds (ref 24–36)

## 2021-04-23 MED ORDER — FENTANYL 2500MCG IN NS 250ML (10MCG/ML) PREMIX INFUSION
25.0000 ug/h | INTRAVENOUS | Status: DC
Start: 2021-04-23 — End: 2021-04-23
  Filled 2021-04-23: qty 250

## 2021-04-23 MED ORDER — LABETALOL HCL 5 MG/ML IV SOLN
5.0000 mg | Freq: Once | INTRAVENOUS | Status: AC
Start: 2021-04-23 — End: 2021-04-23

## 2021-04-23 MED ORDER — IOHEXOL 300 MG/ML  SOLN
100.0000 mL | Freq: Once | INTRAMUSCULAR | Status: AC | PRN
  Administered 2021-04-23: 100 mL via INTRAVENOUS

## 2021-04-23 MED ORDER — SODIUM CHLORIDE 0.9 % IV SOLN
2.0000 g | Freq: Once | INTRAVENOUS | Status: AC
  Administered 2021-04-23: 2 g via INTRAVENOUS
  Filled 2021-04-23: qty 20

## 2021-04-23 MED ORDER — NOREPINEPHRINE 16 MG/250ML-% IV SOLN
0.0000 ug/min | INTRAVENOUS | Status: DC
  Administered 2021-04-24 – 2021-04-25 (×5): 40 ug/min via INTRAVENOUS
  Filled 2021-04-23 (×6): qty 250

## 2021-04-23 MED ORDER — HEPARIN BOLUS VIA INFUSION
4000.0000 [IU] | Freq: Once | INTRAVENOUS | Status: AC
  Administered 2021-04-23: 4000 [IU] via INTRAVENOUS
  Filled 2021-04-23: qty 4000

## 2021-04-23 MED ORDER — DOCUSATE SODIUM 50 MG/5ML PO LIQD: 100.0000 mg | Freq: Two times a day (BID) | ORAL | Status: DC | PRN

## 2021-04-23 MED ORDER — EPINEPHRINE HCL 5 MG/250ML IV SOLN IN NS
0.5000 ug/min | INTRAVENOUS | Status: DC
Start: 2021-04-23 — End: 2021-04-25
  Administered 2021-04-23: 0.5 ug/min via INTRAVENOUS
  Administered 2021-04-24: 11 ug/min via INTRAVENOUS
  Administered 2021-04-24: 4 ug/min via INTRAVENOUS
  Administered 2021-04-25: 8 ug/min via INTRAVENOUS
  Filled 2021-04-23 (×4): qty 250

## 2021-04-23 MED ORDER — ETOMIDATE 2 MG/ML IV SOLN
20.0000 mg | Freq: Once | INTRAVENOUS | Status: AC
  Administered 2021-04-23: 20 mg via INTRAVENOUS

## 2021-04-23 MED ORDER — FENTANYL CITRATE PF 50 MCG/ML IJ SOSY: 25.0000 ug | PREFILLED_SYRINGE | INTRAMUSCULAR | Status: DC | PRN

## 2021-04-23 MED ORDER — FENTANYL 2500MCG IN NS 250ML (10MCG/ML) PREMIX INFUSION
0.0000 ug/h | INTRAVENOUS | Status: DC
  Administered 2021-04-23: 50 ug/h via INTRAVENOUS
  Administered 2021-04-24 – 2021-04-25 (×2): 100 ug/h via INTRAVENOUS
  Filled 2021-04-23 (×2): qty 250

## 2021-04-23 MED ORDER — CHLORHEXIDINE GLUCONATE CLOTH 2 % EX PADS
6.0000 | MEDICATED_PAD | Freq: Every day | CUTANEOUS | Status: DC
  Administered 2021-04-23: 6 via TOPICAL

## 2021-04-23 MED ORDER — LACTATED RINGERS IV BOLUS
1000.0000 mL | Freq: Once | INTRAVENOUS | Status: AC
  Administered 2021-04-23: 1000 mL via INTRAVENOUS

## 2021-04-23 MED ORDER — VANCOMYCIN HCL 1500 MG/300ML IV SOLN: 1500.0000 mg | INTRAVENOUS | Status: DC

## 2021-04-23 MED ORDER — SODIUM CHLORIDE 0.9 % IV SOLN
3.0000 g | Freq: Three times a day (TID) | INTRAVENOUS | Status: DC
  Administered 2021-04-23 – 2021-04-24 (×3): 3 g via INTRAVENOUS
  Filled 2021-04-23 (×4): qty 8

## 2021-04-23 MED ORDER — FENTANYL BOLUS VIA INFUSION
25.0000 ug | INTRAVENOUS | Status: DC | PRN
  Filled 2021-04-23: qty 25

## 2021-04-23 MED ORDER — NOREPINEPHRINE 4 MG/250ML-% IV SOLN
0.0000 ug/min | INTRAVENOUS | Status: DC
  Administered 2021-04-23: 40 ug/min via INTRAVENOUS
  Filled 2021-04-23: qty 500

## 2021-04-23 MED ORDER — NOREPINEPHRINE 4 MG/250ML-% IV SOLN
2.0000 ug/min | INTRAVENOUS | Status: DC
  Administered 2021-04-23: 2 ug/min via INTRAVENOUS

## 2021-04-23 MED ORDER — PANTOPRAZOLE SODIUM 40 MG IV SOLR
40.0000 mg | Freq: Every day | INTRAVENOUS | Status: DC
  Administered 2021-04-23 – 2021-04-24 (×2): 40 mg via INTRAVENOUS
  Filled 2021-04-23 (×2): qty 40

## 2021-04-23 MED ORDER — HEPARIN (PORCINE) 25000 UT/250ML-% IV SOLN
1250.0000 [IU]/h | INTRAVENOUS | Status: DC
  Administered 2021-04-23 – 2021-04-24 (×2): 1000 [IU]/h via INTRAVENOUS
  Administered 2021-04-24 – 2021-04-25 (×2): 1250 [IU]/h via INTRAVENOUS
  Filled 2021-04-23 (×3): qty 250

## 2021-04-23 MED ORDER — SODIUM CHLORIDE 0.9 % IV SOLN: 250.0000 mL | INTRAVENOUS | Status: DC

## 2021-04-23 MED ORDER — VANCOMYCIN HCL 1750 MG/350ML IV SOLN
1750.0000 mg | Freq: Once | INTRAVENOUS | Status: DC
  Filled 2021-04-23: qty 350

## 2021-04-23 MED ORDER — NOREPINEPHRINE 4 MG/250ML-% IV SOLN
2.0000 ug/min | INTRAVENOUS | Status: DC
  Administered 2021-04-23: 5 ug/min via INTRAVENOUS
  Administered 2021-04-23: 25 ug/min via INTRAVENOUS
  Administered 2021-04-23: 20 ug/min via INTRAVENOUS
  Filled 2021-04-23: qty 250

## 2021-04-23 MED ORDER — VASOPRESSIN 20 UNITS/100 ML INFUSION FOR SHOCK
0.0000 [IU]/min | INTRAVENOUS | Status: DC
Start: 2021-04-23 — End: 2021-04-25
  Administered 2021-04-23 – 2021-04-25 (×4): 0.03 [IU]/min via INTRAVENOUS
  Filled 2021-04-23 (×4): qty 100

## 2021-04-23 MED ORDER — LACTATED RINGERS IV SOLN: INTRAVENOUS | Status: AC

## 2021-04-23 MED ORDER — VANCOMYCIN HCL 1500 MG/300ML IV SOLN
1500.0000 mg | INTRAVENOUS | Status: DC
Start: 2021-04-24 — End: 2021-04-23

## 2021-04-23 MED ORDER — LACTATED RINGERS IV BOLUS
500.0000 mL | Freq: Once | INTRAVENOUS | Status: AC
  Administered 2021-04-23: 500 mL via INTRAVENOUS

## 2021-04-23 MED ORDER — FUROSEMIDE 10 MG/ML IJ SOLN: 40.0000 mg | Freq: Once | INTRAMUSCULAR | Status: AC

## 2021-04-23 MED ORDER — DEXMEDETOMIDINE HCL IN NACL 400 MCG/100ML IV SOLN
0.4000 ug/kg/h | INTRAVENOUS | Status: DC
  Administered 2021-04-23 – 2021-04-24 (×2): 0.4 ug/kg/h via INTRAVENOUS
  Administered 2021-04-24: 0.43 ug/kg/h via INTRAVENOUS
  Administered 2021-04-25: 0.5 ug/kg/h via INTRAVENOUS
  Filled 2021-04-23 (×3): qty 100

## 2021-04-23 MED ORDER — VANCOMYCIN HCL 1750 MG/350ML IV SOLN
1750.0000 mg | Freq: Once | INTRAVENOUS | Status: AC
  Administered 2021-04-23: 1750 mg via INTRAVENOUS
  Filled 2021-04-23: qty 350

## 2021-04-23 MED ORDER — FENTANYL CITRATE PF 50 MCG/ML IJ SOSY: 50.0000 ug | PREFILLED_SYRINGE | Freq: Once | INTRAMUSCULAR | Status: DC

## 2021-04-23 MED ORDER — LABETALOL HCL 5 MG/ML IV SOLN
INTRAVENOUS | Status: AC
  Filled 2021-04-23: qty 4

## 2021-04-23 MED ORDER — FUROSEMIDE 10 MG/ML IJ SOLN
INTRAMUSCULAR | Status: AC
  Administered 2021-04-23: 40 mg via INTRAVENOUS
  Filled 2021-04-23: qty 4

## 2021-04-23 MED ORDER — ROCURONIUM BROMIDE 50 MG/5ML IV SOLN
100.0000 mg | Freq: Once | INTRAVENOUS | Status: AC
  Administered 2021-04-23: 100 mg via INTRAVENOUS

## 2021-04-23 MED ORDER — PROPOFOL 1000 MG/100ML IV EMUL
0.0000 ug/kg/min | INTRAVENOUS | Status: DC
Start: 2021-04-23 — End: 2021-04-23

## 2021-04-23 MED ORDER — FENTANYL CITRATE PF 50 MCG/ML IJ SOSY
PREFILLED_SYRINGE | INTRAMUSCULAR | Status: AC
  Filled 2021-04-23: qty 2

## 2021-04-23 MED ORDER — LACTATED RINGERS IV BOLUS: 1000.0000 mL | Freq: Once | INTRAVENOUS | Status: DC

## 2021-04-23 MED ORDER — POLYETHYLENE GLYCOL 3350 17 G PO PACK: 17.0000 g | PACK | Freq: Every day | ORAL | Status: DC | PRN

## 2021-04-23 NOTE — ED Notes (Signed)
ED Provider at bedside. 

## 2021-04-23 NOTE — ED Notes (Signed)
Intubated with 8 ETT 24 @ lip

## 2021-04-23 NOTE — ED Notes (Signed)
ICU providers at bedside preparing for intubation

## 2021-04-23 NOTE — Progress Notes (Incomplete)
Cardiology Office Note:    Date:  04/12/2021   ID:  Gary Riggs, DOB [birth year], MRN [id number]  PCP:  Josetta Huddle, MD  Cardiologist:  Buford Dresser, MD  Referring MD: Josetta Huddle, MD   CC: follow up  History of Present Illness:    Gary Riggs is a 87 y.o. male with a hx of severe multivessel CAD s/p PCI, hypertension, hypothyroidism s/p radioactive iodine for Graves disease, BPH who is seen for follow up today. I initially saw him 03/24/20 as a new consult at the request of Josetta Huddle, MD for the evaluation and management of abnormal stress test.  Cardiac history: Noted syncope 03/01/20 in church. Also noted chest tightness the week prior at the [facility] football game, as well as a pre-syncopal event while showering. Referred for stress test. I spoke to Mr. Noseworthy after his stress test 10/13. Stress test was significantly abnormal. Underwent cath as below, declined CABG and had PCI 03/2020.  Today: For exercise, he uses a small under-desk bike pedals. Prior to his previous testing, he tried to jog in place at home, and did not notice significant exertional symptoms. He is wondering if he should try small jogs such as to the mailbox and back.  Currently he is in physical therapy. He is unable to straighten his right leg without the use of a cane. At this time he has underwent the second injection. Fluid was found and he has been told he may have Gout.   Today:   He denies any palpitations, chest pain, or shortness of breath. No lightheadedness, headaches, syncope, orthopnea, PND, lower extremity edema or exertional symptoms.  (+)   Past Medical History:  Diagnosis Date   Coronary artery disease    Diverticulosis    DJD (degenerative joint disease)    ED (erectile dysfunction)    Hemorrhoids    Hiatal hernia    Hypertension    Onychomycosis    Rosacea    Thyroid disease     Past Surgical History:  Procedure Laterality Date   CORONARY STENT  INTERVENTION N/A 03/31/2020   Procedure: CORONARY STENT INTERVENTION;  Surgeon: Jettie Booze, MD;  Location: [place] [facility];  Service: Cardiovascular;  Laterality: N/A;   INTRAVASCULAR ULTRASOUND/IVUS N/A 03/31/2020   Procedure: Intravascular Ultrasound/IVUS;  Surgeon: Jettie Booze, MD;  Location: [place] [facility];  Service: Cardiovascular;  Laterality: N/A;   LEFT HEART CATH N/A 03/31/2020   Procedure: Left Heart Cath;  Surgeon: Jettie Booze, MD;  Location: [place] [facility];  Service: Cardiovascular;  Laterality: N/A;   LEFT HEART CATH AND CORONARY ANGIOGRAPHY N/A 03/30/2020   Procedure: LEFT HEART CATH AND CORONARY ANGIOGRAPHY;  Surgeon: Nelva Bush, MD;  Location: [place] [facility];  Service: Cardiovascular;  Laterality: N/A;    Current Medications: Current Outpatient Medications on File Prior to Visit  Medication Sig   Ascorbic Acid (VITAMIN C) 1000 MG tablet Take 1,000 mg by mouth daily.   aspirin 81 MG chewable tablet Chew 81 mg by mouth daily.    Cholecalciferol (VITAMIN D-3) 1000 UNITS CAPS Take 1,000 Units by mouth daily.    clopidogrel (PLAVIX) 75 MG tablet Take 1 tablet by mouth once daily with breakfast   Cyanocobalamin (VITAMIN B-12 PO) Take 2,500 mcg by mouth daily.    GLUCOS-CHONDROIT-MSM-C-HYAL PO Take 1 tablet by mouth daily. 1500/1200   levothyroxine (SYNTHROID) 137 MCG tablet Take 137 mcg by mouth daily before breakfast.    losartan-hydrochlorothiazide (HYZAAR) 100-12.5  MG per tablet Take 1 tablet by mouth daily.   Multiple Minerals-Vitamins (CALCIUM CITRATE-MAG-MINERALS PO) Take 1 tablet by mouth daily. Vit D3/ Zinc   Multiple Vitamin (MULTIVITAMIN) capsule Take 1 capsule by mouth daily.   nitroGLYCERIN (NITROSTAT) 0.4 MG SL tablet Place 0.4 mg under the tongue every 5 (five) minutes as needed for chest pain.   OVER THE COUNTER MEDICATION Take 1 capsule by mouth daily. [employer]   RESVERATROL PO Take 100 mg by mouth daily. Omega Q  plus   rosuvastatin (CRESTOR) 20 MG tablet TAKE 1 TABLET EVERY DAY (REPLACES SIMVASTATIN)   tamsulosin (FLOMAX) 0.4 MG CAPS capsule Take 0.4 mg by mouth at bedtime.    No current facility-administered medications on file prior to visit.     Allergies:   Aleve [naproxen sodium], Tussionex pennkinetic er [employer] polst-cpm polst er], and Synthroid [levothyroxine sodium]   Social History   Tobacco Use   Smoking status: Never   Smokeless tobacco: Never  Vaping Use   Vaping Use: Never used  Substance Use Topics   Alcohol use: No   Drug use: No    Family History: Father had Alzheimer's, mother deceased of unknown causes. Sister with diabetes.  ROS:   Please see the history of present illness.    Additional pertinent ROS otherwise unremarkable.  EKGs/Labs/Other Studies Reviewed:    The following studies were reviewed today:  PCI 03/31/20 Ost Cx to Prox Cx lesion is 90% stenosed. Mid Cx lesion is 95% stenosed. Mid RCA lesion is 80% stenosed. A drug-eluting stent was successfully placed using a STENT RESOLUTE ONYX 3.5X15, postdilated to 3.9 mm and optimized with IVUS. Post intervention, there is a 0% residual stenosis. Prox LAD to Mid LAD lesion is 90% stenosed. A drug-eluting stent was successfully placed using a [place] [id number], postdilated to 3.3 mm and optimized with IVUS. Post intervention, there is a 0% residual stenosis. LV end diastolic pressure is normal. There is mild aortic valve stenosis.   Continue dual antiplatelet therapy along with aggressive secondary prevention.  Consider clopidogrel monotherapy after DAPT.     Medical management of complex circumflex disease.   Cath 03/30/20 Conclusions: Severe three-vessel coronary artery disease, including 80-95% proximal/mid LAD, ostial and mid LCx, and mid RCA stenoses.  The proximal/mid LAD and ostial LCx lesions are heavily calcified. Normal left ventricular systolic function and filling  pressure. Possible mild aortic stenosis.   Recommendations: Given unexplained syncope and recent high-risk stress test, we will admit Mr. Swanger for cardiac surgery consultation for CABG.  If he is not a reasonable candidate for surgery or Mr. Vasco declines CABG, multivessel PCI could be considered before discharge (this would likely require atherectomy of the LAD and ostial LCx). Obtain echocardiogram. Aggressive secondary prevention.  Echo 03/30/20 1. Left ventricular ejection fraction, by estimation, is 55 to 60%. The  left ventricle has normal function. The left ventricle has no regional  wall motion abnormalities. There is severe concentric left ventricular  hypertrophy. Left ventricular diastolic   parameters are consistent with Grade II diastolic dysfunction  (pseudonormalization).   2. Right ventricular systolic function is normal. The right ventricular  size is normal. There is mildly elevated pulmonary artery systolic  pressure.   3. Left atrial size was moderately dilated.   4. Right atrial size was mildly dilated.   5. Eccentric MR with horizontal splay; likely underestimation of  regurgitation. The mitral valve is grossly normal. Mild mitral valve  regurgitation.   6. The aortic valve  is tricuspid. There is moderate calcification of the  aortic valve. Aortic valve regurgitation is not visualized. Mild aortic  valve sclerosis is present, with no evidence of aortic valve stenosis.  Aortic valve area, by VTI measures  1.78 cm. Aortic valve mean gradient measures 8.3 mmHg. Aortic valve Vmax  measures 1.91 m/s.   7. There is borderline dilatation of the ascending aorta, measuring 39  mm.   8. The inferior vena cava is normal in size with greater than 50%  respiratory variability, suggesting right atrial pressure of 3 mmHg.   ETT 03/18/20 Poor exercise capacity, achieved 4.6 METS Horizontal ST segment depression was noted during stress in the III, aVF, II, V4, V5 and V6  leads. EKG changes concerning for ischemia. Recommend referral to cardiology for further evaluation  I personally reviewed the strips as DOD that day; they are not all saved in the system. Had significant ventricular ectopy, including PVC triplet.  EKG:  EKG is personally reviewed.   04/12/2021: *** 10/22/2020: no ECG performed 04/01/20: sinus rhythm with 1st degree AV block at 64 bpm  Recent Labs: No results found for requested labs within last 8760 hours.   Recent Lipid Panel    Component Value Date/Time   CHOL 165 03/31/2020 0138   TRIG 82 03/31/2020 0138   HDL 45 03/31/2020 0138   CHOLHDL 3.7 03/31/2020 0138   VLDL 16 03/31/2020 0138   LDLCALC 104 (H) 03/31/2020 0138    Physical Exam:    VS:  There were no vitals taken for this visit.    Wt Readings from Last 3 Encounters:  10/22/20 189 lb (85.7 kg)  07/24/20 194 lb 3.2 oz (88.1 kg)  04/08/20 191 lb 12.8 oz (87 kg)    GEN: Well nourished, well developed in no acute distress HEENT: Normal, moist mucous membranes NECK: No JVD CARDIAC: regular rhythm, occasional dropped beat***, normal S1 and S2, no rubs or gallops. 2/6 systolic ejection murmur***. VASCULAR: Radial and DP pulses 2+ bilaterally. No carotid bruits RESPIRATORY:  Clear to auscultation without rales, wheezing or rhonchi  ABDOMEN: Soft, non-tender, non-distended MUSCULOSKELETAL:  Ambulates independently with cane*** SKIN: Warm and dry, no edema NEUROLOGIC:  Alert and oriented x 3. No focal neuro deficits noted. PSYCHIATRIC:  Normal affect   ASSESSMENT:    No diagnosis found.  PLAN:    Obstructive CAD, with prior symptoms of syncope and exertional chest pain, now s/p PCI 03/2020 -cath as above, declined CABG, proceeded with PCI -has done well since that time -echo with preserved EF, mild AS, eccentric MR -continue aspirin 81 mg daily, clopidogrel 75 mg daily. Will discuss pros/cons of indefinite DAPT at follow up -continue rosuvastatin 20 mg  daily -discussed use of SL NG -discussed red flag warning signs that need immediate medical attention  Hypertension: at goal today -continue losartan-HCTZ  Hypercholesterolemia: -now on rosuvastatin 20 mg daily -recheck labs due, recheck at follow up if not done sooner (lab closed by end of visit today)  Cardiac risk counseling and prevention recommendations: -recommend heart healthy/Mediterranean diet, with whole grains, fruits, vegetable, fish, lean meats, nuts, and olive oil. Limit salt. -recommend moderate walking, 3-5 times/week for 30-50 minutes each session. Aim for at least 150 minutes.week. Goal should be pace of 3 miles/hours, or walking 1.5 miles in 30 minutes -recommend avoidance of tobacco products. Avoid excess alcohol.  Plan for follow up: ***6 months or sooner as needed.  Buford Dresser, MD, PhD [place]   [facility]    Medication  Adjustments/Labs and Tests Ordered: Current medicines are reviewed at length with the patient today.  Concerns regarding medicines are outlined above.   No orders of the defined types were placed in this encounter.  No orders of the defined types were placed in this encounter.  There are no Patient Instructions on file for this visit.   I,Mathew Stumpf,acting as a Education administrator for [employer], MD.,have documented all relevant documentation on the behalf of Buford Dresser, MD,as directed by  Buford Dresser, MD while in the presence of Buford Dresser, MD.  I, Madelin Rear, have reviewed all documentation for this visit. The documentation on 04/21/2021 for the exam, diagnosis, procedures, and orders are all accurate and complete.  Signed, Buford Dresser, MD PhD 04/17/2021     [place]

## 2021-04-23 NOTE — Procedures (Signed)
Bronchoscopy Procedure Note  NIKOLOS BILLIG  [id number]  1930/06/29  Date:05/02/2021  Time:6:26 PM   Provider Performing:Asjia Berrios   Procedure(s):  Flexible bronchoscopy with bronchial alveolar lavage (78938) and Initial Therapeutic Aspiration of Tracheobronchial Tree [phone])  Indication(s) Acute hypoxic respiratory failure due to aspiration pneumonia  Consent Risks of the procedure as well as the alternatives and risks of each were explained to the patient and/or caregiver.  Consent for the procedure was obtained and is signed in the bedside chart  Anesthesia Etomidate and rocuronium   Time Out Verified patient identification, verified procedure, site/side was marked, verified correct patient position, special equipment/implants available, medications/allergies/relevant history reviewed, required imaging and test results available.   Sterile Technique Usual hand hygiene, masks, gowns, and gloves were used   Procedure Description Bronchoscope advanced through endotracheal tube and into airway.  Airways were examined down to subsegmental level with findings noted below.   Following diagnostic evaluation, BAL(s) performed in left lower lobe with normal saline and return of 5 cc fluid and Therapeutic aspiration performed in right middle and lower lobe  Findings: Copious amount of thin secretions noted in the right middle/right lower lobe and left lower lobe with friable erythematous mucosa   Complications/Tolerance None; patient tolerated the procedure well. Chest X-ray is not needed post procedure.   EBL Minimal   Specimen(s) Sent to lab for culture

## 2021-04-23 NOTE — ED Notes (Signed)
RT at bedside preparing to transfer to ICU

## 2021-04-23 NOTE — ED Provider Notes (Signed)
[facility] EMERGENCY DEPARTMENT Provider Note   CSN: [id number] Arrival date & time: 04/19/2021  0801     History Chief Complaint  Patient presents with   Leg Pain    Gary Riggs is a 87 y.o. male.  HPI Patient reports he has been feeling a little ill over the past day.  He reports he thinks he might of had some hot cold episodes and part of the concern today was for possible COVID or influenza.  He reports the main problem however, is pain that he has in his right hip.  He describes this as chronic from an extremely old high school injury and then subsequent degeneration over 20 years.  However, he reports is very painful right now.  There is a somewhat equivocal history on fall injury.  Patient adamantly denies that he fell.  He was reportedly found on the floor in his home.  He reports this was a miscommunication on certain levels.  He describes something to the effect that his bed covers were on the floor and thus he may have become somewhat confused and lay down on the floor.  This part of the history will need to be clarified by the patient's daughter.  He does seem fairly alert and appropriately oriented despite some inconsistencies.  Part of the chief complaint by triage note is for rambling speech and confusion that apparently is new.  Patient denies he is having any headache.  He denies vomiting.  He reports he does feel thirsty and thinks he is somewhat dehydrated.  He denies diarrhea.  He denies abdominal pain.  He reports he has a lot of pain with movement of the right hip.  Notably, the anterior aspect of the right lower leg is erythematous.  He does not seem to have much pain however in the calf or the lower leg.  Additional history obtained from the patient's daughter over the phone.  She lives in [place] but is on her way to the emergency department.  She reports she spoke with the patient on Wednesday and he was well.  He went out with acquaintances for  lunch.  Thursday he complained of not feeling very good.  No real specific symptoms identified.  Neighbor went to check in on him in the evening and he was in bed.  He seemed little sluggish and may be mildly confused.  He drank fluids and the plan was to check in on the next day.  The neighbor was back at 5:30 AM and found the patient lying on the floor.  He seemed confused and patient*reports she spoke to him on the phone and his speech was very confused.     Past Medical History:  Diagnosis Date   Coronary artery disease    Diverticulosis    DJD (degenerative joint disease)    ED (erectile dysfunction)    Hemorrhoids    Hiatal hernia    Hypertension    Onychomycosis    Rosacea    Thyroid disease     Patient Active Problem List   Diagnosis Date Noted   History of coronary angioplasty with insertion of stent 06/08/2020   Hypercholesterolemia 06/08/2020   Coronary artery disease    Chest pain of uncertain etiology 52/77/8242   Syncope and collapse 03/30/2020   Abnormal stress test 03/30/2020   Upper airway cough syndrome 01/20/2015   Essential hypertension 01/20/2015    Past Surgical History:  Procedure Laterality Date   CORONARY STENT INTERVENTION N/A  03/31/2020   Procedure: CORONARY STENT INTERVENTION;  Surgeon: Jettie Booze, MD;  Location: [place] [facility];  Service: Cardiovascular;  Laterality: N/A;   INTRAVASCULAR ULTRASOUND/IVUS N/A 03/31/2020   Procedure: Intravascular Ultrasound/IVUS;  Surgeon: Jettie Booze, MD;  Location: [place];  Service: Cardiovascular;  Laterality: N/A;   LEFT HEART CATH N/A 03/31/2020   Procedure: Left Heart Cath;  Surgeon: Jettie Booze, MD;  Location: [place] [facility];  Service: Cardiovascular;  Laterality: N/A;   LEFT HEART CATH AND CORONARY ANGIOGRAPHY N/A 03/30/2020   Procedure: LEFT HEART CATH AND CORONARY ANGIOGRAPHY;  Surgeon: Nelva Bush, MD;  Location: [place] [facility];  Service:  Cardiovascular;  Laterality: N/A;       Family History  Family history unknown: Yes    Social History   Tobacco Use   Smoking status: Never   Smokeless tobacco: Never  Vaping Use   Vaping Use: Never used  Substance Use Topics   Alcohol use: No   Drug use: No    Home Medications Prior to Admission medications   Medication Sig Start Date End Date Taking? Authorizing Provider  Ascorbic Acid (VITAMIN C) 1000 MG tablet Take 1,000 mg by mouth daily.    [provider]  aspirin 81 MG chewable tablet Chew 81 mg by mouth daily.     [provider]  Cholecalciferol (VITAMIN D-3) 1000 UNITS CAPS Take 1,000 Units by mouth daily.     [provider]  clopidogrel (PLAVIX) 75 MG tablet Take 1 tablet by mouth once daily with breakfast 03/16/21   Buford Dresser, MD  Cyanocobalamin (VITAMIN B-12 PO) Take 2,500 mcg by mouth daily.     [provider]  GLUCOS-CHONDROIT-MSM-C-HYAL PO Take 1 tablet by mouth daily. 1500/1200    [provider]  levothyroxine (SYNTHROID) 137 MCG tablet Take 137 mcg by mouth daily before breakfast.  12/13/19   [provider]  losartan-hydrochlorothiazide (HYZAAR) 100-12.5 MG per tablet Take 1 tablet by mouth daily.    [provider]  Multiple Minerals-Vitamins (CALCIUM CITRATE-MAG-MINERALS PO) Take 1 tablet by mouth daily. Vit D3/ Zinc    [provider]  Multiple Vitamin (MULTIVITAMIN) capsule Take 1 capsule by mouth daily.    [provider]  nitroGLYCERIN (NITROSTAT) 0.4 MG SL tablet Place 0.4 mg under the tongue every 5 (five) minutes as needed for chest pain.    [provider]  OVER THE COUNTER MEDICATION Take 1 capsule by mouth daily. [employer]    [provider]  RESVERATROL PO Take 100 mg by mouth daily. Omega Q plus    [provider]  rosuvastatin (CRESTOR) 20 MG tablet TAKE 1 TABLET EVERY DAY (REPLACES SIMVASTATIN) 03/30/21   Buford Dresser, MD  tamsulosin (FLOMAX) 0.4 MG CAPS capsule Take 0.4 mg by mouth at bedtime.     [provider]    Allergies    Aleve [naproxen sodium], Tussionex pennkinetic er [hydrocod polst-cpm polst er], and Synthroid [levothyroxine sodium]  Review of Systems   Review of Systems 10 Systems reviewed and negative except as per HPI Physical Exam Updated Vital Signs BP (!) 153/82   Pulse 80   Temp (!) 97.5 F (36.4 C)   Resp (!) 24   Ht 6\' 1"  (1.854 m)   Wt 83.9 kg   SpO2 93%   BMI 24.41 kg/m   Physical Exam Constitutional:      Comments: Alert and easily interactive.  No respiratory distress at  rest.  Nontoxic.  HENT:     Head: Normocephalic and atraumatic.     Mouth/Throat:     Comments: Mucous membranes are dry in appearance.  Airway is patent. Eyes:     Extraocular Movements: Extraocular movements intact.     Pupils: Pupils are equal, round, and reactive to light.  Neck:     Comments: No C-spine tenderness to palpation. Cardiovascular:     Comments: Heart regular.  3 out of 6 systolic ejection murmur. Pulmonary:     Comments: No respiratory distress.  Breath sounds are quite soft throughout.  No focal crackle or rhonchi Abdominal:     Comments: Abdomen is soft and nondistended.  Patient does not Dors any discomfort to palpation.  Genitourinary:    Comments: Normal-appearing uncircumcised penis.  Normal-appearing scrotum.  No rash or erythema in the genital or perineal area Musculoskeletal:     Cervical back: Neck supple.     Comments: Visual inspection of the back does not show any evident contusions or abrasions.  Patient does not endorse bony tenderness to the spine.  Patient does have an evident about 10 cm raised erythematous tender area over the point of the hip on the right.  No abrasion.  This is well circumcised and focally swollen.  The anterior aspect of the lower right leg has diffuse erythema but is not circumferential.  See attached images.  There  are some minor superficial abrasions to the dorsum of each foot.  The distal pulses are 2+ symmetric.  The extremities are warm and dry distally.  Skin:    General: Skin is warm and dry.  Neurological:     Comments: Patient is alert.  Question some mild confusion.  Sometimes speech seems a little tangential but may also be difficulty hearing.  Sentences are organized.  There are no focal motor deficits.  Patient has some pain limitations with movement of the right lower extremity but assist in following commands to best a physical capacity.      ED Results / Procedures / Treatments   Labs (all labs ordered are listed, but only abnormal results are displayed) Labs Reviewed  LACTIC ACID, PLASMA - Abnormal; Notable for the following components:      Result Value   Lactic Acid, Venous 2.6 (*)    All other components within normal limits  LACTIC ACID, PLASMA - Abnormal; Notable for the following components:   Lactic Acid, Venous 6.6 (*)    All other components within normal limits  COMPREHENSIVE METABOLIC PANEL - Abnormal; Notable for the following components:   Sodium 131 (*)    Chloride 97 (*)    Glucose, Bld 113 (*)    BUN 25 (*)    AST 128 (*)    All other components within normal limits  CBC WITH DIFFERENTIAL/PLATELET - Abnormal; Notable for the following components:   WBC 28.1 (*)    Neutro Abs 25.6 (*)    Monocytes Absolute 1.5 (*)    Abs Immature Granulocytes 0.22 (*)    All other components within normal limits  PROTIME-INR - Abnormal; Notable for the following components:   Prothrombin Time 15.6 (*)    All other components within normal limits  CK - Abnormal; Notable for the following components:   Total CK 1,733 (*)    All other components within normal limits  TROPONIN I (HIGH SENSITIVITY) - Abnormal; Notable for the following components:   Troponin I (High Sensitivity) 4,631 (*)    All other components  within normal limits  TROPONIN I (HIGH SENSITIVITY) - Abnormal;  Notable for the following components:   Troponin I (High Sensitivity) 2,574 (*)    All other components within normal limits  RESP PANEL BY RT-PCR (FLU A&B, COVID) ARPGX2  CULTURE, BLOOD (ROUTINE X 2)  CULTURE, BLOOD (ROUTINE X 2)  URINE CULTURE  APTT  URINALYSIS, ROUTINE W REFLEX MICROSCOPIC  HEPARIN LEVEL (UNFRACTIONATED)    EKG EKG Interpretation  Date/Time:  Friday April 23 2021 09:08:26 EST Ventricular Rate:  75 PR Interval:    QRS Duration: 114 QT Interval:  406 QTC Calculation: 354 R Axis:   -55 Text Interpretation: SR Incomplete RBBB and LAFB Borderline ST depression, lateral leads Artifact in lead(s) I II III aVR aVL V1 V2 V3 V5 artifact impedes interpretation of V1 V2 Twaves, but no acutely ischemic appearance, no sig change from previous Confirmed by Charlesetta Shanks [phone]) on 04/22/2021 10:22:35 AM  Radiology CT Head Wo Contrast  Result Date: 04/29/2021 CLINICAL DATA:  87 year old male with history of mental status change. Found down on the floor. EXAM: CT HEAD WITHOUT CONTRAST TECHNIQUE: Contiguous axial images were obtained from the base of the skull through the vertex without intravenous contrast. COMPARISON:  No priors. FINDINGS: Brain: Mild cerebral atrophy. Patchy and confluent areas of decreased attenuation are noted throughout the deep and periventricular white matter of the cerebral hemispheres bilaterally, compatible with chronic microvascular ischemic disease. No evidence of acute infarction, hemorrhage, hydrocephalus, extra-axial collection or mass lesion/mass effect. Vascular: No hyperdense vessel or unexpected calcification. Skull: Normal. Negative for fracture or focal lesion. Sinuses/Orbits: No acute finding. Other: None. IMPRESSION: 1. No acute intracranial abnormalities. 2. Mild cerebral atrophy with extensive chronic microvascular ischemic changes in the cerebral white matter, as above. Electronically Signed   By: Vinnie Langton M.D.   On: 04/21/2021  09:55   DG Chest Port 1 View  Result Date: 05/04/2021 CLINICAL DATA:  Concern for sepsis EXAM: PORTABLE CHEST 1 VIEW COMPARISON:  Chest x-ray dated November 21, 2017 FINDINGS: Cardiac and mediastinal contours are within normal limits for AP technique. Mild bibasilar opacities. No large pleural effusion or pneumothorax. IMPRESSION: Mild bibasilar opacities, likely atelectasis. Electronically Signed   By: Yetta Glassman M.D.   On: 04/14/2021 09:03   DG HIP UNILAT WITH PELVIS 1V RIGHT  Result Date: 04/07/2021 CLINICAL DATA:  Right hip pain without known injury. EXAM: DG HIP (WITH OR WITHOUT PELVIS) 1V RIGHT COMPARISON:  July 10, 2020. FINDINGS: There is no evidence of hip fracture or dislocation. Severe narrowing and osteophyte formation is seen involving the right hip. IMPRESSION: Severe osteoarthritis of the right hip. No acute abnormality is noted. Electronically Signed   By: Marijo Conception M.D.   On: 04/12/2021 10:04    Procedures Procedures  CRITICAL CARE Performed by: Charlesetta Shanks   Total critical care time: 60 minutes  Critical care time was exclusive of separately billable procedures and treating other patients.  Critical care was necessary to treat or prevent imminent or life-threatening deterioration.  Critical care was time spent personally by me on the following activities: development of treatment plan with patient and/or surrogate as well as nursing, discussions with consultants, evaluation of patient's response to treatment, examination of patient, obtaining history from patient or surrogate, ordering and performing treatments and interventions, ordering and review of laboratory studies, ordering and review of radiographic studies, pulse oximetry and re-evaluation of patient's condition.  Medications Ordered in ED Medications  lactated ringers infusion ( Intravenous New Bag/Given 04/13/2021  0931)  heparin ADULT infusion 100 units/mL (25000 units/217mL) (1,000 Units/hr  Intravenous New Bag/Given 04/11/2021 1207)  lactated ringers bolus 500 mL (has no administration in time range)  cefTRIAXone (ROCEPHIN) 2 g in sodium chloride 0.9 % 100 mL IVPB (has no administration in time range)  heparin bolus via infusion 4,000 Units (4,000 Units Intravenous Bolus from Bag 04/13/2021 1207)    ED Course  I have reviewed the triage vital signs and the nursing notes.  Pertinent labs & imaging results that were available during my care of the patient were reviewed by me and considered in my medical decision making (see chart for details).  Clinical Course as of 04/09/2021 1339  Fri Apr 23, 2021  1210 Consult: [facility]. [MP]  1211 Consult: Cardiology Dr. Margaretann Loveless.  Agrees with starting heparin based on current findings.  Request medical admission and cardiology will do formal consult. [MP]  1337 Consult: [facility] hospitalist Dr. Earnest Conroy For admission [MP]    Clinical Course User Index [MP] Charlesetta Shanks, MD   MDM Rules/Calculators/A&P                          Recheck: 0932 patient continues to deny any chest pain or shortness of breath.  This was negative on review of systems and he continues to deny.  Vital signs are stable.  Heart rate and blood pressure are normal.    Patient troponin is significantly elevated.  At this time have to assume ACS.  A repeat EKG does show lateral depression.  Will initiate heparin.  We will also get DVT study on the right lower extremity.  Will add CK.  The area on the patient's hip is fairly limited.  I doubt a large area of muscle injury from possible prolonged downtime.  The rest of his physical exam does not suggest this.  We will also need Ortho consult.  Consideration for sepsis with appearance of cellulitis and possible septic bursitis over the hip.  Patient has significant leukocytosis.  This however may also be in conjunction with delayed presentation of MI.  Clinically patient does not show signs of  sepsis.  Patient's mental status remains alert and interactive.  Blood pressures remained stable.  Concern for possible early sepsis.  Patient has source with cellulitis of the lower leg, significant leukocytosis and mild mental status change.  Rocephin 2 g IV initiated.  Patient will be admitted to medical service.  Consult have been placed for orthopedics and cardiology.  Patient has multiple significant comorbid conditions at this time.    Final Clinical Impression(s) / ED Diagnoses Final diagnoses:  Cellulitis of right lower extremity  Elevated troponin  NSTEMI (non-ST elevated myocardial infarction) [facility])    Rx / DC Orders ED Discharge Orders     None        Charlesetta Shanks, MD 05/02/2021 1343

## 2021-04-23 NOTE — Progress Notes (Signed)
Lower extremity venous has been completed.   Preliminary results in CV Proc.   Gary Riggs 04/18/2021 3:39 PM

## 2021-04-23 NOTE — Progress Notes (Signed)
Spoke with RN. Pt. Currently has [place] placed IV for pressor. Pt. Being transferred to ICU.

## 2021-04-23 NOTE — Progress Notes (Addendum)
[facility] Progress Note Patient Name: Gary Riggs DOB: 12-27-1930 MRN: [id number]   Date of Service  04/07/2021  HPI/Events of Note  Remains hypotensive with SBP 60s (MAP 62). SvO2 25% CVP 10  eICU Interventions  Add epi gtt. LR 500 cc bolus Poor prognosis. Ground team discussed with family who wishes to continue full code and understands his mortality is high despite aggressive measures     Intervention Category Major Interventions: Hypotension - evaluation and management  Olla Delancey Rodman Pickle 04/16/2021, 10:35 PM

## 2021-04-23 NOTE — Progress Notes (Addendum)
[facility] Progress Note Patient Name: Gary Riggs DOB: [birth year] MRN: [id number]   Date of Service  04/27/2021  HPI/Events of Note  46M admitted for AMS 2/2 NSTEMI c/b aspiration PNA and rhabdomyolysis. Found down with troponin 4000 and CK 1600. Intubated in the ED and s/p bronchoscopy with therapeutic aspiration of RML and RLL of thin secretions. Started on levophed gtt for hypotension.  On arrival from ED to 3M11, patient developed SVT to 170s. SBP 120s. Labetalol 5 mg IV given and carotid massage. HR returned to NSR in 90s. Repeat CXR with increased consolidation of R pneumonia  eICU Interventions  Monitor telemetry Wean pressors, IVF, continue abx Trend LA, echo Full vent support     Intervention Category Major Interventions: Arrhythmia - evaluation and management  Jerelyn Trimarco Rodman Pickle 04/30/2021, 7:19 PM

## 2021-04-23 NOTE — Significant Event (Signed)
OVERNIGHT COVERAGE CRITICAL CARE PROGRESS NOTE  CTSP re: tachycardia, HR 180.  The patient recently transferred to 3M11 from ER.  Upon arrival, the patient appears to be in a regular, narrow complex tachyarrhythmia on the monitor but 12-lead EKG shows a wide-complex tachyarrhythmia with nonspecific intraventricular conduction delay.  Palpable pulse.  Carotid massage resulted in conversion from apparent SVT to sinus tachycardia (HR 110).  Also, Dr. Loanne Drilling had ordered (administration confirmed) labetalol 5 mg IV single dose shortly before my arrival.  Heparin was reportedly held at the time of transfer from ER.  On Levophed @ 25.  Daughter updated. Will need CVC.  CVP monitoring and SvO2 check. Restart heparin gtt after CVC placement.  Critical care time: 30 minutes.  The treatment and management of the patient's condition was required based on the threat of imminent deterioration. This time reflects time spent by the physician evaluating, providing care and managing the critically ill patient's care. The time was spent at the immediate bedside (or on the same floor/unit and dedicated to this patient's care). Time involved in separately billable procedures is NOT included int he critical care time indicated above. Family meeting and update time may be included above if and only if the patient is unable/incompetent to participate in clinical interview and/or decision making, and the discussion was necessary to determining treatment decisions.  Renee Pain, MD Board Certified by the ABIM, [place]

## 2021-04-23 NOTE — Progress Notes (Signed)
Assisted MD bedside bronchoscopy.  Sputum obtained and sent.

## 2021-04-23 NOTE — Progress Notes (Signed)
Assisted in transporting patient to 3M11 RN at bedside.

## 2021-04-23 NOTE — ED Notes (Signed)
Bronchoscopy at bedside, Dr Currie Paris.

## 2021-04-23 NOTE — ED Notes (Addendum)
ED Provider at bedside. RT at bedside. IV fluids paused.

## 2021-04-23 NOTE — ED Notes (Signed)
US to bedside

## 2021-04-23 NOTE — Procedures (Signed)
Central Venous Catheter Insertion Procedure Note  Gary Riggs  [id number]  02-03-1931  Date:04/17/2021  Time:8:51 PM   Provider Performing:Seong-Joo Carson Myrtle   Procedure: Insertion of Non-tunneled Central Venous [phone]) with US guidance ([id number])   Indication(s) Medication administration and Difficult access  Consent Risks of the procedure as well as the alternatives and risks of each were explained to the patient and/or caregiver.  Consent for the procedure was obtained and is signed in the bedside chart  Anesthesia Topical only with 1% lidocaine   Timeout Verified patient identification, verified procedure, site/side was marked, verified correct patient position, special equipment/implants available, medications/allergies/relevant history reviewed, required imaging and test results available.  Sterile Technique Maximal sterile technique including full sterile barrier drape, hand hygiene, sterile gown, sterile gloves, mask, hair covering, sterile ultrasound probe cover (if used).  Procedure Description Area of catheter insertion was cleaned with chlorhexidine and draped in sterile fashion.  With real-time ultrasound guidance a central venous catheter was placed into the left internal jugular vein. Nonpulsatile blood flow and easy flushing noted in all ports.  The catheter was sutured in place and sterile dressing applied.  Complications/Tolerance None; patient tolerated the procedure well. Chest X-ray is ordered to verify placement for internal jugular or subclavian cannulation.   Chest x-ray is not ordered for femoral cannulation.  EBL Minimal  Specimen(s) None   Renee Pain, MD Board Certified by the ABIM, [place]

## 2021-04-23 NOTE — Progress Notes (Signed)
RT attempted multiple times to insert artline and was unsuccessful. CCM was notified.

## 2021-04-23 NOTE — Progress Notes (Signed)
Pharmacy Antibiotic Note  Gary Riggs is a 87 y.o. male admitted on 04/17/2021 with redness on R hip and R shin found to be cellulitis. WBC 28.1 and afebrile. Pharmacy has been consulted for Vancomycin dosing.  AUC goal: 400-550  Plan: Vancomycin 1750mg  x 1 load Vancomcyin 1500mg  q24h (eAUC 474, Scr 1.05) F/u renal function and vancomycin level as needed  Height: 6\' 1"  (185.4 cm) Weight: 83.9 kg (185 lb) IBW/kg (Calculated) : 79.9  Temp (24hrs), Avg:97.5 F (36.4 C), Min:97.5 F (36.4 C), Max:97.5 F (36.4 C)  Recent Labs  Lab 04/22/2021 0920 04/13/2021 1142  WBC 28.1*  --   CREATININE 1.05  --   LATICACIDVEN 2.6* 6.6*    Estimated Creatinine Clearance: 55 mL/min (by C-G formula based on SCr of 1.05 mg/dL).    Allergies  Allergen Reactions   Advil [Ibuprofen] Shortness Of Breath and Swelling   Aleve [Naproxen Sodium] Other (See Comments)    angioedema   Tussionex Pennkinetic Er [Hydrocod Polst-Cpm Polst Er] Swelling and Other (See Comments)    Face swelling   Synthroid [Levothyroxine Sodium] Other (See Comments)    Not tolerated name brand    Antimicrobials this admission: Vancomycin 11/18>  Dose adjustments this admission:  Microbiology results: 11/18 BCx: sent 11/18 UCx: sent    Thank you for allowing pharmacy to participate in this patient's care.  Levonne Spiller, PharmD PGY1 Acute Care Resident  04/24/2021,4:55 PM

## 2021-04-23 NOTE — H&P (Signed)
History and Physical    DOA: 04/11/2021  PCP: Josetta Huddle, MD  Patient coming from: Home  Chief Complaint: Altered mental status/confusion x1 day  HPI: Gary Riggs is a 87 y.o. male with history h/o CAD s/p PTCA x2 in October 2021, hypertension, BPH, hypothyroidism, hiatal hernia, DJD brought in due to concerns for altered mental status and new confusion since yesterday.  Patient currently awake and able to participate in conversation but appears confused and inconsistent at times.  Most of the history obtained by talking to daughter, Gary Riggs, who is at bedside.  Daughter apparently lives in [place] and calls to check on patient every day.  According to daughter patient very active at baseline drives and performs all ADLs by himself.  She spoke to him Wednesday evening (11/16) when he appeared to be in his normal state of mind.  Later in the night one of his friends noted that he appeared somewhat disoriented during a phone conversation.  Thursday morning daughter tried to call patient's several times but could not reach him.  She requested one of his neighbors to check on him.  Patient apparently was sleeping in his bed and all the lights in the house were off on neighbors arrival.  He spoke to the daughter on the phone at that time and stated that he was generally feeling weak and ill and just wanted to rest.  No reports of fever, chills or chest pain or other complaints at that point of time.  This morning patient was found laying on the floor (suspected to have been there for several hours) and EMS summoned.  Patient adamantly denies falling and feels he might have slipped-slided to the floor while trying to reach something.  Does report exacerbation of chronic right hip pain (history of trauma several years ago with intermittent pain 5/10 and currently rating it 9/10).  Denies any chest pain or shortness of breath.  He does report some dysuria with urinary hesitancy and at times incontinence.   He does not remember being diagnosed with prostate issues although problem list and medication list consistent with history of BPH. ED work-up: Afebrile, temp 97.5 on arrival, slightly hypertensive with SBP 140-160, RR 22-25, O2 sat 94 to 95% on room air, heart rate 70-90s.  Patient noted to have redness along the right hip and right shin.  Lab work returned grossly abnormal with WBC 28K, hemoglobin 14.2, sodium 131, BUN 25, creatinine 1.05, total CK elevated at 1700, HS troponin 4631->now downtrending to 2574, lactate 2.6->now uptrending to 6.6.  Chest x-ray shows no acute abnormality, COVID and influenza test negative, UA pending.  EKG showed lateral ST depressions, patient evaluated by cardiology and now initiated on IV heparin as well as IV antibiotics/IV fluids per sepsis protocol.  Review of Systems: As per HPI, otherwise review of systems negative.    Past Medical History:  Diagnosis Date   Coronary artery disease    Diverticulosis    DJD (degenerative joint disease)    ED (erectile dysfunction)    Hemorrhoids    Hiatal hernia    Hypertension    Onychomycosis    Rosacea    Thyroid disease     Past Surgical History:  Procedure Laterality Date   CORONARY STENT INTERVENTION N/A 03/31/2020   Procedure: CORONARY STENT INTERVENTION;  Surgeon: Jettie Booze, MD;  Location: [facility];  Service: Cardiovascular;  Laterality: N/A;   INTRAVASCULAR ULTRASOUND/IVUS N/A 03/31/2020   Procedure: Intravascular Ultrasound/IVUS;  Surgeon: Larae Grooms  S, MD;  Location: [facility];  Service: Cardiovascular;  Laterality: N/A;   LEFT HEART CATH N/A 03/31/2020   Procedure: Left Heart Cath;  Surgeon: Jettie Booze, MD;  Location: [facility];  Service: Cardiovascular;  Laterality: N/A;   LEFT HEART CATH AND CORONARY ANGIOGRAPHY N/A 03/30/2020   Procedure: LEFT HEART CATH AND CORONARY ANGIOGRAPHY;  Surgeon: Nelva Bush, MD;  Location: [facility];   Service: Cardiovascular;  Laterality: N/A;    Social history:  reports that he has never smoked. He has never used smokeless tobacco. He reports that he does not drink alcohol and does not use drugs.   Allergies  Allergen Reactions   Advil [Ibuprofen] Shortness Of Breath and Swelling   Aleve [Naproxen Sodium] Other (See Comments)    angioedema   Tussionex Pennkinetic Er [Hydrocod Polst-Cpm Polst Er] Swelling and Other (See Comments)    Face swelling   Synthroid [Levothyroxine Sodium] Other (See Comments)    Not tolerated name brand    Family History  Family history unknown: Yes      Prior to Admission medications   Medication Sig Start Date End Date Taking? Authorizing Provider  Ascorbic Acid (VITAMIN C) 1000 MG tablet Take 1,000 mg by mouth daily.   Yes [provider]  aspirin 81 MG chewable tablet Chew 81 mg by mouth daily.    Yes [provider]  Cholecalciferol (VITAMIN D-3) 1000 UNITS CAPS Take 1,000 Units by mouth daily.    Yes [provider]  clopidogrel (PLAVIX) 75 MG tablet Take 1 tablet by mouth once daily with breakfast 03/16/21  Yes Buford Dresser, MD  Cyanocobalamin (VITAMIN B-12 PO) Take 2,500 mcg by mouth every other day.   Yes [provider]  GLUCOS-CHONDROIT-MSM-C-HYAL PO Take 1 tablet by mouth daily. 1500/1200   Yes [provider]  levothyroxine (SYNTHROID) 137 MCG tablet Take 137 mcg by mouth daily before breakfast.  12/13/19  Yes [provider]  losartan-hydrochlorothiazide (HYZAAR) 100-12.5 MG per tablet Take 1 tablet by mouth daily.   Yes [provider]  Multiple Minerals-Vitamins (CALCIUM CITRATE-MAG-MINERALS PO) Take 1 tablet by mouth daily. Vit D3/ Zinc   Yes [provider]  Multiple Vitamin (MULTIVITAMIN) capsule Take 1 capsule by mouth daily.   Yes [provider]  nitroGLYCERIN (NITROSTAT) 0.4 MG SL tablet Place 0.4 mg under the tongue every 5 (five) minutes as  needed for chest pain.   Yes [provider]  rosuvastatin (CRESTOR) 20 MG tablet TAKE 1 TABLET EVERY DAY (REPLACES SIMVASTATIN) Patient taking differently: Take 20 mg by mouth daily. 03/30/21  Yes Buford Dresser, MD  tamsulosin (FLOMAX) 0.4 MG CAPS capsule Take 0.4 mg by mouth at bedtime.    Yes [provider]    Physical Exam: Vitals:   04/07/2021 1315 04/23/2021 1330 04/29/2021 1415 04/10/2021 1520  BP:  (!) 162/76 (!) 149/72 (!) 159/91  Pulse: 80 85 75 90  Resp: (!) 24 (!) 25 (!) 23 (!) 22  Temp:      SpO2: 93% 95% 95% 93%  Weight:      Height:        Constitutional: NAD, calm, comfortable Eyes: PERRL, lids and conjunctivae normal ENMT: Mucous membranes are moist. Posterior pharynx clear of any exudate or lesions.Normal dentition.  Neck: normal, supple, no masses, no thyromegaly Respiratory: clear to auscultation bilaterally, no wheezing, no crackles. Normal respiratory effort. No accessory muscle use.  Cardiovascular: Regular rate and rhythm, loud systolic murmur  at apex, no rubs / gallops. No extremity edema. 2+ pedal pulses. No carotid bruits.  Abdomen: no tenderness, no masses palpated. No hepatosplenomegaly. Bowel sounds positive.  Musculoskeletal: no clubbing / cyanosis. No joint deformity upper and lower extremities. Good ROM, no contractures. Normal muscle tone.  Neurologic: CN 2-12 grossly intact. Sensation intact, DTR normal. Strength 5/5 in all 4.  Psychiatric: Appears to have some insight but not consistent, poor judgment.  Alert and oriented x 3 (answered appropriately for time place and person questions). Normal mood.  SKIN/catheters: Area of redness along right anterior shin (marked by pen) as well as right hip with warmth and tenderness as shown below.   Labs on Admission: I have personally reviewed following labs and imaging studies  CBC: Recent Labs  Lab 04/29/2021 0920  WBC 28.1*  NEUTROABS 25.6*  HGB 14.2  HCT 43.8  MCV 98.2  PLT 250    Basic Metabolic Panel: Recent Labs  Lab 04/06/2021 0920  NA 131*  K 3.7  CL 97*  CO2 23  GLUCOSE 113*  BUN 25*  CREATININE 1.05  CALCIUM 9.2   GFR: Estimated Creatinine Clearance: 55 mL/min (by C-G formula based on SCr of 1.05 mg/dL). Recent Labs  Lab 04/08/2021 0920 04/20/2021 1142  WBC 28.1*  --   LATICACIDVEN 2.6* 6.6*   Liver Function Tests: Recent Labs  Lab 04/06/2021 0920  AST 128*  ALT 38  ALKPHOS 50  BILITOT 1.1  PROT 7.5  ALBUMIN 3.9   No results for input(s): LIPASE, AMYLASE in the last 168 hours. No results for input(s): AMMONIA in the last 168 hours. Coagulation Profile: Recent Labs  Lab 04/18/2021 0920  INR 1.2   Cardiac Enzymes: Recent Labs  Lab 04/08/2021 1120  CKTOTAL 1,733*   BNP (last 3 results) No results for input(s): PROBNP in the last 8760 hours. HbA1C: No results for input(s): HGBA1C in the last 72 hours. CBG: No results for input(s): GLUCAP in the last 168 hours. Lipid Profile: No results for input(s): CHOL, HDL, LDLCALC, TRIG, CHOLHDL, LDLDIRECT in the last 72 hours. Thyroid Function Tests: No results for input(s): TSH, T4TOTAL, FREET4, T3FREE, THYROIDAB in the last 72 hours. Anemia Panel: No results for input(s): VITAMINB12, FOLATE, FERRITIN, TIBC, IRON, RETICCTPCT in the last 72 hours. Urine analysis: No results found for: COLORURINE, APPEARANCEUR, LABSPEC, PHURINE, GLUCOSEU, HGBUR, BILIRUBINUR, KETONESUR, PROTEINUR, UROBILINOGEN, NITRITE, LEUKOCYTESUR  Radiological Exams on Admission: Personally reviewed  CT Head Wo Contrast  Result Date: 05/05/2021 CLINICAL DATA:  87 year old male with history of mental status change. Found down on the floor. EXAM: CT HEAD WITHOUT CONTRAST TECHNIQUE: Contiguous axial images were obtained from the base of the skull through the vertex without intravenous contrast. COMPARISON:  No priors. FINDINGS: Brain: Mild cerebral atrophy. Patchy and confluent areas of decreased attenuation are noted throughout  the deep and periventricular white matter of the cerebral hemispheres bilaterally, compatible with chronic microvascular ischemic disease. No evidence of acute infarction, hemorrhage, hydrocephalus, extra-axial collection or mass lesion/mass effect. Vascular: No hyperdense vessel or unexpected calcification. Skull: Normal. Negative for fracture or focal lesion. Sinuses/Orbits: No acute finding. Other: None. IMPRESSION: 1. No acute intracranial abnormalities. 2. Mild cerebral atrophy with extensive chronic microvascular ischemic changes in the cerebral white matter, as above. Electronically Signed   By: Vinnie Langton M.D.   On: 04/15/2021 09:55   DG Chest Port 1 View  Result Date: 04/15/2021 CLINICAL DATA:  Concern for sepsis EXAM: PORTABLE CHEST 1 VIEW COMPARISON:  Chest x-ray dated November 21, 2017 FINDINGS: Cardiac and mediastinal contours are within normal limits for AP technique. Mild bibasilar opacities. No large pleural effusion or pneumothorax. IMPRESSION: Mild bibasilar opacities, likely atelectasis. Electronically Signed   By: Yetta Glassman M.D.   On: 04/06/2021 09:03   DG HIP UNILAT WITH PELVIS 1V RIGHT  Result Date: 04/27/2021 CLINICAL DATA:  Right hip pain without known injury. EXAM: DG HIP (WITH OR WITHOUT PELVIS) 1V RIGHT COMPARISON:  July 10, 2020. FINDINGS: There is no evidence of hip fracture or dislocation. Severe narrowing and osteophyte formation is seen involving the right hip. IMPRESSION: Severe osteoarthritis of the right hip. No acute abnormality is noted. Electronically Signed   By: Marijo Conception M.D.   On: 04/26/2021 10:04   VAS [place] LOWER EXTREMITY VENOUS (DVT)  Result Date: 04/21/2021  Lower Venous DVT Study Patient Name:  Gary Riggs  Date of Exam:   04/18/2021 Medical Rec #: [id number]         Accession #:    [id number] Date of Birth: November 28, 1930         Patient Gender: M Patient Age:   72 years Exam Location:  [facility] Procedure:      VAS [place] LOWER  EXTREMITY VENOUS (DVT) Referring Phys: [facility] PFEIFFER --------------------------------------------------------------------------------  Indications: Stroke.  Comparison Study: no prior Performing Technologist: Archie Patten RVS  Examination Guidelines: A complete evaluation includes B-mode imaging, spectral Doppler, color Doppler, and power Doppler as needed of all accessible portions of each vessel. Bilateral testing is considered an integral part of a complete examination. Limited examinations for reoccurring indications may be performed as noted. The reflux portion of the exam is performed with the patient in reverse Trendelenburg.  +---------+---------------+---------+-----------+----------+--------------+ RIGHT    CompressibilityPhasicitySpontaneityPropertiesThrombus Aging +---------+---------------+---------+-----------+----------+--------------+ CFV      Full           Yes      Yes                                 +---------+---------------+---------+-----------+----------+--------------+ SFJ      Full                                                        +---------+---------------+---------+-----------+----------+--------------+ FV Prox  Full                                                        +---------+---------------+---------+-----------+----------+--------------+ FV Mid   Full                                                        +---------+---------------+---------+-----------+----------+--------------+ FV DistalFull                                                        +---------+---------------+---------+-----------+----------+--------------+  PFV      Full                                                        +---------+---------------+---------+-----------+----------+--------------+ POP      Full           Yes      Yes                                 +---------+---------------+---------+-----------+----------+--------------+ PTV       Full                                                        +---------+---------------+---------+-----------+----------+--------------+ PERO     Full                                                        +---------+---------------+---------+-----------+----------+--------------+   +----+---------------+---------+-----------+----------+--------------+ LEFTCompressibilityPhasicitySpontaneityPropertiesThrombus Aging +----+---------------+---------+-----------+----------+--------------+ CFV Full           Yes      Yes                                 +----+---------------+---------+-----------+----------+--------------+     Summary: RIGHT: - There is no evidence of deep vein thrombosis in the lower extremity.  - No cystic structure found in the popliteal fossa.  LEFT: - No evidence of common femoral vein obstruction.  *See table(s) above for measurements and observations.    Preliminary     EKG: Independently reviewed.  Normal sinus rhythm with nonspecific lateral ST depressions on initial EKG and significant ST depressions on repeat EKG     Assessment and Plan:   Principal Problem:   Sepsis ([place]) Active Problems:   NSTEMI (non-ST elevated myocardial infarction) ([place])   Cellulitis   Rhabdomyolysis   Essential hypertension   Coronary artery disease   Hypercholesterolemia   Lactic acidosis    1.Right lower extremity cellulitis, sepsis: Patient afebrile but slightly tachycardic, tachypneic on presentation with elevated WBC, lactic acidosis and right lower extremity redness consistent with cellulitis-could have been precipitated by fall or trauma although patient does not recollect either.  Blood cultures sent from the ED.  No open wounds to culture.  Continue empiric antibiotics started in the ED.  Monitor response and titrate as needed.  Since repeat lactic acid level elevated at 6, will add vancomycin to antibiotic regimen.  Follow-up blood culture response and serial labs  for WBC/LA levels.  Given stable BP and diastolic dysfunction on prior echo, will reduce IV fluid rate in few hours.  2.  Right hip redness, rhabdomyolysis: Could be secondary to laying on the floor for prolonged period of time.  Continue IV fluids for elevated CK.  Monitor CK/troponin levels with serial labs.  Watch for signs of fluid overload.  Seen by orthopedics for hip pain/area of redness and swelling-marked by pen.  X-ray negative  for fractures.  CT hip recommended and pending at the time of this dictation.  3.  Non-STEMI: Seen by cardiology.  Given EKG changes, treating with IV heparin.  Patient does have a history of PTCA x2 to LAD a year back and has 95% LCx blockage not amendable for treatment.  He was supposed to follow-up with primary cardiologist today regarding continued need for Plavix therapy.  Resumed aspirin and Plavix for now.  Watch for any signs of bleeding and follow cardiology recommendations.  Hold statins for now in concern for problem #2.  Repeat echo (diastolic dysfunction on prior echo-watch on IV fluids)  4. Lactic acidosis secondary to problem #1 versus problem #2.  Monitor response to IV fluids.  5.  Acute metabolic encephalopathy: In the setting of problem #1 , problem #2 and problem #3.  Patient still confused at times according to daughter but currently oriented x3.  CT head showed mild atrophy and chronic microvascular changes.  Supportive management and delirium precautions for now.  6.  BPH: Resume Flomax.  Monitor for any signs of urinary retention.  7.  Vitamin B12, vitamin D deficiencies: Resume home medications  8. Hypertension: Resumed home medications.   DVT prophylaxis: On heparin drip  COVID screen: Negative  Code Status: Full code.Health care proxy would be daughter, Gary Riggs  Patient/Family Communication: Discussed with patient and all questions answered to satisfaction.  Consults called: Orthopedics, cardiology Admission status :I certify that at  the point of admission it is my clinical judgment that the patient will require inpatient hospital care spanning beyond 2 midnights from the point of admission due to high intensity of service and high frequency of surveillance required.Inpatient status is judged to be reasonable and necessary in order to provide the required intensity of service to ensure the patient's safety. The patient's presenting symptoms, physical exam findings, and initial radiographic and laboratory data in the context of their chronic comorbidities is felt to place them at high risk for further clinical deterioration. The following factors support the patient status of inpatient : Sepsis needing IV antibiotics, IV fluids and NSTEMI requiring IV heparin     Guilford Shi MD [facility] Pager in [place]  If 7PM-7AM, please contact night-coverage [email or web address]   04/26/2021, 4:15 PM

## 2021-04-23 NOTE — ED Notes (Signed)
Cleaned for incontinent of urine, skin care given and linens changed. C/O right hip pain with turning.

## 2021-04-23 NOTE — Consult Note (Signed)
Cardiology Consultation:   Patient ID: Gary Riggs MRN: [id number]; DOB: [birth year]  Admit date: 05/03/2021 Date of Consult: 05/05/2021  PCP:  Josetta Huddle, MD   [facility] Providers Cardiologist:  Buford Dresser, MD   {   Patient Profile:   Gary Riggs is a 87 y.o. male with a hx of CAD s/p DES to mid RCA and proximal/mid LAD in October 2020 when with residual circumflex disease, hypertension, hyperlipidemia who is being seen 05/05/2021 for the evaluation of Elevated troponin at the request of Dr. Johnney Killian.  Admitted 03/2020 after syncope.  Stress test was abnormal.  Cardiac cath showed multivessel CAD.  Patient declined CABG.  Underwent successful PCI/DESx1 to mRCA, and DESx1 to p/mLAD using IVUS. Plan for DAPT with ASA/plavix for at least one year. Plan to treat Lcx disease medically. Patient wanted to stay on Zocor.   History of Present Illness:   Gary Riggs brought by EMS for being found on floor for unknown period of time.  At baseline, patient is very independent and active.  He lives by himself and still drives.  Daughter who is an Therapist, sports lives in [place] and checks on his father every day.  Last known conversation was Wednesday.  Yesterday she was unable to reach his father on multiple occasion.  Neighbor went to check Gary Riggs this morning around 5:30 AM and found on floor near his bed in bedroom.  Patient reports he did not fall.  Yesterday he felt feverish and chilled.  Felt like he had cold or COVID.  No re-collection of any event.  Patient currently denies chest pain, shortness of breath, orthopnea, PND, lower extremity edema or melena.  Daughter at bedside who provided mostly history.  Patient has chronic right hip pain.  High-sensitivity troponin 4631>> 2574 Lactic acid 2.6>> 6.6 Creatinine and potassium are normal AST 120 CK total 1733 WBC 28 CT of head without acute intracranial abnormality Pending blood culture Right hip x-ray with acute  finding He is started on IV heparin and given Ringer lactate infusion  Plans to start Rocephin   Past Medical History:  Diagnosis Date   Coronary artery disease    Diverticulosis    DJD (degenerative joint disease)    ED (erectile dysfunction)    Hemorrhoids    Hiatal hernia    Hypertension    Onychomycosis    Rosacea    Thyroid disease     Past Surgical History:  Procedure Laterality Date   CORONARY STENT INTERVENTION N/A 03/31/2020   Procedure: CORONARY STENT INTERVENTION;  Surgeon: Jettie Booze, MD;  Location: [facility];  Service: Cardiovascular;  Laterality: N/A;   INTRAVASCULAR ULTRASOUND/IVUS N/A 03/31/2020   Procedure: Intravascular Ultrasound/IVUS;  Surgeon: Jettie Booze, MD;  Location: [facility];  Service: Cardiovascular;  Laterality: N/A;   LEFT HEART CATH N/A 03/31/2020   Procedure: Left Heart Cath;  Surgeon: Jettie Booze, MD;  Location: [facility];  Service: Cardiovascular;  Laterality: N/A;   LEFT HEART CATH AND CORONARY ANGIOGRAPHY N/A 03/30/2020   Procedure: LEFT HEART CATH AND CORONARY ANGIOGRAPHY;  Surgeon: Nelva Bush, MD;  Location: [facility];  Service: Cardiovascular;  Laterality: N/A;    Inpatient Medications: Scheduled Meds:  Continuous Infusions:  cefTRIAXone (ROCEPHIN)  IV     heparin 1,000 Units/hr (04/24/2021 1207)   lactated ringers     lactated ringers 150 mL/hr at 04/27/2021 0931   PRN Meds:   Allergies:    Allergies  Allergen Reactions   Advil [Ibuprofen] Shortness Of Breath and Swelling   Aleve [Naproxen Sodium] Other (See Comments)    angioedema   Tussionex Pennkinetic Er [Hydrocod Polst-Cpm Polst Er] Swelling and Other (See Comments)    Face swelling   Synthroid [Levothyroxine Sodium] Other (See Comments)    Not tolerated name brand    Social History:   Social History   Socioeconomic History   Marital status: Married    Spouse name: Not on file   Number of  children: Not on file   Years of education: Not on file   Highest education level: Not on file  Occupational History   Not on file  Tobacco Use   Smoking status: Never   Smokeless tobacco: Never  Vaping Use   Vaping Use: Never used  Substance and Sexual Activity   Alcohol use: No   Drug use: No   Sexual activity: Not on file  Other Topics Concern   Not on file  Social History Narrative   Not on file   Social Determinants of Health   Financial Resource Strain: Not on file  Food Insecurity: Not on file  Transportation Needs: Not on file  Physical Activity: Not on file  Stress: Not on file  Social Connections: Not on file  Intimate Partner Violence: Not on file    Family History:   No family hx of CAd  ROS:  Please see the history of present illness.  All other ROS reviewed and negative.     Physical Exam/Data:   Vitals:   04/12/2021 1200 04/21/2021 1300 04/21/2021 1315 05/02/2021 1330  BP: (!) 167/86 (!) 153/82  (!) 162/76  Pulse: 84 96 80 85  Resp: 20  (!) 24 (!) 25  Temp:      SpO2: 90% 98% 93% 95%  Weight:      Height:       No intake or output data in the 24 hours ending 04/14/2021 1410 Last 3 Weights 04/29/2021 10/22/2020 07/24/2020  Weight (lbs) 185 lb 189 lb 194 lb 3.2 oz  Weight (kg) 83.915 kg 85.73 kg 88.089 kg     Body mass index is 24.41 kg/m.  General:  Ill appearing male in no acute distress HEENT: normal Neck: no JVD Vascular: No carotid bruits; Distal pulses 2+ bilaterally Cardiac:  normal S1, S2; RRR; no murmur  Lungs:  clear to auscultation bilaterally, no wheezing, rhonchi or rales  Abd: soft, nontender, no hepatomegaly  Ext: R LE edema with erythremia Musculoskeletal:  No deformities, BUE and BLE strength normal and equal Skin: warm and dry  Neuro:  CNs 2-12 intact, no focal abnormalities noted Psych:  Normal affect   EKG:  The EKG was personally reviewed and demonstrates: Sinus rhythm with ST depression in lead V4 to V6 Telemetry:  Telemetry  was personally reviewed and demonstrates:  Sinus rhythm  Relevant CV Studies:  Cath: 03/30/20   Conclusions: Severe three-vessel coronary artery disease, including 80-95% proximal/mid LAD, ostial and mid LCx, and mid RCA stenoses.  The proximal/mid LAD and ostial LCx lesions are heavily calcified. Normal left ventricular systolic function and filling pressure. Possible mild aortic stenosis.   Recommendations: Given unexplained syncope and recent high-risk stress test, we will admit Gary Riggs for cardiac surgery consultation for CABG.  If he is not a reasonable candidate for surgery or Gary Riggs declines CABG, multivessel PCI could be considered before discharge (this would likely require atherectomy of the LAD and ostial LCx). Obtain echocardiogram. Aggressive secondary  prevention.   Nelva Bush, MD [facility]   Diagnostic Dominance: Right   Cath: 03/31/20   Ost Cx to Prox Cx lesion is 90% stenosed. Mid Cx lesion is 95% stenosed. Mid RCA lesion is 80% stenosed. A drug-eluting stent was successfully placed using a STENT RESOLUTE ONYX 3.5X15, postdilated to 3.9 mm and optimized with IVUS. Post intervention, there is a 0% residual stenosis. Prox LAD to Mid LAD lesion is 90% stenosed. A drug-eluting stent was successfully placed using a Eddystone 2.42A83, postdilated to 3.3 mm and optimized with IVUS. Post intervention, there is a 0% residual stenosis. LV end diastolic pressure is normal. There is mild aortic valve stenosis.   Continue dual antiplatelet therapy along with aggressive secondary prevention.  Consider clopidogrel monotherapy after DAPT.     Medical management of complex circumflex disease.    Diagnostic Dominance: Right Intervention     Echo: 03/30/20   IMPRESSIONS     1. Left ventricular ejection fraction, by estimation, is 55 to 60%. The  left ventricle has normal function. The left ventricle has no regional  wall motion abnormalities.  There is severe concentric left ventricular  hypertrophy. Left ventricular diastolic   parameters are consistent with Grade II diastolic dysfunction  (pseudonormalization).   2. Right ventricular systolic function is normal. The right ventricular  size is normal. There is mildly elevated pulmonary artery systolic  pressure.   3. Left atrial size was moderately dilated.   4. Right atrial size was mildly dilated.   5. Eccentric MR with horizontal splay; likely underestimation of  regurgitation. The mitral valve is grossly normal. Mild mitral valve  regurgitation.   6. The aortic valve is tricuspid. There is moderate calcification of the  aortic valve. Aortic valve regurgitation is not visualized. Mild aortic  valve sclerosis is present, with no evidence of aortic valve stenosis.  Aortic valve area, by VTI measures  1.78 cm. Aortic valve mean gradient measures 8.3 mmHg. Aortic valve Vmax  measures 1.91 m/s.   7. There is borderline dilatation of the ascending aorta, measuring 39  mm.   8. The inferior vena cava is normal in size with greater than 50%  respiratory variability, suggesting right atrial pressure of 3 mmHg.   Comparison(s): No prior Echocardiogram.   Laboratory Data:  High Sensitivity Troponin:   Recent Labs  Lab 04/17/2021 0920 04/15/2021 1142  TROPONINIHS 4,631* 2,574*     Chemistry Recent Labs  Lab 04/10/2021 0920  NA 131*  K 3.7  CL 97*  CO2 23  GLUCOSE 113*  BUN 25*  CREATININE 1.05  CALCIUM 9.2  GFRNONAA >60  ANIONGAP 11    Recent Labs  Lab 04/19/2021 0920  PROT 7.5  ALBUMIN 3.9  AST 128*  ALT 38  ALKPHOS 50  BILITOT 1.1   Hematology Recent Labs  Lab 05/05/2021 0920  WBC 28.1*  RBC 4.46  HGB 14.2  HCT 43.8  MCV 98.2  MCH 31.8  MCHC 32.4  RDW 13.1  PLT 236    Radiology/Studies:  CT Head Wo Contrast  Result Date: 04/13/2021 CLINICAL DATA:  87 year old male with history of mental status change. Found down on the floor. EXAM: CT HEAD  WITHOUT CONTRAST TECHNIQUE: Contiguous axial images were obtained from the base of the skull through the vertex without intravenous contrast. COMPARISON:  No priors. FINDINGS: Brain: Mild cerebral atrophy. Patchy and confluent areas of decreased attenuation are noted throughout the deep and periventricular white matter of the cerebral hemispheres bilaterally, compatible  with chronic microvascular ischemic disease. No evidence of acute infarction, hemorrhage, hydrocephalus, extra-axial collection or mass lesion/mass effect. Vascular: No hyperdense vessel or unexpected calcification. Skull: Normal. Negative for fracture or focal lesion. Sinuses/Orbits: No acute finding. Other: None. IMPRESSION: 1. No acute intracranial abnormalities. 2. Mild cerebral atrophy with extensive chronic microvascular ischemic changes in the cerebral white matter, as above. Electronically Signed   By: Vinnie Langton M.D.   On: 04/22/2021 09:55   DG Chest Port 1 View  Result Date: 04/18/2021 CLINICAL DATA:  Concern for sepsis EXAM: PORTABLE CHEST 1 VIEW COMPARISON:  Chest x-ray dated November 21, 2017 FINDINGS: Cardiac and mediastinal contours are within normal limits for AP technique. Mild bibasilar opacities. No large pleural effusion or pneumothorax. IMPRESSION: Mild bibasilar opacities, likely atelectasis. Electronically Signed   By: Yetta Glassman M.D.   On: 04/12/2021 09:03   DG HIP UNILAT WITH PELVIS 1V RIGHT  Result Date: 04/27/2021 CLINICAL DATA:  Right hip pain without known injury. EXAM: DG HIP (WITH OR WITHOUT PELVIS) 1V RIGHT COMPARISON:  July 10, 2020. FINDINGS: There is no evidence of hip fracture or dislocation. Severe narrowing and osteophyte formation is seen involving the right hip. IMPRESSION: Severe osteoarthritis of the right hip. No acute abnormality is noted. Electronically Signed   By: Marijo Conception M.D.   On: 04/28/2021 10:04     Assessment and Plan:   NSTEMI with hx of CAD s/p DES to RCA and LAD  03/2020 - Hs-troponin 4631>> 2574 in setting of being down for unknown period of time with elevated lactic acid, WBC and CK total -Patient does have residual disease in circumflex arteries -EKG this admission did showed ST depression in lead V4 to V6 -Given no chest pain no immediate need for ischemic evaluation -Likely infract secondary to unknown downtime and underlying sepsis -Agree with IV heparinization -Repeat echocardiogram -Continue aspirin and Plavix -Continue Crestor 20 mg daily  2.  Right lower extremity swelling and erythema -Concerning for cellulitis  3.  Rhabdomyolysis - Per primary team   4. HLD - No results found for requested labs within last 8760 hours.  - Continue statin   5. R hip pain - Seen by ortho and has pending CT   Risk Assessment/Risk Scores:   TIMI Risk Score for Unstable Angina or Non-ST Elevation MI:   The patient's TIMI risk score is 6, which indicates a 41% risk of all cause mortality, new or recurrent myocardial infarction or need for urgent revascularization in the next 14 days.{   For questions or updates, please contact [facility] Please consult [email or web address] for contact info under    Jarrett Soho, PA  04/11/2021 2:10 PM

## 2021-04-23 NOTE — Procedures (Signed)
Intubation Procedure Note  Gary Riggs  [id number]  1931-02-23  Date:04/24/2021  Time:6:13 PM   Provider Performing:Kadon Andrus E Saidah Kempton    Procedure: Intubation (31500)  Indication(s) Respiratory Failure  Consent Risks of the procedure as well as the alternatives and risks of each were explained to the patient and/or caregiver.  Consent for the procedure was obtained and is signed in the bedside chart   Anesthesia Etomidate and Rocuronium   Time Out Verified patient identification, verified procedure, site/side was marked, verified correct patient position, special equipment/implants available, medications/allergies/relevant history reviewed, required imaging and test results available.   Sterile Technique Usual hand hygeine, masks, and gloves were used   Procedure Description Patient positioned in bed supine.  Sedation given as noted above.  Patient was intubated with endotracheal tube using Glidescope.  View was Grade 1 full glottis .  Moderate secretions pooled in posterior pharynx and were suctioned. Number of attempts was 1.  Colorimetric CO2 detector was consistent with tracheal placement.   Complications/Tolerance None; patient tolerated the procedure well. Chest X-ray is ordered to verify placement.  EBL Minimal   Specimen(s) None    Eliseo Gum MSN, AGACNP-BC [place] for pager 04/30/2021, 6:14 PM

## 2021-04-23 NOTE — ED Notes (Addendum)
Attending also at bedside. ICU at bedside.

## 2021-04-23 NOTE — Consult Note (Signed)
Reason for Consult:Right hip pain Referring Physician: Charlesetta Shanks Time called: 1205 Time at bedside: Gary Riggs is an 87 y.o. male.  HPI: Gary Riggs was brought to the ED after being found on the floor. He c/o right hip pain that has been going on since he was a teen but has gotten especially bad lately. He has flight of ideas and cannot contribute very well to history.  Past Medical History:  Diagnosis Date   Coronary artery disease    Diverticulosis    DJD (degenerative joint disease)    ED (erectile dysfunction)    Hemorrhoids    Hiatal hernia    Hypertension    Onychomycosis    Rosacea    Thyroid disease     Past Surgical History:  Procedure Laterality Date   CORONARY STENT INTERVENTION N/A 03/31/2020   Procedure: CORONARY STENT INTERVENTION;  Surgeon: Jettie Booze, MD;  Location: [facility];  Service: Cardiovascular;  Laterality: N/A;   INTRAVASCULAR ULTRASOUND/IVUS N/A 03/31/2020   Procedure: Intravascular Ultrasound/IVUS;  Surgeon: Jettie Booze, MD;  Location: [facility];  Service: Cardiovascular;  Laterality: N/A;   LEFT HEART CATH N/A 03/31/2020   Procedure: Left Heart Cath;  Surgeon: Jettie Booze, MD;  Location: [facility];  Service: Cardiovascular;  Laterality: N/A;   LEFT HEART CATH AND CORONARY ANGIOGRAPHY N/A 03/30/2020   Procedure: LEFT HEART CATH AND CORONARY ANGIOGRAPHY;  Surgeon: Nelva Bush, MD;  Location: [facility];  Service: Cardiovascular;  Laterality: N/A;    Family History  Family history unknown: Yes    Social History:  reports that he has never smoked. He has never used smokeless tobacco. He reports that he does not drink alcohol and does not use drugs.  Allergies:  Allergies  Allergen Reactions   Aleve [Naproxen Sodium] Other (See Comments)    angioedema   Tussionex Pennkinetic Er [Hydrocod Polst-Cpm Polst Er] Swelling and Other (See Comments)    Face swelling    Synthroid [Levothyroxine Sodium] Other (See Comments)    Not tolerated name brand    Medications: I have reviewed the patient's current medications.  Results for orders placed or performed during the hospital encounter of 05/05/2021 (from the past 48 hour(s))  Resp Panel by RT-PCR (Flu A&B, Covid) Nasopharyngeal Swab     Status: None   Collection Time: 05/05/2021  8:49 AM   Specimen: Nasopharyngeal Swab; Nasopharyngeal(NP) swabs in vial transport medium  Result Value Ref Range   SARS Coronavirus 2 by RT PCR NEGATIVE NEGATIVE    Comment: (NOTE) SARS-CoV-2 target nucleic acids are NOT DETECTED.  The SARS-CoV-2 RNA is generally detectable in upper respiratory specimens during the acute phase of infection. The lowest concentration of SARS-CoV-2 viral copies this assay can detect is 138 copies/mL. A negative result does not preclude SARS-Cov-2 infection and should not be used as the sole basis for treatment or other patient management decisions. A negative result may occur with  improper specimen collection/handling, submission of specimen other than nasopharyngeal swab, presence of viral mutation(s) within the areas targeted by this assay, and inadequate number of viral copies(<138 copies/mL). A negative result must be combined with clinical observations, patient history, and epidemiological information. The expected result is Negative.  Fact Sheet for Patients:  EntrepreneurPulse.com.au  Fact Sheet for Healthcare Providers:  IncredibleEmployment.be  This test is no t yet approved or cleared by the [place] FDA and  has been authorized for detection and/or diagnosis  of SARS-CoV-2 by FDA under an Emergency Use Authorization (EUA). This EUA will remain  in effect (meaning this test can be used) for the duration of the COVID-19 declaration under Section 564(b)(1) of the Act, 21 U.S.C.section 360bbb-3(b)(1), unless the authorization is terminated   or revoked sooner.       Influenza A by PCR NEGATIVE NEGATIVE   Influenza B by PCR NEGATIVE NEGATIVE    Comment: (NOTE) The Xpert Xpress SARS-CoV-2/FLU/RSV plus assay is intended as an aid in the diagnosis of influenza from Nasopharyngeal swab specimens and should not be used as a sole basis for treatment. Nasal washings and aspirates are unacceptable for Xpert Xpress SARS-CoV-2/FLU/RSV testing.  Fact Sheet for Patients: EntrepreneurPulse.com.au  Fact Sheet for Healthcare Providers: IncredibleEmployment.be  This test is not yet approved or cleared by the [place] FDA and has been authorized for detection and/or diagnosis of SARS-CoV-2 by FDA under an Emergency Use Authorization (EUA). This EUA will remain in effect (meaning this test can be used) for the duration of the COVID-19 declaration under Section 564(b)(1) of the Act, 21 U.S.C. section 360bbb-3(b)(1), unless the authorization is terminated or revoked.  Performed at [facility], [place] [address], [place], [place] [address]   Lactic acid, plasma     Status: Abnormal   Collection Time: 04/26/2021  9:20 AM  Result Value Ref Range   Lactic Acid, Venous 2.6 (HH) 0.5 - 1.9 mmol/L    Comment: CRITICAL RESULT CALLED TO, READ BACK BY AND VERIFIED WITH: Jacqulyn Liner, RN [phone] 04/08/2021 L. KLAR Performed at [facility], [place] [address] [place], [place] [address]   Comprehensive metabolic panel     Status: Abnormal   Collection Time: 05/02/2021  9:20 AM  Result Value Ref Range   Sodium 131 (L) 135 - 145 mmol/L   Potassium 3.7 3.5 - 5.1 mmol/L   Chloride 97 (L) 98 - 111 mmol/L   CO2 23 22 - 32 mmol/L   Glucose, Bld 113 (H) 70 - 99 mg/dL    Comment: Glucose reference range applies only to samples taken after fasting for at least 8 hours.   BUN 25 (H) 8 - 23 mg/dL   Creatinine, Ser 1.05 0.61 - 1.24 mg/dL   Calcium 9.2 8.9 - 10.3 mg/dL   Total Protein 7.5 6.5 - 8.1 g/dL   Albumin  3.9 3.5 - 5.0 g/dL   AST 128 (H) 15 - 41 U/L   ALT 38 0 - 44 U/L   Alkaline Phosphatase 50 38 - 126 U/L   Total Bilirubin 1.1 0.3 - 1.2 mg/dL   GFR, Estimated >60 >60 mL/min    Comment: (NOTE) Calculated using the CKD-EPI Creatinine Equation (2021)    Anion gap 11 5 - 15    Comment: Performed at [facility] [address] [place], [place] [address]  CBC WITH DIFFERENTIAL     Status: Abnormal   Collection Time: 04/06/2021  9:20 AM  Result Value Ref Range   WBC 28.1 (H) 4.0 - 10.5 K/uL   RBC 4.46 4.22 - 5.81 MIL/uL   Hemoglobin 14.2 13.0 - 17.0 g/dL   HCT 43.8 39.0 - 52.0 %   MCV 98.2 80.0 - 100.0 fL   MCH 31.8 26.0 - 34.0 pg   MCHC 32.4 30.0 - 36.0 g/dL   RDW 13.1 11.5 - 15.5 %   Platelets 236 150 - 400 K/uL   nRBC 0.0 0.0 - 0.2 %   Neutrophils Relative %  91 %   Neutro Abs 25.6 (H) 1.7 - 7.7 K/uL   Lymphocytes Relative 3 %   Lymphs Abs 0.7 0.7 - 4.0 K/uL   Monocytes Relative 5 %   Monocytes Absolute 1.5 (H) 0.1 - 1.0 K/uL   Eosinophils Relative 0 %   Eosinophils Absolute 0.0 0.0 - 0.5 K/uL   Basophils Relative 0 %   Basophils Absolute 0.1 0.0 - 0.1 K/uL   WBC Morphology TOXIC GRANULATION     Comment: VACUOLATED NEUTROPHILS   RBC Morphology MORPHOLOGY UNREMARKABLE    Smear Review MORPHOLOGY UNREMARKABLE    Immature Granulocytes 1 %   Abs Immature Granulocytes 0.22 (H) 0.00 - 0.07 K/uL    Comment: Performed at [facility] [address], [place], [place] [address]  Protime-INR     Status: Abnormal   Collection Time: 04/12/2021  9:20 AM  Result Value Ref Range   Prothrombin Time 15.6 (H) 11.4 - 15.2 seconds   INR 1.2 0.8 - 1.2    Comment: (NOTE) INR goal varies based on device and disease states. Performed at [facility], [place] [address] [place], [place] [address]   APTT     Status: None   Collection Time: 05/02/2021  9:20 AM  Result Value Ref Range   aPTT 30 24 - 36 seconds    Comment: Performed at [facility] [address]  [place], [place] [address]  Troponin I (High Sensitivity)     Status: Abnormal   Collection Time: 05/03/2021  9:20 AM  Result Value Ref Range   Troponin I (High Sensitivity) 4,631 (HH) <18 ng/L    Comment: CRITICAL RESULT CALLED TO, READ BACK BY AND VERIFIED WITH: Jacqulyn Liner, RN [phone] 04/16/2021 L. KLAR (NOTE) Elevated high sensitivity troponin I (hsTnI) values and significant  changes across serial measurements may suggest ACS but many other  chronic and acute conditions are known to elevate hsTnI results.  Refer to the Links section for chest pain algorithms and additional  guidance. Performed at [facility], [place] [address], [place], [place] [address]     CT Head Wo Contrast  Result Date: 04/16/2021 CLINICAL DATA:  87 year old male with history of mental status change. Found down on the floor. EXAM: CT HEAD WITHOUT CONTRAST TECHNIQUE: Contiguous axial images were obtained from the base of the skull through the vertex without intravenous contrast. COMPARISON:  No priors. FINDINGS: Brain: Mild cerebral atrophy. Patchy and confluent areas of decreased attenuation are noted throughout the deep and periventricular white matter of the cerebral hemispheres bilaterally, compatible with chronic microvascular ischemic disease. No evidence of acute infarction, hemorrhage, hydrocephalus, extra-axial collection or mass lesion/mass effect. Vascular: No hyperdense vessel or unexpected calcification. Skull: Normal. Negative for fracture or focal lesion. Sinuses/Orbits: No acute finding. Other: None. IMPRESSION: 1. No acute intracranial abnormalities. 2. Mild cerebral atrophy with extensive chronic microvascular ischemic changes in the cerebral white matter, as above. Electronically Signed   By: Vinnie Langton M.D.   On: 04/21/2021 09:55   DG Chest Port 1 View  Result Date: 04/26/2021 CLINICAL DATA:  Concern for sepsis EXAM: PORTABLE CHEST 1 VIEW COMPARISON:  Chest x-ray dated November 21, 2017 FINDINGS: Cardiac  and mediastinal contours are within normal limits for AP technique. Mild bibasilar opacities. No large pleural effusion or pneumothorax. IMPRESSION: Mild bibasilar opacities, likely atelectasis. Electronically Signed   By: Yetta Glassman M.D.   On: 04/14/2021 09:03   DG HIP UNILAT WITH PELVIS 1V RIGHT  Result Date: 04/16/2021 CLINICAL DATA:  Right hip pain without known injury. EXAM: DG HIP (WITH OR WITHOUT PELVIS) 1V RIGHT COMPARISON:  July 10, 2020. FINDINGS: There is no evidence of hip fracture or dislocation. Severe narrowing and osteophyte formation is seen involving the right hip. IMPRESSION: Severe osteoarthritis of the right hip. No acute abnormality is noted. Electronically Signed   By: Marijo Conception M.D.   On: 04/14/2021 10:04    Review of Systems  Unable to perform ROS: Dementia  Blood pressure (!) 167/86, pulse 84, temperature (!) 97.5 F (36.4 C), resp. rate 20, height 6\' 1"  (1.854 m), weight 83.9 kg, SpO2 90 %. Physical Exam Constitutional:      General: He is not in acute distress.    Appearance: He is well-developed. He is not diaphoretic.  HENT:     Head: Normocephalic and atraumatic.  Eyes:     General: No scleral icterus.       Right eye: No discharge.        Left eye: No discharge.     Conjunctiva/sclera: Conjunctivae normal.  Cardiovascular:     Rate and Rhythm: Normal rate and regular rhythm.  Pulmonary:     Effort: Pulmonary effort is normal. No respiratory distress.  Musculoskeletal:     Cervical back: Normal range of motion.     Comments: RLE No traumatic wounds or ecchymosis, erythema over lateral hip, anterior lower leg  Nontender to palp, mod pain with hip PROM  No knee or ankle effusion  Knee stable to varus/ valgus and anterior/posterior stress  Sens DPN, SPN, TN intact  Motor EHL, ext, flex, evers grossly 5/5  DP 1+, PT 1+, No significant edema  Skin:    General: Skin is warm and dry.  Neurological:     Mental Status: He is alert.   Psychiatric:        Mood and Affect: Mood normal.        Behavior: Behavior normal.    Assessment/Plan: Right hip pain -- Will get CT to r/o occult fx, hip joint effusion, and abscess.    Lisette Abu, PA-C Orthopedic Surgery [phone] 04/26/2021, 12:55 PM

## 2021-04-23 NOTE — Consult Note (Signed)
NAME:  Gary Riggs, MRN:  [id number], DOB:  02/16/1931, LOS: 0 ADMISSION DATE:  04/07/2021, CONSULTATION DATE:  04/30/2021 REFERRING MD:  Dyanne Iha - TRH, CHIEF COMPLAINT:  respiratory distress    History of Present Illness:  Gary Riggs, is a 87 y.o. male, who presented to the [facility] ED with a chief complaint of AMS  They have a pertinent past medical history of CAD S/P PTCA in Octover 2021, HTN, BPH, Hypothyroidism, hiatal hernia, DJD.    Her developed AMS and new confusion on 11/17 per daughter. Neighbors checked on him on the AM of 11/18 and he was found lying in the floor. EMS was called.   ED course was notable for a rising lactate 2.6>6.6, CK 1700, NA 131, COVID and FLU -, room air., WBC 28k. IV antibiotics were started per sepsis protocol. Cardiology was consulted for an EKG with lateral ST depressions. Troponin [id number]. A heparin GTT was started.   Hospitalists intented to admit the patient. However he developed respiratory distress while in the ED. CXR demonstrated a large left infiltrated. He was intubated and bronched in the the ED.   PCCM was consulted for assistance with acute respiratory failure.   Pertinent  Medical History  CAD  BPH Hypothyroidism DJD HTN Thyroid disease   Significant Hospital Events: Including procedures, antibiotic start and stop dates in addition to other pertinent events   11/18 to ED after found down. Admitted to [facility] with metabolic encephalopathy, suspected severe sepsis, mild rhabdo. Developed NSTEMI in ED. Then significant and abrupt resp failure requiring CCM consult in   Interim History / Subjective:  Significant respiratory decompensation now hypoxic, abdominal muscle use, trap use.   Daughter at bedside   Objective   Blood pressure 94/72, pulse 73, temperature (!) 97.5 F (36.4 C), resp. rate (!) 23, height _0  (1.854 m), weight 83.9 kg, SpO2 (!) 75 %.        Intake/Output Summary (Last 24 hours) at 04/19/2021 1816 Last  data filed at 04/29/2021 1803 Gross per 24 hour  Intake 538.76 ml  Output --  Net 538.76 ml   Filed Weights   05/03/2021 0816  Weight: 83.9 kg    Examination: General: Critically ill thin elderly M in extremis with respiratory distress  HENT: NCAT pink mm anicteric sclera  Lungs: Dense right sided rhonchi. Accessory muscle use. Symmetrical chest expansion  Cardiovascular: tachycardic s1s2 cap strong central pulses 1+ radial pulses Abdomen: protuberant soft ndnt  Extremities: Cool to touch. Slightly dusky distal extremities. No edema Neuro: Lethargic awakens to voice oriented x2 following commands  GU: condom cath, dark amber urine  Skin: RLE erythematous  Resolved Hospital Problem list     Assessment & Plan:   Acute metabolic encephalopathy -found down at home, altered. ?rhabdo, dehydration, hypoxia, sepsis P -correct metabolic abnormalities as able   Acute respiratory failure with hypoxemia Suspected aspiration PNA -asymmetrical edema possible, less favored given pretty normal appearing EF on POCUS P -intubate now -CXR, ABG -bronch, BAL -RVP  -Unasyn  -VAP PAD  Septic shock -possible compounded by medications and hypovolemia  -favor respiratory source, possible RLE cellulitis, or other  -toxic granulation on diff P -broad abx  -BAL, Ucx, Bcx  -peripheral levo  -mIVF   Demand ischemia  -trops downtrending Hx CAD  P -cards following -ECHO -heparin -ASA Plavix   Lactic acidosis -in setting of shock P -mIVF, trend   Rhabdomyolysis  -down at home. R hip redness P -trend CK  -IVF -follow  renal function, UOP   Hx HTN P -hold home antihypertensives in setting of shock   Best Practice (right click and "Reselect all SmartList Selections" daily)   Diet/type: NPO DVT prophylaxis: systemic heparin GI prophylaxis: PPI Lines: N/A Foley:  N/A Code Status:  full code Last date of multidisciplinary goals of care discussion [11/18. Daughter updated --  overwhelmed by acute decompensation, full code full scope at present but continue an open dialogue as situation unfolds. Has been on a steady decline since 2021 hospitalization   Labs   CBC: Recent Labs  Lab 04/13/2021 0920  WBC 28.1*  NEUTROABS 25.6*  HGB 14.2  HCT 43.8  MCV 98.2  PLT 106    Basic Metabolic Panel: Recent Labs  Lab 04/20/2021 0920  NA 131*  K 3.7  CL 97*  CO2 23  GLUCOSE 113*  BUN 25*  CREATININE 1.05  CALCIUM 9.2   GFR: Estimated Creatinine Clearance: 55 mL/min (by C-G formula based on SCr of 1.05 mg/dL). Recent Labs  Lab 04/17/2021 0920 04/06/2021 1142  WBC 28.1*  --   LATICACIDVEN 2.6* 6.6*    Liver Function Tests: Recent Labs  Lab 04/14/2021 0920  AST 128*  ALT 38  ALKPHOS 50  BILITOT 1.1  PROT 7.5  ALBUMIN 3.9   No results for input(s): LIPASE, AMYLASE in the last 168 hours. No results for input(s): AMMONIA in the last 168 hours.  ABG No results found for: PHART, PCO2ART, PO2ART, HCO3, TCO2, ACIDBASEDEF, O2SAT   Coagulation Profile: Recent Labs  Lab 04/28/2021 0920  INR 1.2    Cardiac Enzymes: Recent Labs  Lab 04/06/2021 1120  CKTOTAL 1,733*    HbA1C: No results found for: HGBA1C  CBG: No results for input(s): GLUCAP in the last 168 hours.  Review of Systems:   Unable to obtain due to patient extremis   Past Medical History:  He,  has a past medical history of Coronary artery disease, Diverticulosis, DJD (degenerative joint disease), ED (erectile dysfunction), Hemorrhoids, Hiatal hernia, Hypertension, Onychomycosis, Rosacea, and Thyroid disease.   Surgical History:   Past Surgical History:  Procedure Laterality Date   CORONARY STENT INTERVENTION N/A 03/31/2020   Procedure: CORONARY STENT INTERVENTION;  Surgeon: Jettie Booze, MD;  Location: [facility];  Service: Cardiovascular;  Laterality: N/A;   INTRAVASCULAR ULTRASOUND/IVUS N/A 03/31/2020   Procedure: Intravascular Ultrasound/IVUS;  Surgeon: Jettie Booze, MD;  Location: [facility];  Service: Cardiovascular;  Laterality: N/A;   LEFT HEART CATH N/A 03/31/2020   Procedure: Left Heart Cath;  Surgeon: Jettie Booze, MD;  Location: [facility];  Service: Cardiovascular;  Laterality: N/A;   LEFT HEART CATH AND CORONARY ANGIOGRAPHY N/A 03/30/2020   Procedure: LEFT HEART CATH AND CORONARY ANGIOGRAPHY;  Surgeon: Nelva Bush, MD;  Location: [facility];  Service: Cardiovascular;  Laterality: N/A;     Social History:   reports that he has never smoked. He has never used smokeless tobacco. He reports that he does not drink alcohol and does not use drugs.   Family History:  His Family history is unknown by patient.   Allergies Allergies  Allergen Reactions   Advil [Ibuprofen] Shortness Of Breath and Swelling   Aleve [Naproxen Sodium] Other (See Comments)    angioedema   Tussionex Pennkinetic Er [Hydrocod Polst-Cpm Polst Er] Swelling and Other (See Comments)    Face swelling   Synthroid [Levothyroxine Sodium] Other (See Comments)    Not tolerated name brand  Home Medications  Prior to Admission medications   Medication Sig Start Date End Date Taking? Authorizing Provider  Ascorbic Acid (VITAMIN C) 1000 MG tablet Take 1,000 mg by mouth daily.   Yes [provider]  aspirin 81 MG chewable tablet Chew 81 mg by mouth daily.    Yes [provider]  Cholecalciferol (VITAMIN D-3) 1000 UNITS CAPS Take 1,000 Units by mouth daily.    Yes [provider]  clopidogrel (PLAVIX) 75 MG tablet Take 1 tablet by mouth once daily with breakfast 03/16/21  Yes Buford Dresser, MD  Cyanocobalamin (VITAMIN B-12 PO) Take 2,500 mcg by mouth every other day.   Yes [provider]  GLUCOS-CHONDROIT-MSM-C-HYAL PO Take 1 tablet by mouth daily. 1500/1200   Yes [provider]  levothyroxine (SYNTHROID) 137 MCG tablet Take 137 mcg by mouth daily before breakfast.  12/13/19  Yes  [provider]  losartan-hydrochlorothiazide (HYZAAR) 100-12.5 MG per tablet Take 1 tablet by mouth daily.   Yes [provider]  Multiple Minerals-Vitamins (CALCIUM CITRATE-MAG-MINERALS PO) Take 1 tablet by mouth daily. Vit D3/ Zinc   Yes [provider]  Multiple Vitamin (MULTIVITAMIN) capsule Take 1 capsule by mouth daily.   Yes [provider]  nitroGLYCERIN (NITROSTAT) 0.4 MG SL tablet Place 0.4 mg under the tongue every 5 (five) minutes as needed for chest pain.   Yes [provider]  rosuvastatin (CRESTOR) 20 MG tablet TAKE 1 TABLET EVERY DAY (REPLACES SIMVASTATIN) Patient taking differently: Take 20 mg by mouth daily. 03/30/21  Yes Buford Dresser, MD  tamsulosin (FLOMAX) 0.4 MG CAPS capsule Take 0.4 mg by mouth at bedtime.    Yes [provider]     Critical care time: 50 min     CRITICAL CARE Performed by: Cristal Generous   Total critical care time: 50 minutes  Critical care time was exclusive of separately billable procedures and treating other patients. Critical care was necessary to treat or prevent imminent or life-threatening deterioration.  Critical care was time spent personally by me on the following activities: development of treatment plan with patient and/or surrogate as well as nursing, discussions with consultants, evaluation of patient's response to treatment, examination of patient, obtaining history from patient or surrogate, ordering and performing treatments and interventions, ordering and review of laboratory studies, ordering and review of radiographic studies, pulse oximetry and re-evaluation of patient's condition.  Eliseo Gum MSN, AGACNP-BC [place] for pager  04/08/2021, 6:36 PM

## 2021-04-23 NOTE — Significant Event (Signed)
Addendum: Please see H&P from earlier for full details.  Patient been receiving IV antibiotics, IV fluids at 150 mill per hour per sepsis protocol.  Admission orders (signed and held) not released yet as patient still in ED.  Alerted by cardiologist who came by to see patient that he has significantly deteriorated with now SBP in the 70s, tachycardic with heart rate 110s.  O2 sat dropped down to 80s requiring NRBM. I went back to reevaluate patient, noted to be in respiratory distress, accessory muscle use, upper airway sounds and mild left basilar crackles on chest exam.  Mentation seems same as before and responding to questions.  ED physician and cardiologist at bedside.  Requested to obtain stat chest x-ray, ABG, hold fluids and initiate pressors.  Doppler lower extremity now resulted and negative for DVT.  Contacted PCCM for urgent consult-they are now at bedside and taking over care. Daughter at bedside and has confirmed full CODE STATUS.

## 2021-04-23 NOTE — Progress Notes (Addendum)
[facility] Progress Note Patient Name: Gary Riggs DOB: August 31, 1930 MRN: [id number]   Date of Service  05/04/2021  HPI/Events of Note  Worsening hypotension, likely related to sepsis +/- cardiogenic shock. Central line placed. CXR reviewed for placement of line. CVP 14  eICU Interventions  Changed levophed for central access. Added vasopressin Will follow-up SVO2     Intervention Category Major Interventions: Hypotension - evaluation and management;Procedure - evaluation and supervision  Tamberlyn Midgley Rodman Pickle 04/10/2021, 9:34 PM

## 2021-04-23 NOTE — ED Notes (Signed)
Patient transported to CT 

## 2021-04-23 NOTE — ED Notes (Addendum)
Patient placed on [place] then NONrebreather for 02 sats 70's. RT called to bedside, cardiology at bedside. Nailbeds are cool and slightly dusky.

## 2021-04-23 NOTE — ED Triage Notes (Signed)
Was found on floor, states did not fall, but has arthritis and could not get into bed so laid down on floor. Per daughter, patient has had "rambling speech" saying random things. No stroke symptoms per EMS.

## 2021-04-23 NOTE — Progress Notes (Signed)
ANTICOAGULATION CONSULT NOTE - Initial Consult  Pharmacy Consult for heparin Indication: chest pain/ACS  Allergies  Allergen Reactions   Aleve [Naproxen Sodium] Other (See Comments)    angioedema   Tussionex Pennkinetic Er [Hydrocod Polst-Cpm Polst Er] Swelling and Other (See Comments)    Face swelling   Synthroid [Levothyroxine Sodium] Other (See Comments)    Not tolerated name brand    Patient Measurements: Height: 6\' 1"  (185.4 cm) Weight: 83.9 kg (185 lb) IBW/kg (Calculated) : 79.9 Heparin Dosing Weight: TBW  Vital Signs: Temp: 97.5 F (36.4 C) (11/18 0815) BP: 149/71 (11/18 0930) Pulse Rate: 72 (11/18 0930)  Labs: Recent Labs    04/09/2021 0920  HGB 14.2  HCT 43.8  PLT 236  APTT 30  LABPROT 15.6*  INR 1.2  CREATININE 1.05  TROPONINIHS 4,631*    Estimated Creatinine Clearance: 55 mL/min (by C-G formula based on SCr of 1.05 mg/dL).   Medical History: Past Medical History:  Diagnosis Date   Coronary artery disease    Diverticulosis    DJD (degenerative joint disease)    ED (erectile dysfunction)    Hemorrhoids    Hiatal hernia    Hypertension    Onychomycosis    Rosacea    Thyroid disease     Assessment: 21 YOM presenting after found on floor, elevated troponin, he is not on anticoagulation PTA, CBC wnl  Goal of Therapy:  Heparin level 0.3-0.7 units/ml Monitor platelets by anticoagulation protocol: Yes   Plan:  Heparin 4000 units IV x 1, and gtt at 1000 units/hr F/u 8 hour heparin level F/u cards eval and recs  Bertis Ruddy, PharmD Clinical Pharmacist ED Pharmacist Phone # [phone] 04/13/2021 11:41 AM

## 2021-04-24 ENCOUNTER — Inpatient Hospital Stay (HOSPITAL_COMMUNITY): Payer: Medicare HMO

## 2021-04-24 DIAGNOSIS — R6521 Severe sepsis with septic shock: Secondary | ICD-10-CM

## 2021-04-24 DIAGNOSIS — I214 Non-ST elevation (NSTEMI) myocardial infarction: Secondary | ICD-10-CM | POA: Diagnosis not present

## 2021-04-24 DIAGNOSIS — T796XXA Traumatic ischemia of muscle, initial encounter: Secondary | ICD-10-CM | POA: Diagnosis not present

## 2021-04-24 DIAGNOSIS — G9341 Metabolic encephalopathy: Secondary | ICD-10-CM | POA: Diagnosis not present

## 2021-04-24 DIAGNOSIS — R9431 Abnormal electrocardiogram [ECG] [EKG]: Secondary | ICD-10-CM | POA: Diagnosis not present

## 2021-04-24 DIAGNOSIS — A419 Sepsis, unspecified organism: Secondary | ICD-10-CM | POA: Diagnosis not present

## 2021-04-24 DIAGNOSIS — J9601 Acute respiratory failure with hypoxia: Secondary | ICD-10-CM | POA: Diagnosis not present

## 2021-04-24 LAB — CBC WITH DIFFERENTIAL/PLATELET
Abs Immature Granulocytes: 0.29 10*3/uL — ABNORMAL HIGH (ref 0.00–0.07)
Basophils Absolute: 0.1 10*3/uL (ref 0.0–0.1)
Basophils Relative: 0 %
Eosinophils Absolute: 0 10*3/uL (ref 0.0–0.5)
Eosinophils Relative: 0 %
HCT: 43.5 % (ref 39.0–52.0)
Hemoglobin: 14 g/dL (ref 13.0–17.0)
Immature Granulocytes: 1 %
Lymphocytes Relative: 6 %
Lymphs Abs: 1.5 10*3/uL (ref 0.7–4.0)
MCH: 31.8 pg (ref 26.0–34.0)
MCHC: 32.2 g/dL (ref 30.0–36.0)
MCV: 98.9 fL (ref 80.0–100.0)
Monocytes Absolute: 1.3 10*3/uL — ABNORMAL HIGH (ref 0.1–1.0)
Monocytes Relative: 5 %
Neutro Abs: 22.6 10*3/uL — ABNORMAL HIGH (ref 1.7–7.7)
Neutrophils Relative %: 88 %
Platelets: 238 10*3/uL (ref 150–400)
RBC: 4.4 MIL/uL (ref 4.22–5.81)
RDW: 13.2 % (ref 11.5–15.5)
WBC: 25.8 10*3/uL — ABNORMAL HIGH (ref 4.0–10.5)
nRBC: 0 % (ref 0.0–0.2)

## 2021-04-24 LAB — MRSA NEXT GEN BY PCR, NASAL: MRSA by PCR Next Gen: NOT DETECTED

## 2021-04-24 LAB — RESPIRATORY PANEL BY PCR

## 2021-04-24 LAB — ECHOCARDIOGRAM COMPLETE
AR max vel: 0.6 cm2
AV Area VTI: 0.57 cm2
AV Area mean vel: 0.65 cm2
AV Mean grad: 31 mmHg
AV Peak grad: 53.3 mmHg
Ao pk vel: 3.65 m/s
Height: 73 in
S' Lateral: 3.9 cm
Weight: 2917.13 oz

## 2021-04-24 LAB — POCT I-STAT 7, (LYTES, BLD GAS, ICA,H+H)
Acid-base deficit: 10 mmol/L — ABNORMAL HIGH (ref 0.0–2.0)
Acid-base deficit: 5 mmol/L — ABNORMAL HIGH (ref 0.0–2.0)
Acid-base deficit: 8 mmol/L — ABNORMAL HIGH (ref 0.0–2.0)
Bicarbonate: 15.7 mmol/L — ABNORMAL LOW (ref 20.0–28.0)
Bicarbonate: 19.2 mmol/L — ABNORMAL LOW (ref 20.0–28.0)
Bicarbonate: 17.8 mmol/L — ABNORMAL LOW (ref 20.0–28.0)
Calcium, Ion: 1 mmol/L — ABNORMAL LOW (ref 1.15–1.40)
Calcium, Ion: 1.05 mmol/L — ABNORMAL LOW (ref 1.15–1.40)
Calcium, Ion: 1.03 mmol/L — ABNORMAL LOW (ref 1.15–1.40)
HCT: 43 % (ref 39.0–52.0)
HCT: 41 % (ref 39.0–52.0)
HCT: 38 % — ABNORMAL LOW (ref 39.0–52.0)
Hemoglobin: 14.6 g/dL (ref 13.0–17.0)
Hemoglobin: 13.9 g/dL (ref 13.0–17.0)
Hemoglobin: 12.9 g/dL — ABNORMAL LOW (ref 13.0–17.0)
O2 Saturation: 81 %
O2 Saturation: 84 %
O2 Saturation: 86 %
Patient temperature: 98.4
Patient temperature: 98.6
Patient temperature: 38.9
Potassium: 3.8 mmol/L (ref 3.5–5.1)
Potassium: 4.2 mmol/L (ref 3.5–5.1)
Potassium: 4.8 mmol/L (ref 3.5–5.1)
Sodium: 124 mmol/L — ABNORMAL LOW (ref 135–145)
Sodium: 130 mmol/L — ABNORMAL LOW (ref 135–145)
Sodium: 129 mmol/L — ABNORMAL LOW (ref 135–145)
TCO2: 17 mmol/L — ABNORMAL LOW (ref 22–32)
TCO2: 20 mmol/L — ABNORMAL LOW (ref 22–32)
TCO2: 19 mmol/L — ABNORMAL LOW (ref 22–32)
pCO2 arterial: 31.9 mmHg — ABNORMAL LOW (ref 32.0–48.0)
pCO2 arterial: 33.6 mmHg (ref 32.0–48.0)
pCO2 arterial: 37.8 mmHg (ref 32.0–48.0)
pH, Arterial: 7.299 — ABNORMAL LOW (ref 7.350–7.450)
pH, Arterial: 7.364 (ref 7.350–7.450)
pH, Arterial: 7.289 — ABNORMAL LOW (ref 7.350–7.450)
pO2, Arterial: 49 mmHg — ABNORMAL LOW (ref 83.0–108.0)
pO2, Arterial: 50 mmHg — ABNORMAL LOW (ref 83.0–108.0)
pO2, Arterial: 62 mmHg — ABNORMAL LOW (ref 83.0–108.0)

## 2021-04-24 LAB — BASIC METABOLIC PANEL
Anion gap: 13 (ref 5–15)
Anion gap: 11 (ref 5–15)
BUN: 35 mg/dL — ABNORMAL HIGH (ref 8–23)
BUN: 40 mg/dL — ABNORMAL HIGH (ref 8–23)
CO2: 18 mmol/L — ABNORMAL LOW (ref 22–32)
CO2: 22 mmol/L (ref 22–32)
Calcium: 7.7 mg/dL — ABNORMAL LOW (ref 8.9–10.3)
Calcium: 7.4 mg/dL — ABNORMAL LOW (ref 8.9–10.3)
Chloride: 97 mmol/L — ABNORMAL LOW (ref 98–111)
Chloride: 98 mmol/L (ref 98–111)
Creatinine, Ser: 1.95 mg/dL — ABNORMAL HIGH (ref 0.61–1.24)
Creatinine, Ser: 2.82 mg/dL — ABNORMAL HIGH (ref 0.61–1.24)
GFR, Estimated: 32 mL/min — ABNORMAL LOW (ref >60)
GFR, Estimated: 21 mL/min — ABNORMAL LOW (ref >60)
Glucose, Bld: 252 mg/dL — ABNORMAL HIGH (ref 70–99)
Glucose, Bld: 193 mg/dL — ABNORMAL HIGH (ref 70–99)
Potassium: 3.8 mmol/L (ref 3.5–5.1)
Potassium: 4.1 mmol/L (ref 3.5–5.1)
Sodium: 128 mmol/L — ABNORMAL LOW (ref 135–145)
Sodium: 131 mmol/L — ABNORMAL LOW (ref 135–145)

## 2021-04-24 LAB — COOXEMETRY PANEL
Carboxyhemoglobin: 0.6 % (ref 0.5–1.5)
Methemoglobin: 0.8 % (ref 0.0–1.5)
O2 Saturation: 45.2 %
Total hemoglobin: 12.8 g/dL (ref 12.0–16.0)

## 2021-04-24 LAB — BLOOD CULTURE ID PANEL (REFLEXED) - BCID2

## 2021-04-24 LAB — HEPARIN LEVEL (UNFRACTIONATED)
Heparin Unfractionated: <0.1 IU/mL — ABNORMAL LOW (ref 0.30–0.70)
Heparin Unfractionated: 0.22 IU/mL — ABNORMAL LOW (ref 0.30–0.70)

## 2021-04-24 LAB — PHOSPHORUS: Phosphorus: 3.9 mg/dL (ref 2.5–4.6)

## 2021-04-24 LAB — GLUCOSE, CAPILLARY
Glucose-Capillary: 125 mg/dL — ABNORMAL HIGH (ref 70–99)
Glucose-Capillary: 139 mg/dL — ABNORMAL HIGH (ref 70–99)
Glucose-Capillary: 154 mg/dL — ABNORMAL HIGH (ref 70–99)
Glucose-Capillary: 156 mg/dL — ABNORMAL HIGH (ref 70–99)
Glucose-Capillary: 183 mg/dL — ABNORMAL HIGH (ref 70–99)
Glucose-Capillary: 208 mg/dL — ABNORMAL HIGH (ref 70–99)

## 2021-04-24 LAB — LACTIC ACID, PLASMA
Lactic Acid, Venous: 3.8 mmol/L (ref 0.5–1.9)
Lactic Acid, Venous: 4.3 mmol/L (ref 0.5–1.9)
Lactic Acid, Venous: 3.3 mmol/L (ref 0.5–1.9)
Lactic Acid, Venous: 3.6 mmol/L (ref 0.5–1.9)

## 2021-04-24 LAB — MAGNESIUM: Magnesium: 1.9 mg/dL (ref 1.7–2.4)

## 2021-04-24 LAB — TRIGLYCERIDES: Triglycerides: 55 mg/dL (ref <150)

## 2021-04-24 MED ORDER — SODIUM BICARBONATE 8.4 % IV SOLN
50.0000 meq | Freq: Once | INTRAVENOUS | Status: AC
  Administered 2021-04-24: 50 meq via INTRAVENOUS
  Filled 2021-04-24: qty 50

## 2021-04-24 MED ORDER — LACTATED RINGERS IV BOLUS
1000.0000 mL | Freq: Once | INTRAVENOUS | Status: AC
  Administered 2021-04-24: 1000 mL via INTRAVENOUS

## 2021-04-24 MED ORDER — ORAL CARE MOUTH RINSE
15.0000 mL | OROMUCOSAL | Status: DC
  Administered 2021-04-24 – 2021-04-25 (×13): 15 mL via OROMUCOSAL

## 2021-04-24 MED ORDER — CHLORHEXIDINE GLUCONATE 0.12% ORAL RINSE (MEDLINE KIT)
15.0000 mL | Freq: Two times a day (BID) | OROMUCOSAL | Status: DC
  Administered 2021-04-24 – 2021-04-25 (×3): 15 mL via OROMUCOSAL

## 2021-04-24 MED ORDER — INSULIN ASPART 100 UNIT/ML IJ SOLN
0.0000 [IU] | INTRAMUSCULAR | Status: DC
Start: 2021-04-24 — End: 2021-04-25
  Administered 2021-04-24: 2 [IU] via SUBCUTANEOUS
  Administered 2021-04-24: 3 [IU] via SUBCUTANEOUS
  Administered 2021-04-24: 2 [IU] via SUBCUTANEOUS
  Administered 2021-04-24: 3 [IU] via SUBCUTANEOUS

## 2021-04-24 MED ORDER — SODIUM BICARBONATE 8.4 % IV SOLN
INTRAVENOUS | Status: AC
  Administered 2021-04-24: 100 meq via INTRAVENOUS
  Filled 2021-04-24: qty 50

## 2021-04-24 MED ORDER — SODIUM BICARBONATE 8.4 % IV SOLN
100.0000 meq | Freq: Once | INTRAVENOUS | Status: AC
  Filled 2021-04-24: qty 100

## 2021-04-24 MED ORDER — VANCOMYCIN HCL 1500 MG/300ML IV SOLN: 1500.0000 mg | INTRAVENOUS | Status: DC

## 2021-04-24 MED ORDER — SODIUM CHLORIDE 0.9 % IV SOLN
3.0000 g | Freq: Two times a day (BID) | INTRAVENOUS | Status: DC
  Administered 2021-04-24 – 2021-04-25 (×2): 3 g via INTRAVENOUS
  Filled 2021-04-24 (×2): qty 8

## 2021-04-24 MED ORDER — ASPIRIN 300 MG RE SUPP
300.0000 mg | Freq: Every day | RECTAL | Status: DC
  Administered 2021-04-24 – 2021-04-25 (×2): 300 mg via RECTAL
  Filled 2021-04-24 (×2): qty 1

## 2021-04-24 MED ORDER — FENTANYL BOLUS VIA INFUSION
25.0000 ug | INTRAVENOUS | Status: DC | PRN
  Administered 2021-04-25: 50 ug via INTRAVENOUS
  Filled 2021-04-24: qty 100

## 2021-04-24 MED ORDER — CALCIUM GLUCONATE-NACL 1-0.675 GM/50ML-% IV SOLN
1.0000 g | Freq: Once | INTRAVENOUS | Status: AC
  Administered 2021-04-24: 1000 mg via INTRAVENOUS
  Filled 2021-04-24: qty 50

## 2021-04-24 MED ORDER — DOCUSATE SODIUM 50 MG/5ML PO LIQD
100.0000 mg | Freq: Two times a day (BID) | ORAL | Status: DC
  Administered 2021-04-24 – 2021-04-25 (×3): 100 mg
  Filled 2021-04-24 (×3): qty 10

## 2021-04-24 MED ORDER — SODIUM CHLORIDE 0.9% FLUSH
10.0000 mL | Freq: Two times a day (BID) | INTRAVENOUS | Status: DC
Start: 2021-04-24 — End: 2021-04-25
  Administered 2021-04-24: 30 mL

## 2021-04-24 MED ORDER — SODIUM BICARBONATE 8.4 % IV SOLN
INTRAVENOUS | Status: DC
  Filled 2021-04-24: qty 1000

## 2021-04-24 MED ORDER — SODIUM CHLORIDE 0.9% FLUSH: 10.0000 mL | INTRAVENOUS | Status: DC | PRN

## 2021-04-24 MED ORDER — HYDROCORTISONE SOD SUC (PF) 100 MG IJ SOLR
100.0000 mg | Freq: Two times a day (BID) | INTRAMUSCULAR | Status: DC
  Administered 2021-04-24 (×2): 100 mg via INTRAVENOUS
  Filled 2021-04-24 (×2): qty 2

## 2021-04-24 MED ORDER — POLYETHYLENE GLYCOL 3350 17 G PO PACK
17.0000 g | PACK | Freq: Every day | ORAL | Status: DC
  Administered 2021-04-24 – 2021-04-25 (×2): 17 g
  Filled 2021-04-24 (×2): qty 1

## 2021-04-24 MED ORDER — ACETAMINOPHEN 160 MG/5ML PO SOLN
650.0000 mg | Freq: Four times a day (QID) | ORAL | Status: DC | PRN
  Administered 2021-04-24: 650 mg via ORAL
  Filled 2021-04-24: qty 20.3

## 2021-04-24 NOTE — Plan of Care (Signed)
  Problem: Education: Goal: Knowledge of General Education information will improve Description: Including pain rating scale, medication(s)/side effects and non-pharmacologic comfort measures Outcome: Not Progressing   Problem: Health Behavior/Discharge Planning: Goal: Ability to manage health-related needs will improve Outcome: Not Progressing   Problem: Clinical Measurements: Goal: Ability to maintain clinical measurements within normal limits will improve Outcome: Not Progressing Goal: Diagnostic test results will improve Outcome: Not Progressing   Problem: Nutrition: Goal: Adequate nutrition will be maintained Outcome: Not Progressing

## 2021-04-24 NOTE — Progress Notes (Signed)
Gold colored band removed from left ring finger by RN with MD at bedside and given to daughter at bedside.   Ezequiel Essex, MD

## 2021-04-24 NOTE — Progress Notes (Signed)
Echocardiogram 2D Echocardiogram has been performed.  Oneal Deputy Trinidad Ingle RDCS 04/24/2021, 10:52 AM

## 2021-04-24 NOTE — Progress Notes (Signed)
PHARMACY - PHYSICIAN COMMUNICATION CRITICAL VALUE ALERT - BLOOD CULTURE IDENTIFICATION (BCID)  Gary Riggs is an 87 y.o. male who presented to [facility] on 05/05/2021 with a chief complaint of PNA/rhabdo  Assessment:  Hypotensive, WBC elevated, PNA on CXR  Name of physician (or Provider) Contacted: Dr. Loanne Drilling  Current antibiotics: Vancomycin/Unasyn  Changes to prescribed antibiotics recommended:  No changes for now  Results for orders placed or performed during the hospital encounter of 04/07/2021  Blood Culture ID Panel (Reflexed) (Collected: 04/19/2021  8:47 AM)  Result Value Ref Range   Enterococcus faecalis NOT DETECTED NOT DETECTED   Enterococcus Faecium NOT DETECTED NOT DETECTED   Listeria monocytogenes NOT DETECTED NOT DETECTED   Staphylococcus species NOT DETECTED NOT DETECTED   Staphylococcus aureus (BCID) NOT DETECTED NOT DETECTED   Staphylococcus epidermidis NOT DETECTED NOT DETECTED   Staphylococcus lugdunensis NOT DETECTED NOT DETECTED   Streptococcus species DETECTED (A) NOT DETECTED   Streptococcus agalactiae DETECTED (A) NOT DETECTED   Streptococcus pneumoniae NOT DETECTED NOT DETECTED   Streptococcus pyogenes NOT DETECTED NOT DETECTED   A.calcoaceticus-baumannii NOT DETECTED NOT DETECTED   Bacteroides fragilis NOT DETECTED NOT DETECTED   Enterobacterales NOT DETECTED NOT DETECTED   Enterobacter cloacae complex NOT DETECTED NOT DETECTED   Escherichia coli NOT DETECTED NOT DETECTED   Klebsiella aerogenes NOT DETECTED NOT DETECTED   Klebsiella oxytoca NOT DETECTED NOT DETECTED   Klebsiella pneumoniae NOT DETECTED NOT DETECTED   Proteus species NOT DETECTED NOT DETECTED   Salmonella species NOT DETECTED NOT DETECTED   Serratia marcescens NOT DETECTED NOT DETECTED   Haemophilus influenzae NOT DETECTED NOT DETECTED   Neisseria meningitidis NOT DETECTED NOT DETECTED   Pseudomonas aeruginosa NOT DETECTED NOT DETECTED   Stenotrophomonas maltophilia NOT DETECTED  NOT DETECTED   Candida albicans NOT DETECTED NOT DETECTED   Candida auris NOT DETECTED NOT DETECTED   Candida glabrata NOT DETECTED NOT DETECTED   Candida krusei NOT DETECTED NOT DETECTED   Candida parapsilosis NOT DETECTED NOT DETECTED   Candida tropicalis NOT DETECTED NOT DETECTED   Cryptococcus neoformans/gattii NOT DETECTED NOT DETECTED    [name] 04/24/2021  1:21 AM

## 2021-04-24 NOTE — Progress Notes (Signed)
[facility] Progress Note Patient Name: Gary Riggs DOB: 1931/05/22 MRN: [id number]   Date of Service  04/24/2021  HPI/Events of Note  Increased vasopressor requirement. ABG reviewed with worsening acidosis and persistent hypoxemia.   I spoke with patient's daughter, Gary Riggs, at bedside via video. I updated her regarding patient's critical condition. We discussed GOC and confirmed that she would not want aggressive treatment such as dialysis or CPR however would want to continue medical management with IVF, antibiotics and vasopressor support to give him a chance to recover if possible.   I confirmed with her that we are changing his code status to DNR limited. RN present in room as witness.  eICU Interventions  Changed to DNR-limited - no chest compressions  Start sodium bicarb for acidosis. No dialysis per family        Kelin Borum Rodman Pickle 04/24/2021, 3:24 AM

## 2021-04-24 NOTE — Progress Notes (Signed)
ANTICOAGULATION CONSULT NOTE  Pharmacy Consult for heparin Indication: chest pain/ACS  Allergies  Allergen Reactions   Advil [Ibuprofen] Shortness Of Breath and Swelling   Aleve [Naproxen Sodium] Other (See Comments)    angioedema   Tussionex Pennkinetic Er [Hydrocod Polst-Cpm Polst Er] Swelling and Other (See Comments)    Face swelling   Synthroid [Levothyroxine Sodium] Other (See Comments)    Not tolerated name brand    Patient Measurements: Height: 6\' 1"  (185.4 cm) Weight: 82.7 kg (182 lb 5.1 oz) IBW/kg (Calculated) : 79.9 Heparin Dosing Weight: 82.7  Vital Signs: Temp: 97.2 F (36.2 C) (11/19 1900) Temp Source: Bladder (11/19 1730) BP: 82/50 (11/19 1130) Pulse Rate: 81 (11/19 1730)  Labs: Recent Labs    04/09/2021 0920 05/05/2021 1120 04/11/2021 1142 05/05/2021 1936 04/24/21 0108 04/24/21 0246 04/24/21 0900 04/24/21 1045 04/24/21 1110 04/24/21 1610 04/24/21 1800  HGB 14.2  --   --    < > 14.0 14.6  --   --  13.9 12.9*  --   HCT 43.8  --   --    < > 43.5 43.0  --   --  41.0 38.0*  --   PLT 236  --   --   --  238  --   --   --   --   --   --   APTT 30  --   --   --   --   --   --   --   --   --   --   LABPROT 15.6*  --   --   --   --   --   --   --   --   --   --   INR 1.2  --   --   --   --   --   --   --   --   --   --   HEPARINUNFRC  --   --   --   --   --   --  <0.10*  --   --   --  0.22*  CREATININE 1.05  --   --   --  1.95*  --   --  2.82*  --   --   --   CKTOTAL  --  1,733*  --   --   --   --   --   --   --   --   --   LSLHTDSKAJG 8,115*  --  2,574*  --   --   --   --   --   --   --   --    < > = values in this interval not displayed.     Estimated Creatinine Clearance: 20.5 mL/min (A) (by C-G formula based on SCr of 2.82 mg/dL (H)).   Medical History: Past Medical History:  Diagnosis Date   Coronary artery disease    Diverticulosis    DJD (degenerative joint disease)    ED (erectile dysfunction)    Hemorrhoids    Hiatal hernia    Hypertension     Onychomycosis    Rosacea    Thyroid disease     Assessment: Gary Riggs presenting after found on floor, elevated troponin, he is not on anticoagulation PTA, CBC WNL.  Heparin level is subtherapeutic at 0.22, on 1150 units/hr. No s/sx of bleeding or infusion issues.   Goal of Therapy:  Heparin level 0.3-0.7 units/ml Monitor platelets by anticoagulation protocol: Yes  Plan:  Increase to heparin 1250 units/hr Order heparin level in 8 hr Daily HL and CBC ordered Follow closely for signs/symptoms of bleeding  Thank you for allowing pharmacy to be a part of this patient's care.  Antonietta Jewel, PharmD, [place] Clinical Pharmacist  Phone: [phone] 04/24/2021 7:29 PM  Please check AMION for all [place] phone numbers After 10:00 PM, call [place] [phone]

## 2021-04-24 NOTE — Procedures (Signed)
Arterial Catheter Insertion Procedure Note  Gary Riggs  [id number]  June 09, 1930  Date:04/24/21  Time:1:58 PM    Provider Performing: Jacky Kindle    Procedure: Insertion of Arterial Line [phone]) with US guidance ([id number])   Indication(s) Blood pressure monitoring and/or need for frequent ABGs  Consent Risks of the procedure as well as the alternatives and risks of each were explained to the patient and/or caregiver.  Consent for the procedure was obtained and is signed in the bedside chart  Anesthesia None   Time Out Verified patient identification, verified procedure, site/side was marked, verified correct patient position, special equipment/implants available, medications/allergies/relevant history reviewed, required imaging and test results available.   Sterile Technique Maximal sterile technique including full sterile barrier drape, hand hygiene, sterile gown, sterile gloves, mask, hair covering, sterile ultrasound probe cover (if used).   Procedure Description Area of catheter insertion was cleaned with chlorhexidine and draped in sterile fashion. With real-time ultrasound guidance an arterial catheter was placed into the right  Axillary  artery.  Appropriate arterial tracings confirmed on monitor.     Complications/Tolerance None; patient tolerated the procedure well.   EBL Minimal   Specimen(s) None

## 2021-04-24 NOTE — Progress Notes (Addendum)
NAME:  Gary Riggs, MRN:  [id number], DOB:  Mar 12, 1931, LOS: 1 ADMISSION DATE:  05/04/2021, CONSULTATION DATE:  05/02/2021 REFERRING MD:  Dyanne Iha - TRH, CHIEF COMPLAINT:  respiratory distress    History of Present Illness:  Gary Riggs, is a 87 y.o. male, who presented to the [facility] ED with a chief complaint of AMS.  He developed AMS and new confusion on 11/17 per daughter. Neighbors checked on him on the AM of 11/18 and he was found lying in the floor. EMS was called. ED course was notable for a rising lactate 2.6>6.6, CK 1700, NA 131, COVID and FLU -, room air., WBC 28k. IV antibiotics were started per sepsis protocol. Cardiology was consulted for an EKG with lateral ST depressions. Troponin [id number]. A heparin GTT was started. Hospitalists intented to admit the patient. However he developed respiratory distress while in the ED. CXR demonstrated a large left infiltrated. He was intubated and bronched in the the ED. PCCM was consulted for assistance with acute respiratory failure.   Overnight, patient required 3 pressors to maintain BP. Vanc and unasyn continued. Blood culture positive for Strep agalactiae.   Pertinent  Medical History  CAD  BPH Hypothyroidism DJD HTN Thyroid disease   Significant Hospital Events: Including procedures, antibiotic start and stop dates in addition to other pertinent events   11/18 to ED after found down. Admitted to [facility] with metabolic encephalopathy, suspected severe sepsis, mild rhabdo. Developed NSTEMI in ED. Then significant and abrupt resp failure requiring CCM consult.  11/19: DNR-limited (no compressions). Blood culture positive for Strep agalactiae  Interim History / Subjective:  Patient intubated and sedated. No acute distress. Daughter at bedside, all questions answered.   Objective   Blood pressure 107/74, pulse (!) 104, temperature 99 F (37.2 C), temperature source Oral, resp. rate (!) 24, height 6\' 1"  (1.854 m), weight 82.7 kg, SpO2  96 %. CVP:  [5 mmHg-14 mmHg] 5 mmHg  Vent Mode: PRVC FiO2 (%):  [100 %] 100 % Set Rate:  [24 bmp-28 bmp] 24 bmp Vt Set:  [650 mL] 650 mL PEEP:  [5 cmH20-10 cmH20] 5 cmH20 Plateau Pressure:  [24 cmH20-29 cmH20] 28 cmH20   Intake/Output Summary (Last 24 hours) at 04/24/2021 1227 Last data filed at 04/24/2021 1100 Gross per 24 hour  Intake 5228.76 ml  Output 0 ml  Net 5228.76 ml   Filed Weights   04/22/2021 0816 04/24/21 0500  Weight: 83.9 kg 82.7 kg    Examination: General: Critically ill, thin appearing male, intubated, sedated Cardiac: RRR, no murmur Resp: rales in bilateral lateral fields Abd: non-distended, soft Ext: cool feet, palpable pre-tibial pulses  Resolved Hospital Problem list   None at this time  Assessment & Plan:   Acute metabolic encephalopathy Currently intubated and sedated. Will wean as able.  - Correct electrolytes PRN - DC bicarb drip  Acute respiratory failure with hypoxemia Suspected aspiration PNA Continues to required 100% FiO2.  - continue vanc and unasyn - repeat ABG this AM - RVP - VAP  Septic shock Continues to need 3 pressors to maintain pressure. Axillary art line placed this AM. Blood cultures grew Strep agalactiae, awaiting sensitivities.  - continue vanc and unasyn - f/u blood cultures sensitivities - LR bolus 1L - hydrocortisone IV 100 mg BID for stress dose - MAP goal 65  Demand ischemia  Hx CAD [place] downtrending, no longer checking. EKG with depression in lateral leads. Cardiology suspects NSTEMI due to severe demand in setting of chronic obstructive  disease. Currently awaiting ECHO results. Cardiology ordered PR aspirin, however will hold this since he is on full dose heparin.  - f/u ECHO  Right axillary DVT Continue full dose heparin.   Lactic acidosis LA 4.3 this morning.  - LR bolus 1L - recheck q4 hours until stable or downtrending  Rhabdomyolysis  CK 1,733. No further rechecks. Will continue with aggressive  hydration.   Acute renal failure No UOP recorded. Cr this AM 2.82. Per daughter at bedside, family and patient would NOT want HD.  - daily CMP  Hyponatremia Na 130 this AM. Will continue to monitor.  - AM BMP  Best Practice (right click and "Reselect all SmartList Selections" daily)   Diet/type: NPO DVT prophylaxis: systemic heparin GI prophylaxis: PPI Lines: N/A Foley:  N/A Code Status:  full code Last date of multidisciplinary goals of care discussion [11/19 - DNR limited, no compressions, no HD]   Ezequiel Essex, MD Family Medicine PGY-2 [place] for pager  04/24/2021, 12:27 PM    Critical care attending attestation note:  Patient seen and examined and relevant ancillary tests reviewed.  I agree with the assessment and plan of care as outlined by Ezequiel Essex, MD. The following reflects my independent critical care time.   Synopsis of assessment and plan: 87 year old male who presented with altered mental status, complicated with aspiration pneumonia leading to septic shock and multiorgan failure  Patient is on multiple vasopressors, he is in severe ARDS with P/F ratio 50   Physical exam: General: Critically ill-appearing male, orally intubated HEENT: [place]/AT, eyes anicteric.  ETT and OGT in place Neuro: Not following commands.  Eyes are closed.  Pupils 3 mm bilateral reactive to light Chest: Bilateral basal crackles right more than left, no wheezes or rhonchi Heart: Regular rate and rhythm, no murmurs or gallops Abdomen: Soft, nontender, nondistended, bowel sounds present Skin: No rash  Labs and images were reviewed  Assessment and plan: Septic shock Aspiration bilateral multifocal pneumonia Streptococcal bacteremia Acute metabolic/septic encephalopathy Acute hypoxic respiratory failure Severe ARDS Anuric AKI Demand cardiac ischemia versus acute NSTEMI Right axillary DVT Acute  rhabdomyolysis Hyponatremia  Continue broad-spectrum IV antibiotics Continue vasopressors with map goal 65, currently on Levophed, epinephrine and vasopressin Patient blood culture came back positive for group B bacteremia Currently on vancomycin and Unasyn Minimize sedation with RASS goal 0/-1 Continue lung protective ventilation PEEP increased to 14, currently on FiO2 of 100% Unable to prone due to unstable on 3 pressor Patient has not made any urine in last 24 hours Serum creatinine continue to trend up Serum potassium is a stable Troponins are trending down Echocardiogram is pending Continue IV heparin infusion On ultrasound he was noted to have low right axillary acute DVT Continue IV fluid Monitor electrolytes  CRITICAL CARE Performed by: Jacky Kindle   Total independent critical care time: 51 minutes  Critical care time was exclusive of separately billable procedures and treating other patients.  Critical care was necessary to treat or prevent imminent or life-threatening deterioration.   Critical care was time spent personally by me on the following activities: development of treatment plan with patient and/or surrogate as well as nursing, discussions with consultants, evaluation of patient's response to treatment, examination of patient, obtaining history from patient or surrogate, ordering and performing treatments and interventions, ordering and review of laboratory studies, ordering and review of radiographic studies, pulse oximetry, re-evaluation of patient's condition and participation in multidisciplinary rounds.  [facility]  See Amion for pager If no response to pager, please call [phone] until 7pm After 7pm, Please call [place] [phone]  04/24/2021, 2:00 PM

## 2021-04-24 NOTE — Progress Notes (Signed)
ANTICOAGULATION CONSULT NOTE - Initial Consult  Pharmacy Consult for heparin Indication: chest pain/ACS  Allergies  Allergen Reactions   Advil [Ibuprofen] Shortness Of Breath and Swelling   Aleve [Naproxen Sodium] Other (See Comments)    angioedema   Tussionex Pennkinetic Er [Hydrocod Polst-Cpm Polst Er] Swelling and Other (See Comments)    Face swelling   Synthroid [Levothyroxine Sodium] Other (See Comments)    Not tolerated name brand    Patient Measurements: Height: 6\' 1"  (185.4 cm) Weight: 82.7 kg (182 lb 5.1 oz) IBW/kg (Calculated) : 79.9 Heparin Dosing Weight: 82.7  Vital Signs: Temp: 98 F (36.7 C) (11/19 0712) Temp Source: Oral (11/19 0712) BP: 107/74 (11/19 1000) Pulse Rate: 104 (11/19 1000)  Labs: Recent Labs    04/28/2021 0920 04/16/2021 1120 04/14/2021 1142 04/18/2021 1936 04/24/21 0108 04/24/21 0246 04/24/21 0900  HGB 14.2  --   --  13.3 14.0 14.6  --   HCT 43.8  --   --  39.0 43.5 43.0  --   PLT 236  --   --   --  238  --   --   APTT 30  --   --   --   --   --   --   LABPROT 15.6*  --   --   --   --   --   --   INR 1.2  --   --   --   --   --   --   HEPARINUNFRC  --   --   --   --   --   --  <0.10*  CREATININE 1.05  --   --   --  1.95*  --   --   CKTOTAL  --  1,733*  --   --   --   --   --   [id number] 2,229*  --  2,574*  --   --   --   --      Estimated Creatinine Clearance: 29.6 mL/min (A) (by C-G formula based on SCr of 1.95 mg/dL (H)).   Medical History: Past Medical History:  Diagnosis Date   Coronary artery disease    Diverticulosis    DJD (degenerative joint disease)    ED (erectile dysfunction)    Hemorrhoids    Hiatal hernia    Hypertension    Onychomycosis    Rosacea    Thyroid disease     Assessment: 64 YOM presenting after found on floor, elevated troponin, he is not on anticoagulation PTA, CBC wnl  HL <0.1 (on 1000 units/hr) Drip had been turned off yesterday afternoon then resumed at midnight Minimal bleeding around site.    Goal of Therapy:  Heparin level 0.3-0.7 units/ml Monitor platelets by anticoagulation protocol: Yes   Plan:  Increase to heparin 1150 units/hr HL @1800  Daily HL and CBC ordered Follow closely for signs/symptoms of bleeding  Thank you for allowing pharmacy to be a part of this patient's care.  Donnald Garre, PharmD Clinical Pharmacist  Please check AMION for all [place] numbers After 10:00 PM, call [place] [phone]

## 2021-04-24 NOTE — Progress Notes (Signed)
Progress Note  Patient Name: Gary Riggs Date of Encounter: 04/24/2021  [facility] Cardiologist: Buford Dresser, MD   Subjective   Intubated, sedated.   Inpatient Medications    Scheduled Meds:  chlorhexidine gluconate (MEDLINE KIT)  15 mL Mouth Rinse BID   Chlorhexidine Gluconate Cloth  6 each Topical Daily   docusate  100 mg Per Tube BID   insulin aspart  0-15 Units Subcutaneous Q4H   mouth rinse  15 mL Mouth Rinse 10 times per day   pantoprazole (PROTONIX) IV  40 mg Intravenous QHS   polyethylene glycol  17 g Per Tube Daily   sodium chloride flush  10-40 mL Intracatheter Q12H   Continuous Infusions:  sodium chloride     sodium chloride     ampicillin-sulbactam (UNASYN) IV     dexmedetomidine (PRECEDEX) IV infusion 0.4 mcg/kg/hr (04/24/21 1000)   epinephrine 6 mcg/min (04/24/21 1000)   fentaNYL infusion INTRAVENOUS Stopped (04/24/21 0925)   heparin 1,150 Units/hr (04/24/21 1015)   lactated ringers     lactated ringers     norepinephrine (LEVOPHED) Adult infusion 40 mcg/min (04/24/21 1000)   [START ON 05-03-21] vancomycin     vasopressin 0.03 Units/min (04/24/21 1000)   PRN Meds: docusate, fentaNYL, polyethylene glycol, sodium chloride flush   Vital Signs    Vitals:   04/24/21 0800 04/24/21 0818 04/24/21 0900 04/24/21 1000  BP: 107/70  104/70 107/74  Pulse: 99 (!) 103 (!) 102 (!) 104  Resp: (!) 24  (!) 24 (!) 24  Temp:      TempSrc:      SpO2: 94%  95% 96%  Weight:      Height:        Intake/Output Summary (Last 24 hours) at 04/24/2021 1033 Last data filed at 04/24/2021 1000 Gross per 24 hour  Intake 5044.7 ml  Output 0 ml  Net 5044.7 ml   Last 3 Weights 04/24/2021 04/22/2021 10/22/2020  Weight (lbs) 182 lb 5.1 oz 185 lb 189 lb  Weight (kg) 82.7 kg 83.915 kg 85.73 kg      Telemetry    SR, short runs of SVT - Personally Reviewed  ECG    N/a - Personally Reviewed  Physical Exam   GEN: elderly, intubated/sedated Neck: No  JVD Cardiac: RRR, no murmurs, rubs, or gallops.  Respiratory: Clear to auscultation bilaterally. GI: Soft, nontender, non-distended  MS: No edema; No deformity. Neuro:  Nonfocal  Psych:not able to assess, sedated  Labs    High Sensitivity Troponin:   Recent Labs  Lab 04/17/2021 0920 04/20/2021 1142  TROPONINIHS 4,631* 2,574*     Chemistry Recent Labs  Lab 04/10/2021 0920 04/28/2021 1936 04/24/21 0108 04/24/21 0246  NA 131* 130* 128* 124*  K 3.7 4.1 3.8 3.8  CL 97*  --  97*  --   CO2 23  --  18*  --   GLUCOSE 113*  --  252*  --   BUN 25*  --  35*  --   CREATININE 1.05  --  1.95*  --   CALCIUM 9.2  --  7.7*  --   MG  --   --  1.9  --   PROT 7.5  --   --   --   ALBUMIN 3.9  --   --   --   AST 128*  --   --   --   ALT 38  --   --   --   ALKPHOS 50  --   --   --  BILITOT 1.1  --   --   --   GFRNONAA >60  --  32*  --   ANIONGAP 11  --  13  --     Lipids  Recent Labs  Lab 04/24/21 0108  TRIG 55    Hematology Recent Labs  Lab 05/05/2021 0920 04/18/2021 1936 04/24/21 0108 04/24/21 0246  WBC 28.1*  --  25.8*  --   RBC 4.46  --  4.40  --   HGB 14.2 13.3 14.0 14.6  HCT 43.8 39.0 43.5 43.0  MCV 98.2  --  98.9  --   MCH 31.8  --  31.8  --   MCHC 32.4  --  32.2  --   RDW 13.1  --  13.2  --   PLT 236  --  238  --    Thyroid No results for input(s): TSH, FREET4 in the last 168 hours.  BNPNo results for input(s): BNP, PROBNP in the last 168 hours.  DDimer No results for input(s): DDIMER in the last 168 hours.   Radiology    DG Abd 1 View  Result Date: 05/03/2021 CLINICAL DATA:  Sepsis.  Nasogastric tube placement. EXAM: ABDOMEN - 1 VIEW COMPARISON:  None. FINDINGS: Tip and side port of the nasogastric tube are in the stomach. Nonobstructive bowel gas pattern. IMPRESSION: Nasogastric tube tip in the stomach. Electronically Signed   By: Ulyses Jarred M.D.   On: 05/05/2021 19:33   CT Head Wo Contrast  Result Date: 04/07/2021 CLINICAL DATA:  87 year old male with history  of mental status change. Found down on the floor. EXAM: CT HEAD WITHOUT CONTRAST TECHNIQUE: Contiguous axial images were obtained from the base of the skull through the vertex without intravenous contrast. COMPARISON:  No priors. FINDINGS: Brain: Mild cerebral atrophy. Patchy and confluent areas of decreased attenuation are noted throughout the deep and periventricular white matter of the cerebral hemispheres bilaterally, compatible with chronic microvascular ischemic disease. No evidence of acute infarction, hemorrhage, hydrocephalus, extra-axial collection or mass lesion/mass effect. Vascular: No hyperdense vessel or unexpected calcification. Skull: Normal. Negative for fracture or focal lesion. Sinuses/Orbits: No acute finding. Other: None. IMPRESSION: 1. No acute intracranial abnormalities. 2. Mild cerebral atrophy with extensive chronic microvascular ischemic changes in the cerebral white matter, as above. Electronically Signed   By: Vinnie Langton M.D.   On: 04/22/2021 09:55   CT HIP RIGHT W CONTRAST  Result Date: 04/13/2021 CLINICAL DATA:  Chronic hip pain. Patient found on floor although claims did not fall. EXAM: CT OF THE LOWER RIGHT EXTREMITY WITH CONTRAST TECHNIQUE: Multidetector CT imaging of the lower right extremity was performed according to the standard protocol following intravenous contrast administration. CONTRAST:  191m OMNIPAQUE IOHEXOL 300 MG/ML  SOLN COMPARISON:  Multiple exams, including radiographs 05/03/2021 and MRI 11/04/2020 FINDINGS: Bones/Joint/Cartilage Right SI joint unremarkable. Severe arthropathy of the right hip with full-thickness loss of craniocaudad articular cartilage and substantial acetabular and femoral head spurring. 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 Anterior to the femoral neck we demonstrate a well corticated 2.7 by 0.8 by 1.9 cm free fragment which  likely represents a chronically fragmented osteophyte. There is another similar anterior free fragment along the joint capsule anterior to the femoral head measuring 1.0 by 0.7 by 1.0 cm. Other smaller potential free fragments are present along the posterior acetabular rim. No acute fracture the right proximal femur or regional pelvis identified. There is  evidence of spondylosis at L5-S1. No significant right SI joint abnormality. Small right hip joint effusion fairly similar to the 11/04/2020 exam. No significant degree of enhancing synovitis is identified. Ligaments Suboptimally assessed by CT. Muscles and Tendons Unremarkable Soft tissues Sigmoid colon diverticulosis. There is a suggestion of a small amount of ascites in the right lower paracolic gutter region, not readily apparent on the MRI from 11/04/2020 Iliac artery atherosclerotic calcification is identified. Right external iliac node 0.9 cm in short axis on image 52 series 3, along with a similar borderline prominent right inguinal lymph node on image 56 series 3. Mild prostatomegaly. IMPRESSION: 1. Severe degenerative arthropathy of the right hip with a small hip effusion and at least 2 small free osteochondral fragments loose in the joint. Similar findings were shown on the MRI from 11/04/2020. 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 2. There is evidence of spondylosis at L5-S1. 3. Sigmoid colon diverticulosis. 4. Trace amount of ascites along the right lower paracolic gutter nonspecific. 5. Mild prostatomegaly. 6. Two upper normal sized right external iliac lymph nodes. Likely incidental but potentially mildly reactive. Electronically Signed   By: Van Clines M.D.   On: 04/19/2021 16:38   Portable Chest xray  Result Date: 04/24/2021 CLINICAL DATA:  Endotracheal tube placement.  Ventilator dependence. EXAM: PORTABLE CHEST 1 VIEW COMPARISON:  04/29/2021 FINDINGS: 0415  hours. Endotracheal tube tip is 6.9 cm above the base of the carina. The NG tube passes into the stomach although the distal tip position is not included on the film. Left IJ central line tip overlies the mid SVC level. Diffuse right and basilar left airspace disease is similar to prior. No evidence for pneumothorax. Small bilateral pleural effusions evident. The cardiopericardial silhouette is within normal limits for size. Telemetry leads overlie the chest. IMPRESSION: 1. No substantial change asymmetric bilateral airspace disease, right greater than left. 2. Small bilateral pleural effusions. Electronically Signed   By: Misty Stanley M.D.   On: 04/24/2021 09:51   DG CHEST PORT 1 VIEW  Result Date: 04/30/2021 CLINICAL DATA:  Central line placement EXAM: PORTABLE CHEST 1 VIEW COMPARISON:  05/03/2021 FINDINGS: Single frontal view of the chest demonstrates stable endotracheal tube and enteric catheter. Interval placement of a left internal jugular central venous catheter tip overlying superior vena cava. External defibrillator pads again seen overlying left chest. Cardiac silhouette is stable. Progressive bilateral airspace disease, right greater than left. Small right pleural effusion has developed. No pneumothorax. IMPRESSION: 1. No complication after left internal jugular catheter placement. 2. Progressive bilateral airspace disease and developing right pleural effusion. Electronically Signed   By: Randa Ngo M.D.   On: 04/21/2021 21:01   Portable Chest x-ray  Result Date: 04/28/2021 CLINICAL DATA:  NG placement. EXAM: PORTABLE CHEST 1 VIEW COMPARISON:  Earlier radiograph dated 04/29/2021. FINDINGS: Endotracheal tube with tip approximately 6.7 cm above the carina. Enteric tube extends below the diaphragm with tip in the body of the stomach. Interval worsening of asymmetric bilateral pulmonary opacities, right greater than left, since the prior radiograph. No pleural effusion pneumothorax. Stable  cardiac silhouette. No acute osseous pathology. IMPRESSION: 1. Endotracheal tube with tip above the carina. Enteric tube in the body of the stomach. 2. Interval worsening of asymmetric bilateral pulmonary opacities, right greater than left. Electronically Signed   By: Anner Crete M.D.   On: 04/17/2021 19:29   DG Chest Port 1 View  Result Date: 04/11/2021 CLINICAL DATA:  Tachypnea  and hypoxia. EXAM: PORTABLE CHEST 1 VIEW COMPARISON:  April 23, 2021 FINDINGS: The lungs are hyperinflated. Mild to moderate severity airspace disease is seen throughout the right lung. This is slightly more prominent within the right lung base. Mild left basilar atelectasis is noted. There is no evidence of a pleural effusion or pneumothorax. The cardiac silhouette is mildly enlarged. The visualized skeletal structures are unremarkable. IMPRESSION: 1. Mild to moderate severity right lung airspace disease, consistent with areas of atelectasis and/or infiltrate. 2. Mild left basilar atelectasis. Electronically Signed   By: Virgina Norfolk M.D.   On: 04/26/2021 18:47   DG Chest Port 1 View  Result Date: 04/13/2021 CLINICAL DATA:  Concern for sepsis EXAM: PORTABLE CHEST 1 VIEW COMPARISON:  Chest x-ray dated November 21, 2017 FINDINGS: Cardiac and mediastinal contours are within normal limits for AP technique. Mild bibasilar opacities. No large pleural effusion or pneumothorax. IMPRESSION: Mild bibasilar opacities, likely atelectasis. Electronically Signed   By: Yetta Glassman M.D.   On: 04/27/2021 09:03   DG HIP UNILAT WITH PELVIS 1V RIGHT  Result Date: 04/28/2021 CLINICAL DATA:  Right hip pain without known injury. EXAM: DG HIP (WITH OR WITHOUT PELVIS) 1V RIGHT COMPARISON:  July 10, 2020. FINDINGS: There is no evidence of hip fracture or dislocation. Severe narrowing and osteophyte formation is seen involving the right hip. IMPRESSION: Severe osteoarthritis of the right hip. No acute abnormality is noted.  Electronically Signed   By: Marijo Conception M.D.   On: 04/10/2021 10:04   VAS [place] LOWER EXTREMITY VENOUS (DVT)  Result Date: 04/09/2021  Lower Venous DVT Study Patient Name:  MAAHIR HORST  Date of Exam:   04/27/2021 Medical Rec #: [id number]         Accession #:    [id number] Date of Birth: November 19, 1930         Patient Gender: M Patient Age:   17 years Exam Location:  [facility] Procedure:      VAS [place] LOWER EXTREMITY VENOUS (DVT) Referring Phys: [facility] PFEIFFER --------------------------------------------------------------------------------  Indications: Stroke.  Comparison Study: no prior Performing Technologist: Archie Patten RVS  Examination Guidelines: A complete evaluation includes B-mode imaging, spectral Doppler, color Doppler, and power Doppler as needed of all accessible portions of each vessel. Bilateral testing is considered an integral part of a complete examination. Limited examinations for reoccurring indications may be performed as noted. The reflux portion of the exam is performed with the patient in reverse Trendelenburg.  +---------+---------------+---------+-----------+----------+--------------+ RIGHT    CompressibilityPhasicitySpontaneityPropertiesThrombus Aging +---------+---------------+---------+-----------+----------+--------------+ CFV      Full           Yes      Yes                                 +---------+---------------+---------+-----------+----------+--------------+ SFJ      Full                                                        +---------+---------------+---------+-----------+----------+--------------+ FV Prox  Full                                                        +---------+---------------+---------+-----------+----------+--------------+  FV Mid   Full                                                        +---------+---------------+---------+-----------+----------+--------------+ FV DistalFull                                                         +---------+---------------+---------+-----------+----------+--------------+ PFV      Full                                                        +---------+---------------+---------+-----------+----------+--------------+ POP      Full           Yes      Yes                                 +---------+---------------+---------+-----------+----------+--------------+ PTV      Full                                                        +---------+---------------+---------+-----------+----------+--------------+ PERO     Full                                                        +---------+---------------+---------+-----------+----------+--------------+   +----+---------------+---------+-----------+----------+--------------+ LEFTCompressibilityPhasicitySpontaneityPropertiesThrombus Aging +----+---------------+---------+-----------+----------+--------------+ CFV Full           Yes      Yes                                 +----+---------------+---------+-----------+----------+--------------+     Summary: RIGHT: - There is no evidence of deep vein thrombosis in the lower extremity.  - No cystic structure found in the popliteal fossa.  LEFT: - No evidence of common femoral vein obstruction.  *See table(s) above for measurements and observations.    Preliminary     Cardiac Studies     Patient Profile     SEATON HOFMANN is a 87 y.o. male with a hx of CAD s/p DES to mid RCA and proximal/mid LAD in October 2020 when with residual circumflex disease, hypertension, hyperlipidemia who is being seen 05/05/2021 for the evaluation of Elevated troponin at the request of Dr. Johnney Killian.   Admitted 03/2020 after syncope.  Stress test was abnormal.  Cardiac cath showed multivessel CAD.  Patient declined CABG.  Underwent successful PCI/DESx1 to mRCA, and DESx1 to p/mLAD using IVUS. Plan for DAPT with ASA/plavix for at least one year. Plan to treat Lcx  disease medically. Patient wanted to stay on Zocor.   Assessment & Plan  Elevated troponin/History of CAD - s/p DES to RCA and LAD 03/2020 - Hs-troponin 4631>> 2574 in setting of being down for unknown period of time with elevated lactic acid, WBC and CK total - -Patient does have residual disease in circumflex arteries -EKG this admission did showed ST depression in lead V4 to V6 - echo pending  - suspect NSTEMI due to severe demand in setting of chronic obstructive disease. Paitent presented with sepsis, respiratory failiure, lactic acidosis, rhabdo. Admitted after being found down on the floor unknown amount of time.   - medical therapy at this time with hep gtt. OG is to suction, we will start PR aspirin.  - no plans for cath at this time.   2. Respiratory failure - intubated, vent management per critical care   3. Pnemonia - with respiratory failure, intubated - s/p bronc with RML and RLLL aspiraiton  4.Shock - probable septic shock related to pneumonia, coox reported at 26% would suggest significant cardiogenic component, we are repeating. Echo is pending.  - peak lactic avid 6.6, down to 4.3 this AM. WBC is 26, blood cx's with strep agalactiae - epi drip at 6 mcg/min, levophed at 41mg/min, vasopression 0.03 units/min - abx per primary team.   5. AKI - in setting of hypotension, shock. Cr up to 1.9  6. Goals of care - from critical care note patient limited DNR with no chest compressions, family does not want aggressive treatment such as dialysis  For questions or updates, please contact [facility] Please consult [email or web address] for contact info under        Signed, BCarlyle Dolly MD  04/24/2021, 10:33 AM

## 2021-04-24 NOTE — Progress Notes (Signed)
[facility] [place] Progress Note Patient Name: Gary Riggs DOB: [birth year] MRN: [id number]   Date of Service  04/24/2021  HPI/Events of Note  2/2 blood cultures with strep agalactiae. Remains in septic/cardiogenic shock on 3 pressors  eICU Interventions  No change to antibiotics        Ranetta Armacost Rodman Pickle 04/24/2021, 2:04 AM

## 2021-04-25 DIAGNOSIS — I214 Non-ST elevation (NSTEMI) myocardial infarction: Secondary | ICD-10-CM | POA: Diagnosis not present

## 2021-04-25 DIAGNOSIS — K7200 Acute and subacute hepatic failure without coma: Secondary | ICD-10-CM

## 2021-04-25 DIAGNOSIS — A419 Sepsis, unspecified organism: Secondary | ICD-10-CM | POA: Diagnosis not present

## 2021-04-25 DIAGNOSIS — N179 Acute kidney failure, unspecified: Secondary | ICD-10-CM

## 2021-04-25 DIAGNOSIS — R6521 Severe sepsis with septic shock: Secondary | ICD-10-CM | POA: Diagnosis not present

## 2021-04-25 LAB — COMPREHENSIVE METABOLIC PANEL
ALT: 3193 U/L — ABNORMAL HIGH (ref 0–44)
AST: 5036 U/L — ABNORMAL HIGH (ref 15–41)
Albumin: 2 g/dL — ABNORMAL LOW (ref 3.5–5.0)
Alkaline Phosphatase: 68 U/L (ref 38–126)
Anion gap: 21 — ABNORMAL HIGH (ref 5–15)
BUN: 53 mg/dL — ABNORMAL HIGH (ref 8–23)
CO2: 11 mmol/L — ABNORMAL LOW (ref 22–32)
Calcium: 7.1 mg/dL — ABNORMAL LOW (ref 8.9–10.3)
Chloride: 99 mmol/L (ref 98–111)
Creatinine, Ser: 4.87 mg/dL — ABNORMAL HIGH (ref 0.61–1.24)
GFR, Estimated: 11 mL/min — ABNORMAL LOW (ref >60)
Glucose, Bld: 98 mg/dL (ref 70–99)
Potassium: 5.9 mmol/L — ABNORMAL HIGH (ref 3.5–5.1)
Sodium: 131 mmol/L — ABNORMAL LOW (ref 135–145)
Total Bilirubin: 1.3 mg/dL — ABNORMAL HIGH (ref 0.3–1.2)
Total Protein: 5.1 g/dL — ABNORMAL LOW (ref 6.5–8.1)

## 2021-04-25 LAB — POCT I-STAT 7, (LYTES, BLD GAS, ICA,H+H)
Acid-base deficit: 12 mmol/L — ABNORMAL HIGH (ref 0.0–2.0)
Acid-base deficit: 16 mmol/L — ABNORMAL HIGH (ref 0.0–2.0)
Acid-base deficit: 20 mmol/L — ABNORMAL HIGH (ref 0.0–2.0)
Bicarbonate: 13.6 mmol/L — ABNORMAL LOW (ref 20.0–28.0)
Bicarbonate: 12.1 mmol/L — ABNORMAL LOW (ref 20.0–28.0)
Bicarbonate: 10 mmol/L — ABNORMAL LOW (ref 20.0–28.0)
Calcium, Ion: 0.95 mmol/L — ABNORMAL LOW (ref 1.15–1.40)
Calcium, Ion: 0.9 mmol/L — ABNORMAL LOW (ref 1.15–1.40)
Calcium, Ion: 0.91 mmol/L — ABNORMAL LOW (ref 1.15–1.40)
HCT: 39 % (ref 39.0–52.0)
HCT: 37 % — ABNORMAL LOW (ref 39.0–52.0)
HCT: 35 % — ABNORMAL LOW (ref 39.0–52.0)
Hemoglobin: 13.3 g/dL (ref 13.0–17.0)
Hemoglobin: 12.6 g/dL — ABNORMAL LOW (ref 13.0–17.0)
Hemoglobin: 11.9 g/dL — ABNORMAL LOW (ref 13.0–17.0)
O2 Saturation: 91 %
O2 Saturation: 91 %
O2 Saturation: 92 %
Patient temperature: 97.6
Patient temperature: 99
Patient temperature: 99
Potassium: 5.3 mmol/L — ABNORMAL HIGH (ref 3.5–5.1)
Potassium: 5.7 mmol/L — ABNORMAL HIGH (ref 3.5–5.1)
Potassium: 5.8 mmol/L — ABNORMAL HIGH (ref 3.5–5.1)
Sodium: 131 mmol/L — ABNORMAL LOW (ref 135–145)
Sodium: 131 mmol/L — ABNORMAL LOW (ref 135–145)
Sodium: 131 mmol/L — ABNORMAL LOW (ref 135–145)
TCO2: 15 mmol/L — ABNORMAL LOW (ref 22–32)
TCO2: 13 mmol/L — ABNORMAL LOW (ref 22–32)
TCO2: 11 mmol/L — ABNORMAL LOW (ref 22–32)
pCO2 arterial: 30.4 mmHg — ABNORMAL LOW (ref 32.0–48.0)
pCO2 arterial: 34.3 mmHg (ref 32.0–48.0)
pCO2 arterial: 36.3 mmHg (ref 32.0–48.0)
pH, Arterial: 7.256 — ABNORMAL LOW (ref 7.350–7.450)
pH, Arterial: 7.157 — CL (ref 7.350–7.450)
pH, Arterial: 7.049 — CL (ref 7.350–7.450)
pO2, Arterial: 67 mmHg — ABNORMAL LOW (ref 83.0–108.0)
pO2, Arterial: 79 mmHg — ABNORMAL LOW (ref 83.0–108.0)
pO2, Arterial: 90 mmHg (ref 83.0–108.0)

## 2021-04-25 LAB — GLUCOSE, CAPILLARY
Glucose-Capillary: 104 mg/dL — ABNORMAL HIGH (ref 70–99)
Glucose-Capillary: 90 mg/dL (ref 70–99)
Glucose-Capillary: 205 mg/dL — ABNORMAL HIGH (ref 70–99)
Glucose-Capillary: 44 mg/dL — CL (ref 70–99)

## 2021-04-25 LAB — CULTURE, BLOOD (ROUTINE X 2)

## 2021-04-25 LAB — CULTURE, RESPIRATORY W GRAM STAIN
Culture: NO GROWTH
Gram Stain: NONE SEEN

## 2021-04-25 LAB — CBC
HCT: 40.9 % (ref 39.0–52.0)
Hemoglobin: 12.5 g/dL — ABNORMAL LOW (ref 13.0–17.0)
MCH: 32 pg (ref 26.0–34.0)
MCHC: 30.6 g/dL (ref 30.0–36.0)
MCV: 104.6 fL — ABNORMAL HIGH (ref 80.0–100.0)
Platelets: 174 10*3/uL (ref 150–400)
RBC: 3.91 MIL/uL — ABNORMAL LOW (ref 4.22–5.81)
RDW: 13.8 % (ref 11.5–15.5)
WBC: 22.3 10*3/uL — ABNORMAL HIGH (ref 4.0–10.5)
nRBC: 0.3 % — ABNORMAL HIGH (ref 0.0–0.2)

## 2021-04-25 LAB — HEPARIN LEVEL (UNFRACTIONATED): Heparin Unfractionated: 0.1 IU/mL — ABNORMAL LOW (ref 0.30–0.70)

## 2021-04-25 LAB — PROCALCITONIN: Procalcitonin: 19.29 ng/mL

## 2021-04-25 LAB — URINE CULTURE: Culture: NO GROWTH

## 2021-04-25 MED ORDER — SODIUM BICARBONATE 8.4 % IV SOLN: 100.0000 meq | Freq: Once | INTRAVENOUS | Status: DC

## 2021-04-25 MED ORDER — SODIUM BICARBONATE 8.4 % IV SOLN
100.0000 meq | Freq: Once | INTRAVENOUS | Status: AC
  Administered 2021-04-25: 100 meq via INTRAVENOUS
  Filled 2021-04-25: qty 50

## 2021-04-25 MED ORDER — VANCOMYCIN VARIABLE DOSE PER UNSTABLE RENAL FUNCTION (PHARMACIST DOSING): Status: DC

## 2021-04-25 MED ORDER — MIDAZOLAM HCL 2 MG/2ML IJ SOLN
2.0000 mg | INTRAMUSCULAR | Status: DC | PRN
  Administered 2021-04-25: 2 mg via INTRAVENOUS
  Filled 2021-04-25: qty 2

## 2021-04-25 MED ORDER — CALCIUM GLUCONATE-NACL 1-0.675 GM/50ML-% IV SOLN
1.0000 g | Freq: Once | INTRAVENOUS | Status: DC
  Administered 2021-04-25: 1000 mg via INTRAVENOUS
  Filled 2021-04-25: qty 50

## 2021-04-25 MED ORDER — DEXTROSE 50 % IV SOLN
INTRAVENOUS | Status: AC
  Filled 2021-04-25: qty 50

## 2021-04-25 MED ORDER — DEXTROSE 50 % IV SOLN
1.0000 | Freq: Once | INTRAVENOUS | Status: AC
  Administered 2021-04-25: 50 mL via INTRAVENOUS

## 2021-04-25 MED ORDER — GLYCOPYRROLATE 0.2 MG/ML IJ SOLN
0.1000 mg | Freq: Once | INTRAMUSCULAR | Status: AC
  Administered 2021-04-25: 0.1 mg via INTRAVENOUS
  Filled 2021-04-25: qty 1

## 2021-05-06 NOTE — Progress Notes (Signed)
[place] [place] Progress Note Patient Name: Gary Riggs DOB: June 10, 1930 MRN: [id number]   Date of Service  May 13, 2021  HPI/Events of Note  ABG on 100%/PRVC 24/TV 650/P 14 = 7.256/30.4/67/13.6  eICU Interventions  Plan: NaHCO3 100 meq IV X 1 now. Repeat ABG at 10 AM.     Intervention Category Major Interventions: Respiratory failure - evaluation and management;Acid-Base disturbance - evaluation and management  Juanita Devincent Eugene 05/13/21, 5:24 AM

## 2021-05-06 NOTE — Plan of Care (Signed)
GOALS OF CARE DISCUSSION   The Clinical status was relayed to daughter at bedside  in detail.   Updated and notified of patients medical condition.     Patient remains unresponsive and will not open eyes to command.   Patient is having a weak cough and struggling to remove secretions.   Patient with increased WOB and using accessory muscles to breathe Explained to family course of therapy and the modalities   Evidence of kidney failure, respiratory failure and liver failure with septic shock   Patient with Progressive multiorgan failure with a very high probablity of a very minimal chance of meaningful recovery despite all aggressive and optimal medical therapy.  Patient's daughter decided to proceed with comfort care. Comfort care orders were written     Family are satisfied with Plan of action and management. All questions answered    Jacky Kindle MD [facility] See Amion for pager If no response to pager, please call [phone] until 7pm After 7pm, Please call E-link [phone]

## 2021-05-06 NOTE — Death Summary Note (Signed)
DEATH SUMMARY   Patient Details  Name: Gary Riggs MRN: [id number] DOB: 01-15-1931  Admission/Discharge Information   Admit Date:  2021/05/08  Date of Death: Date of Death: 05-10-21  Time of Death: Time of Death: September 03, 1441  Length of Stay: 2  Referring Physician: Josetta Huddle, MD   Reason(s) for Hospitalization  Septic shock Acute hypoxic respiratory failure Severe ARDS Bilateral multifocal aspiration pneumonia Streptococcal agalactiae bacteremia Acute metabolic acidosis Demand cardiac ischemia, acute non-ST elevation MI was ruled out with negative echocardiogram Acute right axillary DVT Acute metabolic/septic encephalopathy Anuric AKI Acute rhabdomyolysis Hyponatremia Hyperkalemia Shock liver  Diagnoses  Preliminary cause of death:   Worsening septic shock due to severe ARDS, leading to withdrawal of care Secondary Diagnoses (including complications and co-morbidities):  Principal Problem:   Sepsis ([place]) Active Problems:   Essential hypertension   Coronary artery disease   Hypercholesterolemia   Rhabdomyolysis   Lactic acidosis   NSTEMI (non-ST elevated myocardial infarction) ([place])   Cellulitis   Acute metabolic encephalopathy   Acute respiratory failure with hypoxia [facility])   Brief Hospital Course (including significant findings, care, treatment, and services provided and events leading to death)  Gary Riggs is a 87 y.o. year old male who who presented to the [facility] ED with a chief complaint of AMS.   He developed AMS and new confusion on 11/17 per daughter. Neighbors checked on him on the AM of 2023-05-09 and he was found lying in the floor. EMS was called. ED course was notable for a rising lactate 2.6>6.6, CK 1700, NA 131, COVID and FLU -, room air., WBC 28k. IV antibiotics were started per sepsis protocol. Cardiology was consulted for an EKG with lateral ST depressions. Troponin [id number]. A heparin GTT was started. Hospitalists intented to admit the patient.  However he developed respiratory distress while in the ED. CXR demonstrated a large left infiltrated. He was intubated and bronched in the the ED. PCCM was consulted for assistance with acute respiratory failure.    Overnight, patient required 3 pressors to maintain BP. Vanc and unasyn continued. Blood culture positive for Strep agalactiae.   Patient was continued on broad-spectrum antibiotic therapy, he was on maximal support of mechanical ventilator, despite that his respiratory acidosis kept on getting worse, he remained hypoxic due to severe ARDS.  He was not proned because of vital sign instability, he was requiring 3 vasopressors to maintain map goal 65.  Over the course of 2 days patient went into multiorgan failure including acute renal failure with anuria, shock liver and worsening hypoxic and hypercapnic respiratory failure. Patient's family was informed about worsening clinical condition, they decided to proceed with comfort measures and withdrawal of care.  Vasopressors were stopped, patient was declared dead on 05/10/2021 at 2:43 PM.  Patient's family was at bedside    Pertinent Labs and Studies  Significant Diagnostic Studies DG Abd 1 View  Result Date: 05/08/2021 CLINICAL DATA:  Sepsis.  Nasogastric tube placement. EXAM: ABDOMEN - 1 VIEW COMPARISON:  None. FINDINGS: Tip and side port of the nasogastric tube are in the stomach. Nonobstructive bowel gas pattern. IMPRESSION: Nasogastric tube tip in the stomach. Electronically Signed   By: Ulyses Jarred M.D.   On: May 08, 2021 19:33   CT Head Wo Contrast  Result Date: 05/08/2021 CLINICAL DATA:  87 year old male with history of mental status change. Found down on the floor. EXAM: CT HEAD WITHOUT CONTRAST TECHNIQUE: Contiguous axial images were obtained from the base of the skull through the vertex without  intravenous contrast. COMPARISON:  No priors. FINDINGS: Brain: Mild cerebral atrophy. Patchy and confluent areas of decreased  attenuation are noted throughout the deep and periventricular white matter of the cerebral hemispheres bilaterally, compatible with chronic microvascular ischemic disease. No evidence of acute infarction, hemorrhage, hydrocephalus, extra-axial collection or mass lesion/mass effect. Vascular: No hyperdense vessel or unexpected calcification. Skull: Normal. Negative for fracture or focal lesion. Sinuses/Orbits: No acute finding. Other: None. IMPRESSION: 1. No acute intracranial abnormalities. 2. Mild cerebral atrophy with extensive chronic microvascular ischemic changes in the cerebral white matter, as above. Electronically Signed   By: Vinnie Langton M.D.   On: 04/26/2021 09:55   CT HIP RIGHT W CONTRAST  Result Date: 04/22/2021 CLINICAL DATA:  Chronic hip pain. Patient found on floor although claims did not fall. EXAM: CT OF THE LOWER RIGHT EXTREMITY WITH CONTRAST TECHNIQUE: Multidetector CT imaging of the lower right extremity was performed according to the standard protocol following intravenous contrast administration. CONTRAST:  130mL OMNIPAQUE IOHEXOL 300 MG/ML  SOLN COMPARISON:  Multiple exams, including radiographs 04/23/2021 and MRI 11/04/2020 FINDINGS: Bones/Joint/Cartilage Right SI joint unremarkable. Severe arthropathy of the right hip with full-thickness loss of craniocaudad articular cartilage and substantial acetabular and femoral head spurring. 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 Anterior to the femoral neck we demonstrate a well corticated 2.7 by 0.8 by 1.9 cm free fragment which likely represents a chronically fragmented osteophyte. There is another similar anterior free fragment along the joint capsule anterior to the femoral head measuring 1.0 by 0.7 by 1.0 cm. Other smaller potential free fragments are present along the posterior acetabular rim. No acute fracture the right  proximal femur or regional pelvis identified. There is evidence of spondylosis at L5-S1. No significant right SI joint abnormality. Small right hip joint effusion fairly similar to the 11/04/2020 exam. No significant degree of enhancing synovitis is identified. Ligaments Suboptimally assessed by CT. Muscles and Tendons Unremarkable Soft tissues Sigmoid colon diverticulosis. There is a suggestion of a small amount of ascites in the right lower paracolic gutter region, not readily apparent on the MRI from 11/04/2020 Iliac artery atherosclerotic calcification is identified. Right external iliac node 0.9 cm in short axis on image 52 series 3, along with a similar borderline prominent right inguinal lymph node on image 56 series 3. Mild prostatomegaly. IMPRESSION: 1. Severe degenerative arthropathy of the right hip with a small hip effusion and at least 2 small free osteochondral fragments loose in the joint. Similar findings were shown on the MRI from 11/04/2020. 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 2. There is evidence of spondylosis at L5-S1. 3. Sigmoid colon diverticulosis. 4. Trace amount of ascites along the right lower paracolic gutter nonspecific. 5. Mild prostatomegaly. 6. Two upper normal sized right external iliac lymph nodes. Likely incidental but potentially mildly reactive. Electronically Signed   By: Van Clines M.D.   On: 05/02/2021 16:38   Portable Chest xray  Result Date: 04/24/2021 CLINICAL DATA:  Endotracheal tube placement.  Ventilator dependence. EXAM: PORTABLE CHEST 1 VIEW COMPARISON:  04/07/2021 FINDINGS: 0415 hours. Endotracheal tube tip is 6.9 cm above the base of the carina. The NG tube passes into the stomach although the distal tip position is not included on the film. Left IJ central line tip overlies the mid SVC level. Diffuse right and basilar left airspace disease is similar to prior. No evidence for  pneumothorax. Small bilateral pleural effusions evident. The cardiopericardial silhouette is within normal limits for size. Telemetry leads overlie the chest. IMPRESSION: 1. No substantial change asymmetric bilateral airspace disease, right greater than left. 2. Small bilateral pleural effusions. Electronically Signed   By: Misty Stanley M.D.   On: 04/24/2021 09:51   DG CHEST PORT 1 VIEW  Result Date: 04/28/2021 CLINICAL DATA:  Central line placement EXAM: PORTABLE CHEST 1 VIEW COMPARISON:  04/07/2021 FINDINGS: Single frontal view of the chest demonstrates stable endotracheal tube and enteric catheter. Interval placement of a left internal jugular central venous catheter tip overlying superior vena cava. External defibrillator pads again seen overlying left chest. Cardiac silhouette is stable. Progressive bilateral airspace disease, right greater than left. Small right pleural effusion has developed. No pneumothorax. IMPRESSION: 1. No complication after left internal jugular catheter placement. 2. Progressive bilateral airspace disease and developing right pleural effusion. Electronically Signed   By: Randa Ngo M.D.   On: 05/03/2021 21:01   Portable Chest x-ray  Result Date: 04/10/2021 CLINICAL DATA:  NG placement. EXAM: PORTABLE CHEST 1 VIEW COMPARISON:  Earlier radiograph dated 04/10/2021. FINDINGS: Endotracheal tube with tip approximately 6.7 cm above the carina. Enteric tube extends below the diaphragm with tip in the body of the stomach. Interval worsening of asymmetric bilateral pulmonary opacities, right greater than left, since the prior radiograph. No pleural effusion pneumothorax. Stable cardiac silhouette. No acute osseous pathology. IMPRESSION: 1. Endotracheal tube with tip above the carina. Enteric tube in the body of the stomach. 2. Interval worsening of asymmetric bilateral pulmonary opacities, right greater than left. Electronically Signed   By: Anner Crete M.D.   On: 04/30/2021  19:29   DG Chest Port 1 View  Result Date: 04/26/2021 CLINICAL DATA:  Tachypnea and hypoxia. EXAM: PORTABLE CHEST 1 VIEW COMPARISON:  April 23, 2021 FINDINGS: The lungs are hyperinflated. Mild to moderate severity airspace disease is seen throughout the right lung. This is slightly more prominent within the right lung base. Mild left basilar atelectasis is noted. There is no evidence of a pleural effusion or pneumothorax. The cardiac silhouette is mildly enlarged. The visualized skeletal structures are unremarkable. IMPRESSION: 1. Mild to moderate severity right lung airspace disease, consistent with areas of atelectasis and/or infiltrate. 2. Mild left basilar atelectasis. Electronically Signed   By: Virgina Norfolk M.D.   On: 04/20/2021 18:47   DG Chest Port 1 View  Result Date: 04/15/2021 CLINICAL DATA:  Concern for sepsis EXAM: PORTABLE CHEST 1 VIEW COMPARISON:  Chest x-ray dated November 21, 2017 FINDINGS: Cardiac and mediastinal contours are within normal limits for AP technique. Mild bibasilar opacities. No large pleural effusion or pneumothorax. IMPRESSION: Mild bibasilar opacities, likely atelectasis. Electronically Signed   By: Yetta Glassman M.D.   On: 04/13/2021 09:03   ECHOCARDIOGRAM COMPLETE  Result Date: 04/24/2021    ECHOCARDIOGRAM REPORT   Patient Name:   Gary Riggs Date of Exam: 04/24/2021 Medical Rec #:  [id number]        Height:       73.0 in Accession #:    [id number]       Weight:       182.3 lb Date of Birth:  June 17, 1930        BSA:          2.069 m Patient Age:    68 years         BP:           107/74 mmHg Patient Gender: M  HR:           100 bpm. Exam Location:  Inpatient Procedure: 2D Echo, Color Doppler and Cardiac Doppler Indications:    R94.31 Abnormal EKG  History:        Patient has prior history of Echocardiogram examinations, most                 recent 03/30/2020. CAD; Risk Factors:Hypertension.  Sonographer:    Raquel Sarna Senior RDCS Referring Phys:  [id number] Estill Cotta  Sonographer Comments: Extremely difficult study, scanned by 2 different techs, very difficult to separate mitral and aortic valve signals. IMPRESSIONS  1. Technically difficult; aortic valve not well visualized but appears calcified; mean gradient of 31 mmHg suggests moderate AS; possible oscillating density on mitral valve; MR not well interrogated but appears to be mild; suggest TEE if clinically indicated.  2. Left ventricular ejection fraction, by estimation, is 60 to 65%. The left ventricle has normal function. The left ventricle has no regional wall motion abnormalities. There is mild left ventricular hypertrophy. Left ventricular diastolic parameters were normal.  3. Right ventricular systolic function is normal. The right ventricular size is normal. There is moderately elevated pulmonary artery systolic pressure.  4. Left atrial size was mildly dilated.  5. The mitral valve is normal in structure. Mild mitral valve regurgitation. No evidence of mitral stenosis.  6. The aortic valve has an indeterminant number of cusps. Aortic valve regurgitation is not visualized. Moderate aortic valve stenosis.  7. The inferior vena cava is normal in size with <50% respiratory variability, suggesting right atrial pressure of 8 mmHg. FINDINGS  Left Ventricle: Left ventricular ejection fraction, by estimation, is 60 to 65%. The left ventricle has normal function. The left ventricle has no regional wall motion abnormalities. The left ventricular internal cavity size was normal in size. There is  mild left ventricular hypertrophy. Left ventricular diastolic parameters were normal. Right Ventricle: The right ventricular size is normal. Right ventricular systolic function is normal. There is moderately elevated pulmonary artery systolic pressure. The tricuspid regurgitant velocity is 3.27 m/s, and with an assumed right atrial pressure of 8 mmHg, the estimated right ventricular systolic pressure is 20.9  mmHg. Left Atrium: Left atrial size was mildly dilated. Right Atrium: Right atrial size was normal in size. Pericardium: There is no evidence of pericardial effusion. Mitral Valve: The mitral valve is normal in structure. Mild mitral annular calcification. Mild mitral valve regurgitation. No evidence of mitral valve stenosis. Tricuspid Valve: The tricuspid valve is normal in structure. Tricuspid valve regurgitation is trivial. No evidence of tricuspid stenosis. Aortic Valve: The aortic valve has an indeterminant number of cusps. Aortic valve regurgitation is not visualized. Moderate aortic stenosis is present. Aortic valve mean gradient measures 31.0 mmHg. Aortic valve peak gradient measures 53.3 mmHg. Aortic valve area, by VTI measures 0.57 cm. Pulmonic Valve: The pulmonic valve was not well visualized. Pulmonic valve regurgitation is not visualized. No evidence of pulmonic stenosis. Aorta: The aortic root is normal in size and structure. Venous: The inferior vena cava is normal in size with less than 50% respiratory variability, suggesting right atrial pressure of 8 mmHg. IAS/Shunts: No atrial level shunt detected by color flow Doppler. Additional Comments: Technically difficult; aortic valve not well visualized but appears calcified; mean gradient of 31 mmHg suggests moderate AS; possible oscillating density on mitral valve; MR not well interrogated but appears to be mild; suggest TEE if clinically indicated.  LEFT VENTRICLE PLAX 2D LVIDd:  4.70 cm LVIDs:         3.90 cm LV PW:         0.80 cm LV IVS:        1.10 cm LVOT diam:     2.20 cm LV SV:         35 LV SV Index:   17 LVOT Area:     3.80 cm  RIGHT VENTRICLE RV S prime:     13.50 cm/s TAPSE (M-mode): 2.2 cm LEFT ATRIUM           Index        RIGHT ATRIUM           Index LA diam:      4.80 cm 2.32 cm/m   RA Area:     16.70 cm LA Vol (A2C): 81.4 ml 39.35 ml/m  RA Volume:   43.60 ml  21.08 ml/m LA Vol (A4C): 63.9 ml 30.89 ml/m  AORTIC VALVE AV  Area (Vmax):    0.60 cm AV Area (Vmean):   0.65 cm AV Area (VTI):     0.57 cm AV Vmax:           365.00 cm/s AV Vmean:          259.000 cm/s AV VTI:            0.609 m AV Peak Grad:      53.3 mmHg AV Mean Grad:      31.0 mmHg LVOT Vmax:         57.80 cm/s LVOT Vmean:        44.100 cm/s LVOT VTI:          0.091 m LVOT/AV VTI ratio: 0.15  AORTA Ao Root diam: 3.00 cm Ao Asc diam:  3.50 cm TRICUSPID VALVE TR Peak grad:   42.8 mmHg TR Vmax:        327.00 cm/s  SHUNTS Systemic VTI:  0.09 m Systemic Diam: 2.20 cm Kirk Ruths MD Electronically signed by Kirk Ruths MD Signature Date/Time: 04/24/2021/2:05:27 PM    Final    DG HIP UNILAT WITH PELVIS 1V RIGHT  Result Date: 04/30/2021 CLINICAL DATA:  Right hip pain without known injury. EXAM: DG HIP (WITH OR WITHOUT PELVIS) 1V RIGHT COMPARISON:  July 10, 2020. FINDINGS: There is no evidence of hip fracture or dislocation. Severe narrowing and osteophyte formation is seen involving the right hip. IMPRESSION: Severe osteoarthritis of the right hip. No acute abnormality is noted. Electronically Signed   By: Marijo Conception M.D.   On: 04/26/2021 10:04   VAS [place] LOWER EXTREMITY VENOUS (DVT)  Result Date: 04/24/2021  Lower Venous DVT Study Patient Name:  Gary Riggs  Date of Exam:   04/13/2021 Medical Rec #: [id number]         Accession #:    [id number] Date of Birth: Sep 08, 1930         Patient Gender: M Patient Age:   64 years Exam Location:  [facility] Procedure:      VAS [place] LOWER EXTREMITY VENOUS (DVT) Referring Phys: [facility] PFEIFFER --------------------------------------------------------------------------------  Indications: Stroke.  Comparison Study: no prior Performing Technologist: Archie Patten RVS  Examination Guidelines: A complete evaluation includes B-mode imaging, spectral Doppler, color Doppler, and power Doppler as needed of all accessible portions of each vessel. Bilateral testing is considered an integral part of a complete  examination. Limited examinations for reoccurring indications may be performed as noted. The reflux portion of the exam is performed with  the patient in reverse Trendelenburg.  +---------+---------------+---------+-----------+----------+--------------+ RIGHT    CompressibilityPhasicitySpontaneityPropertiesThrombus Aging +---------+---------------+---------+-----------+----------+--------------+ CFV      Full           Yes      Yes                                 +---------+---------------+---------+-----------+----------+--------------+ SFJ      Full                                                        +---------+---------------+---------+-----------+----------+--------------+ FV Prox  Full                                                        +---------+---------------+---------+-----------+----------+--------------+ FV Mid   Full                                                        +---------+---------------+---------+-----------+----------+--------------+ FV DistalFull                                                        +---------+---------------+---------+-----------+----------+--------------+ PFV      Full                                                        +---------+---------------+---------+-----------+----------+--------------+ POP      Full           Yes      Yes                                 +---------+---------------+---------+-----------+----------+--------------+ PTV      Full                                                        +---------+---------------+---------+-----------+----------+--------------+ PERO     Full                                                        +---------+---------------+---------+-----------+----------+--------------+   +----+---------------+---------+-----------+----------+--------------+ LEFTCompressibilityPhasicitySpontaneityPropertiesThrombus Aging  +----+---------------+---------+-----------+----------+--------------+ CFV Full           Yes      Yes                                 +----+---------------+---------+-----------+----------+--------------+  Summary: RIGHT: - There is no evidence of deep vein thrombosis in the lower extremity.  - No cystic structure found in the popliteal fossa.  LEFT: - No evidence of common femoral vein obstruction.  *See table(s) above for measurements and observations. Electronically signed by Jamelle Haring on 04/24/2021 at 11:07:56 AM.    Final     Microbiology Recent Results (from the past 240 hour(s))  Blood Culture (routine x 2)     Status: Abnormal   Collection Time: 04/09/2021  8:47 AM   Specimen: BLOOD  Result Value Ref Range Status   Specimen Description BLOOD SITE NOT SPECIFIED  Final   Special Requests   Final    BOTTLES DRAWN AEROBIC AND ANAEROBIC Blood Culture results may not be optimal due to an inadequate volume of blood received in culture bottles   Culture  Setup Time   Final    GRAM POSITIVE COCCI IN BOTH AEROBIC AND ANAEROBIC BOTTLES CRITICAL RESULT CALLED TO, READ BACK BY AND VERIFIED WITH: PHARMD JAMES LEDFORD 04/24/21@1 :02 BY TW Performed at [facility], [address] [place], [place] [address]    Culture GROUP B STREP(S.AGALACTIAE)ISOLATED (A)  Final   Report Status 05-23-2021 FINAL  Final   Organism ID, Bacteria GROUP B STREP(S.AGALACTIAE)ISOLATED  Final      Susceptibility   Group b strep(s.agalactiae)isolated - MIC*    CLINDAMYCIN RESISTANT Resistant     AMPICILLIN <=0.25 SENSITIVE Sensitive     ERYTHROMYCIN >=8 RESISTANT Resistant     VANCOMYCIN 0.5 SENSITIVE Sensitive     CEFTRIAXONE <=0.12 SENSITIVE Sensitive     LEVOFLOXACIN 0.5 SENSITIVE Sensitive     * GROUP B STREP(S.AGALACTIAE)ISOLATED  Blood Culture ID Panel (Reflexed)     Status: Abnormal   Collection Time: 04/12/2021  8:47 AM  Result Value Ref Range Status   Enterococcus faecalis NOT DETECTED NOT  DETECTED Final   Enterococcus Faecium NOT DETECTED NOT DETECTED Final   Listeria monocytogenes NOT DETECTED NOT DETECTED Final   Staphylococcus species NOT DETECTED NOT DETECTED Final   Staphylococcus aureus (BCID) NOT DETECTED NOT DETECTED Final   Staphylococcus epidermidis NOT DETECTED NOT DETECTED Final   Staphylococcus lugdunensis NOT DETECTED NOT DETECTED Final   Streptococcus species DETECTED (A) NOT DETECTED Final    Comment: CRITICAL RESULT CALLED TO, READ BACK BY AND VERIFIED WITH: PHARMD JAMES LEDFORD 04/24/21@1 :02 BY TW    Streptococcus agalactiae DETECTED (A) NOT DETECTED Final    Comment: CRITICAL RESULT CALLED TO, READ BACK BY AND VERIFIED WITH: PHARMD JAMES LEDFORD 04/24/21@1 :02 BY TW    Streptococcus pneumoniae NOT DETECTED NOT DETECTED Final   Streptococcus pyogenes NOT DETECTED NOT DETECTED Final   A.calcoaceticus-baumannii NOT DETECTED NOT DETECTED Final   Bacteroides fragilis NOT DETECTED NOT DETECTED Final   Enterobacterales NOT DETECTED NOT DETECTED Final   Enterobacter cloacae complex NOT DETECTED NOT DETECTED Final   Escherichia coli NOT DETECTED NOT DETECTED Final   Klebsiella aerogenes NOT DETECTED NOT DETECTED Final   Klebsiella oxytoca NOT DETECTED NOT DETECTED Final   Klebsiella pneumoniae NOT DETECTED NOT DETECTED Final   Proteus species NOT DETECTED NOT DETECTED Final   Salmonella species NOT DETECTED NOT DETECTED Final   Serratia marcescens NOT DETECTED NOT DETECTED Final   Haemophilus influenzae NOT DETECTED NOT DETECTED Final   Neisseria meningitidis NOT DETECTED NOT DETECTED Final   Pseudomonas aeruginosa NOT DETECTED NOT DETECTED Final   Stenotrophomonas maltophilia NOT DETECTED NOT DETECTED Final   Candida albicans NOT DETECTED NOT DETECTED Final  Candida auris NOT DETECTED NOT DETECTED Final   Candida glabrata NOT DETECTED NOT DETECTED Final   Candida krusei NOT DETECTED NOT DETECTED Final   Candida parapsilosis NOT DETECTED NOT DETECTED  Final   Candida tropicalis NOT DETECTED NOT DETECTED Final   Cryptococcus neoformans/gattii NOT DETECTED NOT DETECTED Final    Comment: Performed at [facility], [place] [address], [place], [place] [address]  Resp Panel by RT-PCR (Flu A&B, Covid) Nasopharyngeal Swab     Status: None   Collection Time: 04/17/2021  8:49 AM   Specimen: Nasopharyngeal Swab; Nasopharyngeal(NP) swabs in vial transport medium  Result Value Ref Range Status   SARS Coronavirus 2 by RT PCR NEGATIVE NEGATIVE Final    Comment: (NOTE) SARS-CoV-2 target nucleic acids are NOT DETECTED.  The SARS-CoV-2 RNA is generally detectable in upper respiratory specimens during the acute phase of infection. The lowest concentration of SARS-CoV-2 viral copies this assay can detect is 138 copies/mL. A negative result does not preclude SARS-Cov-2 infection and should not be used as the sole basis for treatment or other patient management decisions. A negative result may occur with  improper specimen collection/handling, submission of specimen other than nasopharyngeal swab, presence of viral mutation(s) within the areas targeted by this assay, and inadequate number of viral copies(<138 copies/mL). A negative result must be combined with clinical observations, patient history, and epidemiological information. The expected result is Negative.  Fact Sheet for Patients:  EntrepreneurPulse.com.au  Fact Sheet for Healthcare Providers:  IncredibleEmployment.be  This test is no t yet approved or cleared by the [place] FDA and  has been authorized for detection and/or diagnosis of SARS-CoV-2 by FDA under an Emergency Use Authorization (EUA). This EUA will remain  in effect (meaning this test can be used) for the duration of the COVID-19 declaration under Section 564(b)(1) of the Act, 21 U.S.C.section 360bbb-3(b)(1), unless the authorization is terminated  or revoked sooner.        Influenza A by PCR NEGATIVE NEGATIVE Final   Influenza B by PCR NEGATIVE NEGATIVE Final    Comment: (NOTE) The Xpert Xpress SARS-CoV-2/FLU/RSV plus assay is intended as an aid in the diagnosis of influenza from Nasopharyngeal swab specimens and should not be used as a sole basis for treatment. Nasal washings and aspirates are unacceptable for Xpert Xpress SARS-CoV-2/FLU/RSV testing.  Fact Sheet for Patients: EntrepreneurPulse.com.au  Fact Sheet for Healthcare Providers: IncredibleEmployment.be  This test is not yet approved or cleared by the [place] FDA and has been authorized for detection and/or diagnosis of SARS-CoV-2 by FDA under an Emergency Use Authorization (EUA). This EUA will remain in effect (meaning this test can be used) for the duration of the COVID-19 declaration under Section 564(b)(1) of the Act, 21 U.S.C. section 360bbb-3(b)(1), unless the authorization is terminated or revoked.  Performed at [facility], [place] [address], [place], [place] [address]   Blood Culture (routine x 2)     Status: Abnormal   Collection Time: 04/27/2021  8:52 AM   Specimen: BLOOD  Result Value Ref Range Status   Specimen Description BLOOD BLOOD RIGHT HAND  Final   Special Requests   Final    AEROBIC BOTTLE ONLY Blood Culture results may not be optimal due to an inadequate volume of blood received in culture bottles   Culture  Setup Time   Final    GRAM POSITIVE COCCI AEROBIC BOTTLE ONLY CRITICAL VALUE NOTED.  VALUE IS CONSISTENT WITH PREVIOUSLY REPORTED AND CALLED VALUE.  Culture (A)  Final    GROUP B STREP(S.AGALACTIAE)ISOLATED SUSCEPTIBILITIES PERFORMED ON PREVIOUS CULTURE WITHIN THE LAST 5 DAYS. Performed at [facility], [place] [address] [place], [place] [address]    Report Status 05-19-2021 FINAL  Final  Culture, Respiratory w Gram Stain (tracheal aspirate)     Status: None   Collection Time: 04/29/2021  6:34 PM   Specimen:  Tracheal Aspirate; Respiratory  Result Value Ref Range Status   Specimen Description TRACHEAL ASPIRATE  Final   Special Requests NONE  Final   Gram Stain   Final    NO SQUAMOUS EPITHELIAL CELLS SEEN MODERATE WBC SEEN NO ORGANISMS SEEN    Culture   Final    NO GROWTH 2 DAYS Performed at [facility], [address] [place], [place] [address]    Report Status May 19, 2021 FINAL  Final  MRSA Next Gen by PCR, Nasal     Status: None   Collection Time: 04/24/21 12:55 AM   Specimen: Nasal Mucosa; Nasal Swab  Result Value Ref Range Status   MRSA by PCR Next Gen NOT DETECTED NOT DETECTED Final    Comment: (NOTE) The GeneXpert MRSA Assay (FDA approved for NASAL specimens only), is one component of a comprehensive MRSA colonization surveillance program. It is not intended to diagnose MRSA infection nor to guide or monitor treatment for MRSA infections. Test performance is not FDA approved in patients less than 69 years old. Performed at [facility], [place] [address] [place], [place] [address]   Respiratory (~20 pathogens) panel by PCR     Status: None   Collection Time: 04/24/21  7:43 AM   Specimen: Nasopharyngeal Swab; Respiratory  Result Value Ref Range Status   Adenovirus NOT DETECTED NOT DETECTED Final   Coronavirus 229E NOT DETECTED NOT DETECTED Final    Comment: (NOTE) The Coronavirus on the Respiratory Panel, DOES NOT test for the novel  Coronavirus (2019 nCoV)    Coronavirus HKU1 NOT DETECTED NOT DETECTED Final   Coronavirus NL63 NOT DETECTED NOT DETECTED Final   Coronavirus OC43 NOT DETECTED NOT DETECTED Final   Metapneumovirus NOT DETECTED NOT DETECTED Final   Rhinovirus / Enterovirus NOT DETECTED NOT DETECTED Final   Influenza A NOT DETECTED NOT DETECTED Final   Influenza B NOT DETECTED NOT DETECTED Final   Parainfluenza Virus 1 NOT DETECTED NOT DETECTED Final   Parainfluenza Virus 2 NOT DETECTED NOT DETECTED Final   Parainfluenza Virus 3 NOT DETECTED NOT  DETECTED Final   Parainfluenza Virus 4 NOT DETECTED NOT DETECTED Final   Respiratory Syncytial Virus NOT DETECTED NOT DETECTED Final   Bordetella pertussis NOT DETECTED NOT DETECTED Final   Bordetella Parapertussis NOT DETECTED NOT DETECTED Final   Chlamydophila pneumoniae NOT DETECTED NOT DETECTED Final   Mycoplasma pneumoniae NOT DETECTED NOT DETECTED Final    Comment: Performed at [facility], [place]. [address] [place], [place] [address]  Urine Culture     Status: None   Collection Time: 04/24/21 12:47 PM   Specimen: In/Out Cath Urine  Result Value Ref Range Status   Specimen Description IN/OUT CATH URINE  Final   Special Requests NONE  Final   Culture   Final    NO GROWTH Performed at [facility], [place] [address], [place], [place] [address]    Report Status May 19, 2021 FINAL  Final    Lab Basic Metabolic Panel: Recent Labs  Lab 04/24/2021 0920 04/08/2021 1936 04/24/21 0108 04/24/21 0246 04/24/21 1045  04/24/21 1110 04/24/21 1610 14-May-2021 0416 May 14, 2021 0827 14-May-2021 0853 May 14, 2021 1149  NA 131*   < > 128*   < > 131*   < > 129* 131* 131* 131* 131*  K 3.7   < > 3.8   < > 4.1   < > 4.8 5.3* 5.9* 5.7* 5.8*  CL 97*  --  97*  --  98  --   --   --  99  --   --   CO2 23  --  18*  --  22  --   --   --  11*  --   --   GLUCOSE 113*  --  252*  --  193*  --   --   --  98  --   --   BUN 25*  --  35*  --  40*  --   --   --  53*  --   --   CREATININE 1.05  --  1.95*  --  2.82*  --   --   --  4.87*  --   --   CALCIUM 9.2  --  7.7*  --  7.4*  --   --   --  7.1*  --   --   MG  --   --  1.9  --   --   --   --   --   --   --   --   PHOS  --   --  3.9  --   --   --   --   --   --   --   --    < > = values in this interval not displayed.   Liver Function Tests: Recent Labs  Lab 04/24/2021 0920 2021-05-14 0827  AST 128* 5,036*  ALT 38 3,193*  ALKPHOS 50 68  BILITOT 1.1 1.3*  PROT 7.5 5.1*  ALBUMIN 3.9 2.0*   No results for input(s): LIPASE, AMYLASE in the last 168 hours. No  results for input(s): AMMONIA in the last 168 hours. CBC: Recent Labs  Lab 05/05/2021 0920 04/17/2021 1936 04/24/21 0108 04/24/21 0246 04/24/21 1610 2021-05-14 0416 2021/05/14 0853 05/14/21 1140 05/14/21 1149  WBC 28.1*  --  25.8*  --   --   --   --  22.3*  --   NEUTROABS 25.6*  --  22.6*  --   --   --   --   --   --   HGB 14.2   < > 14.0   < > 12.9* 13.3 12.6* 12.5* 11.9*  HCT 43.8   < > 43.5   < > 38.0* 39.0 37.0* 40.9 35.0*  MCV 98.2  --  98.9  --   --   --   --  104.6*  --   PLT 236  --  238  --   --   --   --  174  --    < > = values in this interval not displayed.   Cardiac Enzymes: Recent Labs  Lab 04/17/2021 1120  CKTOTAL 1,733*   Sepsis Labs: Recent Labs  Lab 04/08/2021 0920 04/28/2021 1142 05/04/2021 2104 04/24/21 0108 04/24/21 0111 04/24/21 1045 04/24/21 1238 05/14/2021 0827 05-14-2021 1140  PROCALCITON  --   --   --   --   --   --   --  19.29  --   WBC 28.1*  --   --  25.8*  --   --   --   --  22.3*  LATICACIDVEN 2.6*   < > 3.8*  --  4.3* 3.3* 3.6*  --   --    < > = values in this interval not displayed.    Procedures/Operations     [employer] 04/30/2021, 8:45 AM

## 2021-05-06 NOTE — Progress Notes (Signed)
Patient placed on comfort care by MD per family's request. Family at bedside. Fentanyl and Versed administered and patient remains intubated per MD's orders. Pressors discontinued per comfort care protocol. Patient pronounced decease at 79 by two RNs. MD notified.[employer] notified.

## 2021-05-06 NOTE — Progress Notes (Signed)
Progress Note  Patient Name: Gary Riggs Date of Encounter: 05-03-2021  [facility] Cardiologist: Buford Dresser, MD   Subjective   No acute events overnight  Inpatient Medications    Scheduled Meds:  aspirin  300 mg Rectal Daily   chlorhexidine gluconate (MEDLINE KIT)  15 mL Mouth Rinse BID   Chlorhexidine Gluconate Cloth  6 each Topical Daily   dextrose  1 ampule Intravenous Once   docusate  100 mg Per Tube BID   hydrocortisone sod succinate (SOLU-CORTEF) inj  100 mg Intravenous Q12H   insulin aspart  0-15 Units Subcutaneous Q4H   mouth rinse  15 mL Mouth Rinse 10 times per day   pantoprazole (PROTONIX) IV  40 mg Intravenous QHS   polyethylene glycol  17 g Per Tube Daily   sodium bicarbonate  100 mEq Intravenous Once   sodium chloride flush  10-40 mL Intracatheter Q12H   vancomycin variable dose per unstable renal function (pharmacist dosing)   Does not apply See admin instructions   Continuous Infusions:  sodium chloride     sodium chloride     ampicillin-sulbactam (UNASYN) IV 3 g (2021/05/03 0917)   calcium gluconate 1,000 mg (05/03/21 1157)   dexmedetomidine (PRECEDEX) IV infusion 0.5 mcg/kg/hr (05/03/2021 0700)   epinephrine 8 mcg/min (05-03-21 0934)   fentaNYL infusion INTRAVENOUS 100 mcg/hr (05-03-21 0700)   heparin 1,250 Units/hr (03-May-2021 1001)   lactated ringers Stopped (04/24/21 1131)   norepinephrine (LEVOPHED) Adult infusion 40 mcg/min (May 03, 2021 0700)   vasopressin 0.03 Units/min (May 03, 2021 0700)   PRN Meds: acetaminophen (TYLENOL) oral liquid 160 mg/5 mL, docusate, fentaNYL, polyethylene glycol, sodium chloride flush   Vital Signs    Vitals:   05-03-21 0900 2021-05-03 1000 03-May-2021 1100 05/03/21 1152  BP:    (!) 90/56  Pulse: (!) 107 62 (!) 55 (!) 108  Resp: 19 (!) 22 (!) 26 (!) 30  Temp: 99.3 F (37.4 C) 99.3 F (37.4 C) (!) 96.3 F (35.7 C) 97.7 F (36.5 C)  TempSrc:      SpO2: 94%   90%  Weight:      Height:         Intake/Output Summary (Last 24 hours) at 05/03/2021 1207 Last data filed at May 03, 2021 0700 Gross per 24 hour  Intake 2459.25 ml  Output 400 ml  Net 2059.25 ml   Last 3 Weights 05/03/21 04/24/2021 04/17/2021  Weight (lbs) 210 lb 8.6 oz 182 lb 5.1 oz 185 lb  Weight (kg) 95.5 kg 82.7 kg 83.915 kg      Telemetry    Sinus tach - Personally Reviewed  ECG    N/a - Personally Reviewed  Physical Exam   GEN: intubated/sedated  Neck: No JVD Cardiac: RRR, no murmurs, rubs, or gallops.  Respiratory: Clear to auscultation bilaterally. GI: Soft, nontender, non-distended  MS: No edema; No deformity. Neuro:  Nonfocal  Psych: cannot assess, intubated/sedated  Labs    High Sensitivity Troponin:   Recent Labs  Lab 05/05/2021 0920 04/15/2021 1142  TROPONINIHS 4,631* 2,574*     Chemistry Recent Labs  Lab 04/07/2021 0920 04/13/2021 1936 04/24/21 0108 04/24/21 0246 04/24/21 1045 04/24/21 1110 May 03, 2021 0827 May 03, 2021 0853 05/03/2021 1149  NA 131*   < > 128*   < > 131*   < > 131* 131* 131*  K 3.7   < > 3.8   < > 4.1   < > 5.9* 5.7* 5.8*  CL 97*  --  97*  --  98  --  99  --   --   CO2 23  --  18*  --  22  --  11*  --   --   GLUCOSE 113*  --  252*  --  193*  --  98  --   --   BUN 25*  --  35*  --  40*  --  53*  --   --   CREATININE 1.05  --  1.95*  --  2.82*  --  4.87*  --   --   CALCIUM 9.2  --  7.7*  --  7.4*  --  7.1*  --   --   MG  --   --  1.9  --   --   --   --   --   --   PROT 7.5  --   --   --   --   --  5.1*  --   --   ALBUMIN 3.9  --   --   --   --   --  2.0*  --   --   AST 128*  --   --   --   --   --  5,036*  --   --   ALT 38  --   --   --   --   --  3,193*  --   --   ALKPHOS 50  --   --   --   --   --  68  --   --   BILITOT 1.1  --   --   --   --   --  1.3*  --   --   GFRNONAA >60  --  32*  --  21*  --  11*  --   --   ANIONGAP 11  --  13  --  11  --  21*  --   --    < > = values in this interval not displayed.    Lipids  Recent Labs  Lab 04/24/21 0108  TRIG  55    Hematology Recent Labs  Lab 04/14/2021 0920 04/19/2021 1936 04/24/21 0108 04/24/21 0246 29-Apr-2021 0853 2021/04/29 1140 04-29-21 1149  WBC 28.1*  --  25.8*  --   --  22.3*  --   RBC 4.46  --  4.40  --   --  3.91*  --   HGB 14.2   < > 14.0   < > 12.6* 12.5* 11.9*  HCT 43.8   < > 43.5   < > 37.0* 40.9 35.0*  MCV 98.2  --  98.9  --   --  104.6*  --   MCH 31.8  --  31.8  --   --  32.0  --   MCHC 32.4  --  32.2  --   --  30.6  --   RDW 13.1  --  13.2  --   --  13.8  --   PLT 236  --  238  --   --  174  --    < > = values in this interval not displayed.   Thyroid No results for input(s): TSH, FREET4 in the last 168 hours.  BNPNo results for input(s): BNP, PROBNP in the last 168 hours.  DDimer No results for input(s): DDIMER in the last 168 hours.   Radiology    DG Abd 1 View  Result Date: 04/08/2021 CLINICAL DATA:  Sepsis.  Nasogastric tube placement. EXAM:  ABDOMEN - 1 VIEW COMPARISON:  None. FINDINGS: Tip and side port of the nasogastric tube are in the stomach. Nonobstructive bowel gas pattern. IMPRESSION: Nasogastric tube tip in the stomach. Electronically Signed   By: Ulyses Jarred M.D.   On: 04/30/2021 19:33   CT HIP RIGHT W CONTRAST  Result Date: 04/22/2021 CLINICAL DATA:  Chronic hip pain. Patient found on floor although claims did not fall. EXAM: CT OF THE LOWER RIGHT EXTREMITY WITH CONTRAST TECHNIQUE: Multidetector CT imaging of the lower right extremity was performed according to the standard protocol following intravenous contrast administration. CONTRAST:  18m OMNIPAQUE IOHEXOL 300 MG/ML  SOLN COMPARISON:  Multiple exams, including radiographs 04/29/2021 and MRI 11/04/2020 FINDINGS: Bones/Joint/Cartilage Right SI joint unremarkable. Severe arthropathy of the right hip with full-thickness loss of craniocaudad articular cartilage and substantial acetabular and femoral head spurring. 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 Anterior to the femoral neck we demonstrate a well corticated 2.7 by 0.8 by 1.9 cm free fragment which likely represents a chronically fragmented osteophyte. There is another similar anterior free fragment along the joint capsule anterior to the femoral head measuring 1.0 by 0.7 by 1.0 cm. Other smaller potential free fragments are present along the posterior acetabular rim. No acute fracture the right proximal femur or regional pelvis identified. There is evidence of spondylosis at L5-S1. No significant right SI joint abnormality. Small right hip joint effusion fairly similar to the 11/04/2020 exam. No significant degree of enhancing synovitis is identified. Ligaments Suboptimally assessed by CT. Muscles and Tendons Unremarkable Soft tissues Sigmoid colon diverticulosis. There is a suggestion of a small amount of ascites in the right lower paracolic gutter region, not readily apparent on the MRI from 11/04/2020 Iliac artery atherosclerotic calcification is identified. Right external iliac node 0.9 cm in short axis on image 52 series 3, along with a similar borderline prominent right inguinal lymph node on image 56 series 3. Mild prostatomegaly. IMPRESSION: 1. Severe degenerative arthropathy of the right hip with a small hip effusion and at least 2 small free osteochondral fragments loose in the joint. Similar findings were shown on the MRI from 11/04/2020. 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 2. There is evidence of spondylosis at L5-S1. 3. Sigmoid colon diverticulosis. 4. Trace amount of ascites along the right lower paracolic gutter nonspecific. 5. Mild prostatomegaly. 6. Two upper normal sized right external iliac lymph nodes. Likely incidental but potentially mildly reactive. Electronically Signed   By: WVan ClinesM.D.   On: 04/13/2021 16:38   Portable Chest xray  Result  Date: 04/24/2021 CLINICAL DATA:  Endotracheal tube placement.  Ventilator dependence. EXAM: PORTABLE CHEST 1 VIEW COMPARISON:  04/10/2021 FINDINGS: 0415 hours. Endotracheal tube tip is 6.9 cm above the base of the carina. The NG tube passes into the stomach although the distal tip position is not included on the film. Left IJ central line tip overlies the mid SVC level. Diffuse right and basilar left airspace disease is similar to prior. No evidence for pneumothorax. Small bilateral pleural effusions evident. The cardiopericardial silhouette is within normal limits for size. Telemetry leads overlie the chest. IMPRESSION: 1. No substantial change asymmetric bilateral airspace disease, right greater than left. 2. Small bilateral pleural effusions. Electronically Signed   By: EMisty StanleyM.D.   On: 04/24/2021 09:51   DG CHEST PORT 1 VIEW  Result Date: 04/06/2021 CLINICAL  DATA:  Central line placement EXAM: PORTABLE CHEST 1 VIEW COMPARISON:  04/15/2021 FINDINGS: Single frontal view of the chest demonstrates stable endotracheal tube and enteric catheter. Interval placement of a left internal jugular central venous catheter tip overlying superior vena cava. External defibrillator pads again seen overlying left chest. Cardiac silhouette is stable. Progressive bilateral airspace disease, right greater than left. Small right pleural effusion has developed. No pneumothorax. IMPRESSION: 1. No complication after left internal jugular catheter placement. 2. Progressive bilateral airspace disease and developing right pleural effusion. Electronically Signed   By: Randa Ngo M.D.   On: 04/17/2021 21:01   Portable Chest x-ray  Result Date: 04/13/2021 CLINICAL DATA:  NG placement. EXAM: PORTABLE CHEST 1 VIEW COMPARISON:  Earlier radiograph dated 05/02/2021. FINDINGS: Endotracheal tube with tip approximately 6.7 cm above the carina. Enteric tube extends below the diaphragm with tip in the body of the stomach. Interval  worsening of asymmetric bilateral pulmonary opacities, right greater than left, since the prior radiograph. No pleural effusion pneumothorax. Stable cardiac silhouette. No acute osseous pathology. IMPRESSION: 1. Endotracheal tube with tip above the carina. Enteric tube in the body of the stomach. 2. Interval worsening of asymmetric bilateral pulmonary opacities, right greater than left. Electronically Signed   By: Anner Crete M.D.   On: 04/14/2021 19:29   DG Chest Port 1 View  Result Date: 05/02/2021 CLINICAL DATA:  Tachypnea and hypoxia. EXAM: PORTABLE CHEST 1 VIEW COMPARISON:  April 23, 2021 FINDINGS: The lungs are hyperinflated. Mild to moderate severity airspace disease is seen throughout the right lung. This is slightly more prominent within the right lung base. Mild left basilar atelectasis is noted. There is no evidence of a pleural effusion or pneumothorax. The cardiac silhouette is mildly enlarged. The visualized skeletal structures are unremarkable. IMPRESSION: 1. Mild to moderate severity right lung airspace disease, consistent with areas of atelectasis and/or infiltrate. 2. Mild left basilar atelectasis. Electronically Signed   By: Virgina Norfolk M.D.   On: 04/22/2021 18:47   ECHOCARDIOGRAM COMPLETE  Result Date: 04/24/2021    ECHOCARDIOGRAM REPORT   Patient Name:   ANDREA COLGLAZIER Date of Exam: 04/24/2021 Medical Rec #:  [id number]        Height:       73.0 in Accession #:    [id number]       Weight:       182.3 lb Date of Birth:  [birth year]        BSA:          2.069 m Patient Age:    7 years         BP:           107/74 mmHg Patient Gender: M                HR:           100 bpm. Exam Location:  Inpatient Procedure: 2D Echo, Color Doppler and Cardiac Doppler Indications:    R94.31 Abnormal EKG  History:        Patient has prior history of Echocardiogram examinations, most                 recent 03/30/2020. CAD; Risk Factors:Hypertension.  Sonographer:    Raquel Sarna Senior RDCS  Referring Phys: [id number] Estill Cotta  Sonographer Comments: Extremely difficult study, scanned by 2 different techs, very difficult to separate mitral and aortic valve signals. IMPRESSIONS  1. Technically difficult; aortic valve not well visualized but appears calcified; mean gradient  of 31 mmHg suggests moderate AS; possible oscillating density on mitral valve; MR not well interrogated but appears to be mild; suggest TEE if clinically indicated.  2. Left ventricular ejection fraction, by estimation, is 60 to 65%. The left ventricle has normal function. The left ventricle has no regional wall motion abnormalities. There is mild left ventricular hypertrophy. Left ventricular diastolic parameters were normal.  3. Right ventricular systolic function is normal. The right ventricular size is normal. There is moderately elevated pulmonary artery systolic pressure.  4. Left atrial size was mildly dilated.  5. The mitral valve is normal in structure. Mild mitral valve regurgitation. No evidence of mitral stenosis.  6. The aortic valve has an indeterminant number of cusps. Aortic valve regurgitation is not visualized. Moderate aortic valve stenosis.  7. The inferior vena cava is normal in size with <50% respiratory variability, suggesting right atrial pressure of 8 mmHg. FINDINGS  Left Ventricle: Left ventricular ejection fraction, by estimation, is 60 to 65%. The left ventricle has normal function. The left ventricle has no regional wall motion abnormalities. The left ventricular internal cavity size was normal in size. There is  mild left ventricular hypertrophy. Left ventricular diastolic parameters were normal. Right Ventricle: The right ventricular size is normal. Right ventricular systolic function is normal. There is moderately elevated pulmonary artery systolic pressure. The tricuspid regurgitant velocity is 3.27 m/s, and with an assumed right atrial pressure of 8 mmHg, the estimated right ventricular systolic  pressure is 91.5 mmHg. Left Atrium: Left atrial size was mildly dilated. Right Atrium: Right atrial size was normal in size. Pericardium: There is no evidence of pericardial effusion. Mitral Valve: The mitral valve is normal in structure. Mild mitral annular calcification. Mild mitral valve regurgitation. No evidence of mitral valve stenosis. Tricuspid Valve: The tricuspid valve is normal in structure. Tricuspid valve regurgitation is trivial. No evidence of tricuspid stenosis. Aortic Valve: The aortic valve has an indeterminant number of cusps. Aortic valve regurgitation is not visualized. Moderate aortic stenosis is present. Aortic valve mean gradient measures 31.0 mmHg. Aortic valve peak gradient measures 53.3 mmHg. Aortic valve area, by VTI measures 0.57 cm. Pulmonic Valve: The pulmonic valve was not well visualized. Pulmonic valve regurgitation is not visualized. No evidence of pulmonic stenosis. Aorta: The aortic root is normal in size and structure. Venous: The inferior vena cava is normal in size with less than 50% respiratory variability, suggesting right atrial pressure of 8 mmHg. IAS/Shunts: No atrial level shunt detected by color flow Doppler. Additional Comments: Technically difficult; aortic valve not well visualized but appears calcified; mean gradient of 31 mmHg suggests moderate AS; possible oscillating density on mitral valve; MR not well interrogated but appears to be mild; suggest TEE if clinically indicated.  LEFT VENTRICLE PLAX 2D LVIDd:         4.70 cm LVIDs:         3.90 cm LV PW:         0.80 cm LV IVS:        1.10 cm LVOT diam:     2.20 cm LV SV:         35 LV SV Index:   17 LVOT Area:     3.80 cm  RIGHT VENTRICLE RV S prime:     13.50 cm/s TAPSE (M-mode): 2.2 cm LEFT ATRIUM           Index        RIGHT ATRIUM  Index LA diam:      4.80 cm 2.32 cm/m   RA Area:     16.70 cm LA Vol (A2C): 81.4 ml 39.35 ml/m  RA Volume:   43.60 ml  21.08 ml/m LA Vol (A4C): 63.9 ml 30.89 ml/m   AORTIC VALVE AV Area (Vmax):    0.60 cm AV Area (Vmean):   0.65 cm AV Area (VTI):     0.57 cm AV Vmax:           365.00 cm/s AV Vmean:          259.000 cm/s AV VTI:            0.609 m AV Peak Grad:      53.3 mmHg AV Mean Grad:      31.0 mmHg LVOT Vmax:         57.80 cm/s LVOT Vmean:        44.100 cm/s LVOT VTI:          0.091 m LVOT/AV VTI ratio: 0.15  AORTA Ao Root diam: 3.00 cm Ao Asc diam:  3.50 cm TRICUSPID VALVE TR Peak grad:   42.8 mmHg TR Vmax:        327.00 cm/s  SHUNTS Systemic VTI:  0.09 m Systemic Diam: 2.20 cm Kirk Ruths MD Electronically signed by Kirk Ruths MD Signature Date/Time: 04/24/2021/2:05:27 PM    Final    VAS [place] LOWER EXTREMITY VENOUS (DVT)  Result Date: 04/24/2021  Lower Venous DVT Study Patient Name:  JUNAH YAM  Date of Exam:   04/09/2021 Medical Rec #: [id number]         Accession #:    [id number] Date of Birth: 09-28-1930         Patient Gender: M Patient Age:   33 years Exam Location:  [facility] Procedure:      VAS [place] LOWER EXTREMITY VENOUS (DVT) Referring Phys: [facility] PFEIFFER --------------------------------------------------------------------------------  Indications: Stroke.  Comparison Study: no prior Performing Technologist: Archie Patten RVS  Examination Guidelines: A complete evaluation includes B-mode imaging, spectral Doppler, color Doppler, and power Doppler as needed of all accessible portions of each vessel. Bilateral testing is considered an integral part of a complete examination. Limited examinations for reoccurring indications may be performed as noted. The reflux portion of the exam is performed with the patient in reverse Trendelenburg.  +---------+---------------+---------+-----------+----------+--------------+ RIGHT    CompressibilityPhasicitySpontaneityPropertiesThrombus Aging +---------+---------------+---------+-----------+----------+--------------+ CFV      Full           Yes      Yes                                  +---------+---------------+---------+-----------+----------+--------------+ SFJ      Full                                                        +---------+---------------+---------+-----------+----------+--------------+ FV Prox  Full                                                        +---------+---------------+---------+-----------+----------+--------------+ FV Mid   Full                                                        +---------+---------------+---------+-----------+----------+--------------+  FV DistalFull                                                        +---------+---------------+---------+-----------+----------+--------------+ PFV      Full                                                        +---------+---------------+---------+-----------+----------+--------------+ POP      Full           Yes      Yes                                 +---------+---------------+---------+-----------+----------+--------------+ PTV      Full                                                        +---------+---------------+---------+-----------+----------+--------------+ PERO     Full                                                        +---------+---------------+---------+-----------+----------+--------------+   +----+---------------+---------+-----------+----------+--------------+ LEFTCompressibilityPhasicitySpontaneityPropertiesThrombus Aging +----+---------------+---------+-----------+----------+--------------+ CFV Full           Yes      Yes                                 +----+---------------+---------+-----------+----------+--------------+     Summary: RIGHT: - There is no evidence of deep vein thrombosis in the lower extremity.  - No cystic structure found in the popliteal fossa.  LEFT: - No evidence of common femoral vein obstruction.  *See table(s) above for measurements and observations. Electronically signed by Jamelle Haring on  04/24/2021 at 11:07:56 AM.    Final     Cardiac Studies     Patient Profile     BAIN WHICHARD is a 87 y.o. male with a hx of CAD s/p DES to mid RCA and proximal/mid LAD in October 2020 when with residual circumflex disease, hypertension, hyperlipidemia who is being seen 04/07/2021 for the evaluation of Elevated troponin at the request of Dr. Johnney Killian.   Admitted 03/2020 after syncope.  Stress test was abnormal.  Cardiac cath showed multivessel CAD.  Patient declined CABG.  Underwent successful PCI/DESx1 to mRCA, and DESx1 to p/mLAD using IVUS. Plan for DAPT with ASA/plavix for at least one year. Plan to treat Lcx disease medically. Patient wanted to stay on Zocor.     Assessment & Plan      Elevated troponin/History of CAD - s/p DES to RCA and LAD 03/2020 - Hs-troponin 4631>> 2574 in setting of being down for unknown period of time with elevated lactic acid, WBC and CK total - -Patient does have residual disease in circumflex arteries -EKG this admission  did showed ST depression in lead V4 to V6 - echo difficult visualizaiton but EG 60-65%, normal RV function, mod AS   - suspect NSTEMI due to severe demand in setting of chronic obstructive disease. Paitent presented with sepsis, respiratory failiure, lactic acidosis, rhabdo. Admitted after being found down on the floor unknown amount of time.    - medical therapy at this time with hep gtt. OG is to suction, on  PR aspirin.  - no plans for cath at this time in setting of advnaced age, septic shock, renal failure, liver failure   2. Respiratory failure - intubated, vent management per critical care - difficult oxygenating patient, ABG on 100%/PRVC 24/TV 650/P 14 = 7.256/30.4/67/13.6     3. Pnemonia - with respiratory failure, intubated - s/p bronc with RML and RLLL aspiraiton   4.Septic Shock/multiorgan failure - septic shock, coox would suggest also cardiac component though LV and RV function were normal on echo. Difficult to  assess AS by echo, was moderate by gradient alone.  cardiogenic component, we are repeating.  - peak lactic acid 6.6,procalc 19, WBC in the 20s - epi 8 mcg/min, levo 40 mcg/min, vasopressin 0.03units/min. Continue pressors, repeat coox. With echo normal  RV and LV function have not started specific inotrope at this time despite low coox yesterday, getting plenty of Beta 1 activation from NE and epi.    5. AKI - in setting of hypotension, shock. Cr up to 4.9  6. Aortic stenosis - difficult echo visualization but suggests moderate AS by mean gradient 31 mmHg. AVA VTI 0.6 and DI 0.15 but difficult study. Would plan for repeat study once extubated pending clinical course, possibly TEE  7. Possible MV density - very difficult visualization, limited study once extubated vs TEE pending clinical course   8. Goals of care - from critical care note patient limited DNR with no chest compressions, family does not want aggressive treatment such as dialysis   For questions or updates, please contact [facility] Please consult [email or web address] for contact info under        Signed, Carlyle Dolly, MD  05-25-2021, 12:07 PM

## 2021-05-06 NOTE — Progress Notes (Addendum)
NAME:  Gary Riggs, MRN:  [id number], DOB:  1930-09-26, LOS: 1 ADMISSION DATE:  05/02/2021, CONSULTATION DATE:  04/13/2021 REFERRING MD:  Dyanne Iha - TRH, CHIEF COMPLAINT:  respiratory distress     History of Present Illness:  Gary Riggs, is a 87 y.o. male, who presented to the [facility] ED with a chief complaint of AMS.   He developed AMS and new confusion on 11/17 per daughter. Neighbors checked on him on the AM of 11/18 and he was found lying in the floor. EMS was called. ED course was notable for a rising lactate 2.6>6.6, CK 1700, NA 131, COVID and FLU -, room air., WBC 28k. IV antibiotics were started per sepsis protocol. Cardiology was consulted for an EKG with lateral ST depressions. Troponin [id number]. A heparin GTT was started. Hospitalists intented to admit the patient. However he developed respiratory distress while in the ED. CXR demonstrated a large left infiltrated. He was intubated and bronched in the the ED. PCCM was consulted for assistance with acute respiratory failure.    Overnight, patient required 3 pressors to maintain BP. Vanc and unasyn continued. Blood culture positive for Strep agalactiae.    Pertinent  Medical History  CAD  BPH Hypothyroidism DJD HTN Thyroid disease    Significant Hospital Events: Including procedures, antibiotic start and stop dates in addition to other pertinent events   11/18 to ED after found down. Admitted to [facility] with metabolic encephalopathy, suspected severe sepsis, mild rhabdo. Developed NSTEMI in ED. Then significant and abrupt resp failure requiring CCM consult.  11/19: DNR-limited (no compressions). Blood culture positive for Strep agalactiae   Interim History / Subjective:  Patient intubated and sedated. No acute distress. Daughter at bedside, all questions answered.    Objective   Blood pressure 107/74, pulse (!) 104, temperature 99 F (37.2 C), temperature source Oral, resp. rate (!) 24, height 6\' 1"  (1.854 m), weight 82.7 kg,  SpO2 96 %. CVP:  [5 mmHg-14 mmHg] 5 mmHg   >  Vent Mode: PRVC FiO2 (%):  [100 %] 100 % Set Rate:  [24 bmp-28 bmp] 24 bmp Vt Set:  [650 mL] 650 mL PEEP:  [5 cmH20-10 cmH20] 5 cmH20 Plateau Pressure:  [24 cmH20-29 cmH20] 28 cmH20      Intake/Output Summary (Last 24 hours) at 04/24/2021 1227 Last data filed at 04/24/2021 1100    Gross per 24 hour  Intake 5228.76 ml  Output 0 ml  Net 5228.76 ml        Filed Weights    04/30/2021 0816 04/24/21 0500  Weight: 83.9 kg 82.7 kg      Examination: General: Critically ill, thin appearing male, intubated, sedated Cardiac: RRR, no murmur Resp: rales in bilateral lateral fields Abd: non-distended, soft Ext: cool feet, palpable pre-tibial pulses   Resolved Hospital Problem list   None at this time   Assessment & Plan:    Acute metabolic encephalopathy  Currently intubated and sedated. Requiring FiO2 100%.  - Correct electrolytes PRN   Acute respiratory failure with hypoxemia  ARDS  Aspiration PNA Continues to required 100% FiO2.  - continue vanc and unasyn - RVP - VAP   Septic shock Currently requiring Precedex, epi, Levophed, vasopressin, and stress dose hydrocortisone to maintain pressures.  Sensitivities on blood culture show his Strep Agalactiae is sensitive to vancomycin.  - continue vanc and unasyn -Continue hydrocortisone IV 100 mg BID  - MAP goal 65  Hepatitis secondary to shock liver This morning AST 5036, up from 128 on  11/18.  ALT 2193, up from 38 on 11/18.   Hyperkalemia Potassium 5.9 this morning on a.m. labs.  Obtaining EKG now. - Calcium gluconate x1 - Sodium bicarb x1  Demand ischemia  Hx CAD Echo performed yesterday demonstrates EF 60-65%, mild LV hypertrophy, moderately elevated pulm artery systolic pressure, moderate dilation LA.   Right axillary DVT Continue full dose heparin.    Lactic acidosis Downtrending.  Last lactic acid 3.6 yesterday afternoon.   Rhabdomyolysis  CK 1,733. No further  rechecks. Will continue with aggressive hydration.    Acute renal failure 400 of urine output last 24 hours.  Creatinine 4.87, up from 2.8 yesterday. - daily CMP   Hyponatremia Sodium 131.  Continue to monitor. - AM BMP   Best Practice (right click and "Reselect all SmartList Selections" daily)    Diet/type: NPO DVT prophylaxis: systemic heparin GI prophylaxis: PPI Lines: N/A Foley:  N/A Code Status:  full code Last date of multidisciplinary goals of care discussion [11/19 - DNR limited, no compressions, no HD]     Ezequiel Essex, MD Family Medicine PGY-2 [place] for pager  04/24/2021, 12:27 PM

## 2021-05-06 DEATH — deceased

## 2021-11-11 IMAGING — DX DG HIP (WITH OR WITHOUT PELVIS) 2-3V*R*
2 series · 2 of 2 positions shown · non-contrast
Comparison: None.

CLINICAL DATA: Right hip pain

EXAM:
DG HIP (WITH OR WITHOUT PELVIS) 2-3V RIGHT

[dg hip unilat w or w/o pelvis 2-3 views  (1 of 2)]
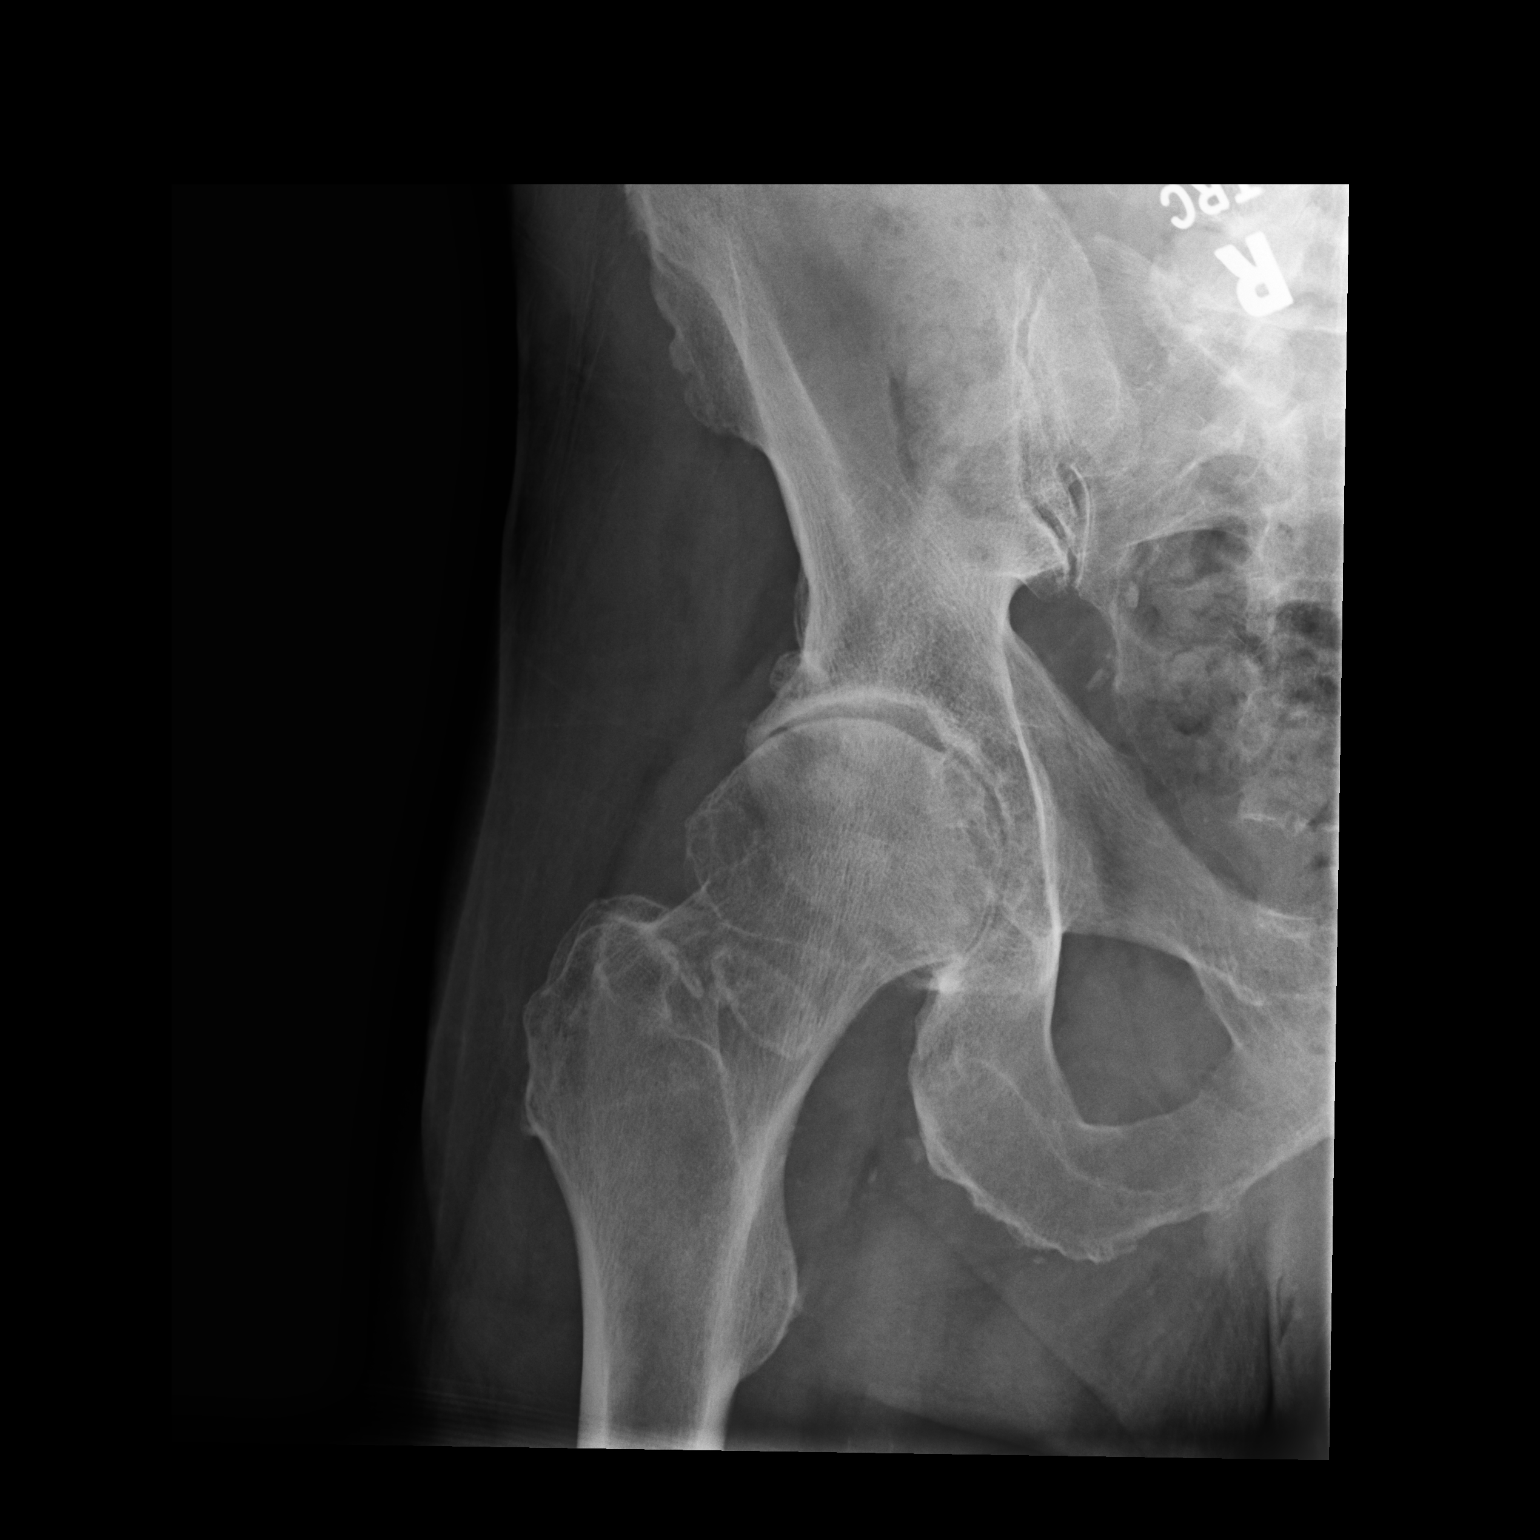

[dg hip unilat w or w/o pelvis 2-3 views  (2 of 2)]
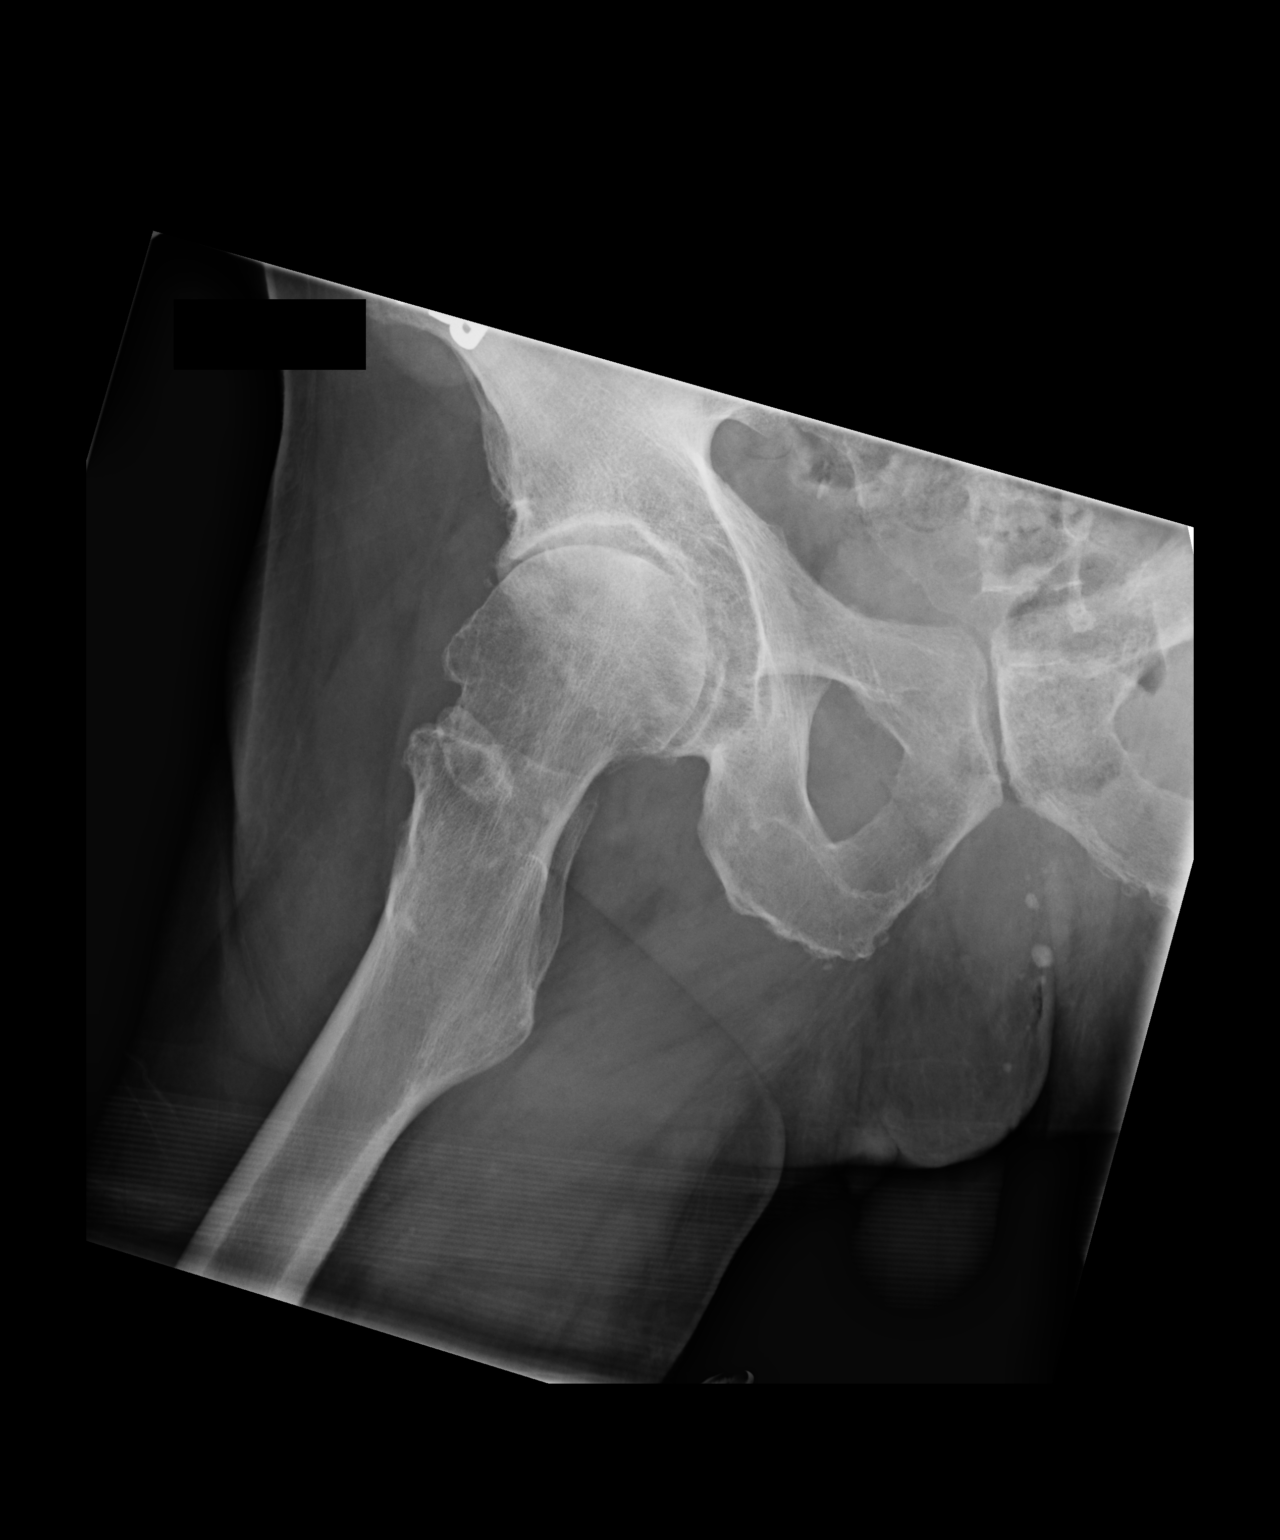

[2 of 2 positions shown; findings below may reference images not displayed]

FINDINGS: Frontal and frogleg lateral views of the right hip demonstrates
severe right hip osteoarthritis with marked joint space narrowing
and marginal osteophyte formation. There is bony remodeling of the
femoral head. No acute fracture, subluxation, or dislocation. Soft
tissues are unremarkable.
IMPRESSION: 1. Severe right hip osteoarthritis.
